# Patient Record
Sex: Female | Born: 1945 | Race: Black or African American | Hispanic: No | Marital: Single | Smoking: Never smoker
Health system: Southern US, Community
[De-identification: ages and names within clinical notes are randomized; demographics above are authoritative.]

## PROBLEM LIST (undated history)

## (undated) DIAGNOSIS — E119 Type 2 diabetes mellitus without complications: Secondary | ICD-10-CM

## (undated) DIAGNOSIS — Z86718 Personal history of other venous thrombosis and embolism: Secondary | ICD-10-CM

## (undated) DIAGNOSIS — I10 Essential (primary) hypertension: Secondary | ICD-10-CM

## (undated) HISTORY — PX: TUBAL LIGATION: SHX77

---

## 2010-01-23 IMAGING — US US SOFT TISSUE HEAD/NECK
1 series · 13 of 25 positions shown · non-contrast
Comparison: [HOSPITAL] at [HOSPITAL] thyroid
ultrasound 06/16/2008.

CLINICAL DATA: Follow-up thyroid nodule

THYROID ULTRASOUND
TECHNIQUE: Ultrasound examination of the thyroid gland and
adjacent soft tissues was performed.

[Series 1: us soft tissue head/neck · 0.07mm/px · 13 of 27 slices shown]
[im 1/27]
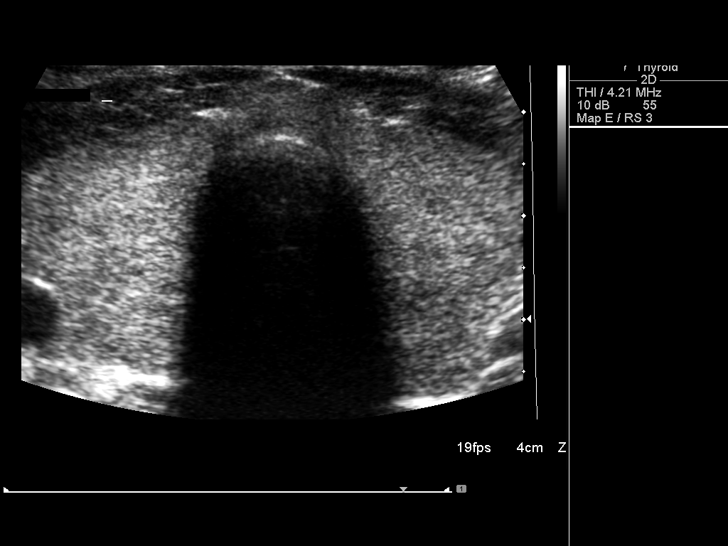
[im 3/27]
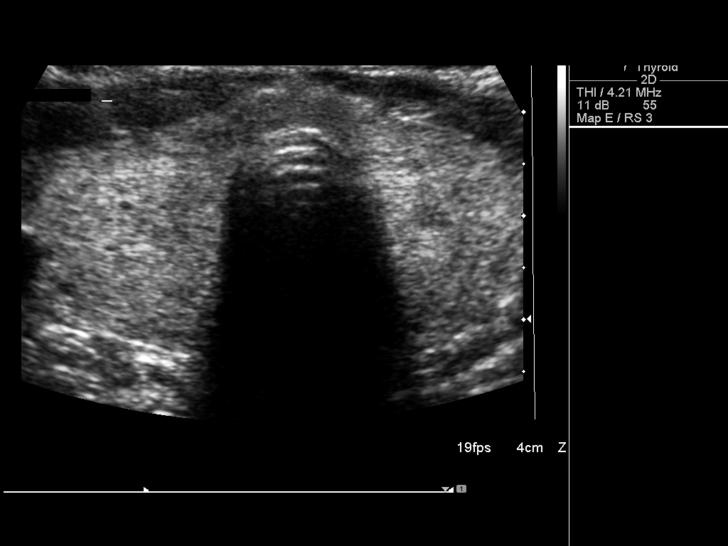
[im 5/27]
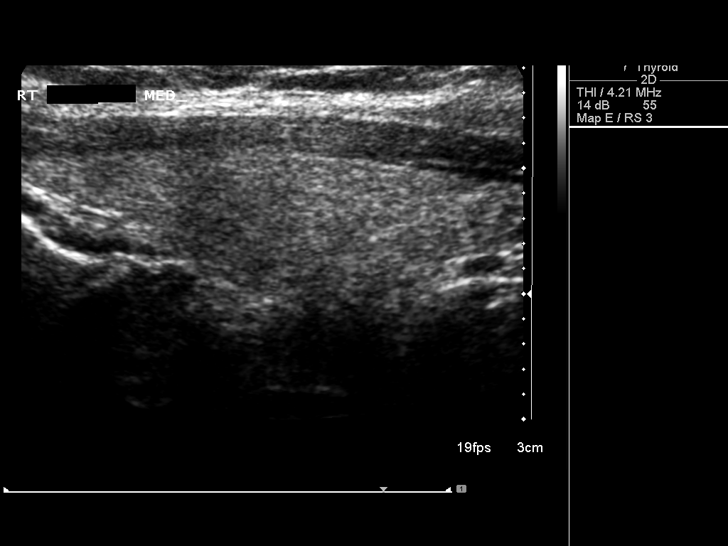
[im 7/27]
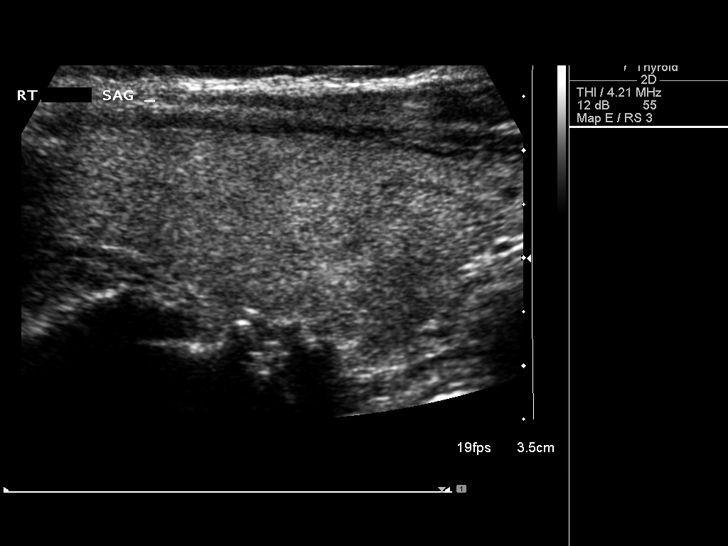
[im 9/27]
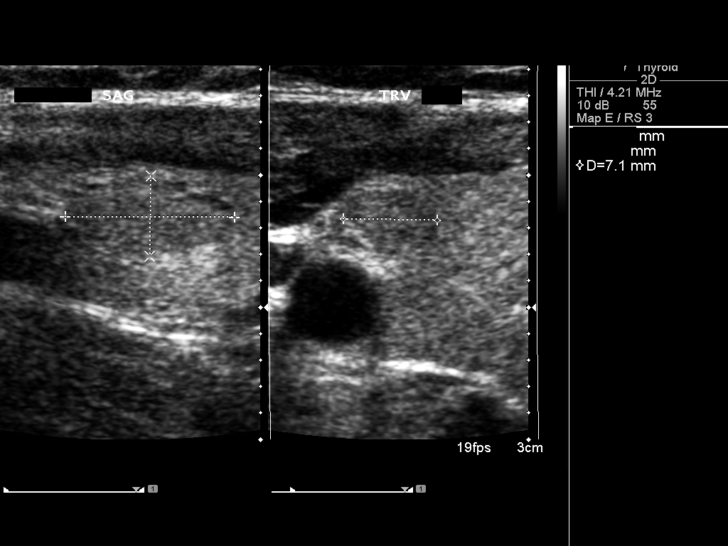
[im 11/27]
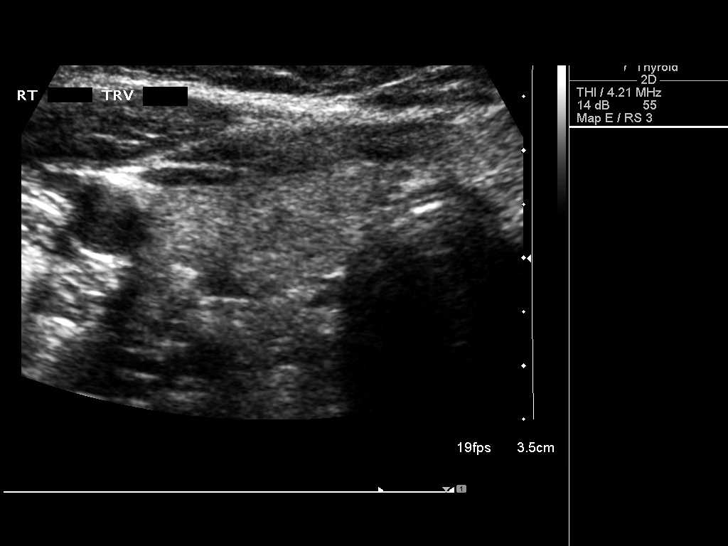
[im 14/27]
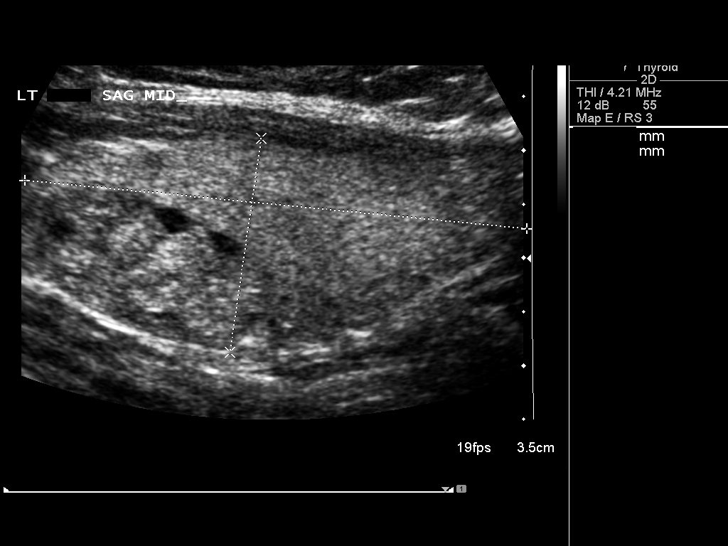
[im 16/27]
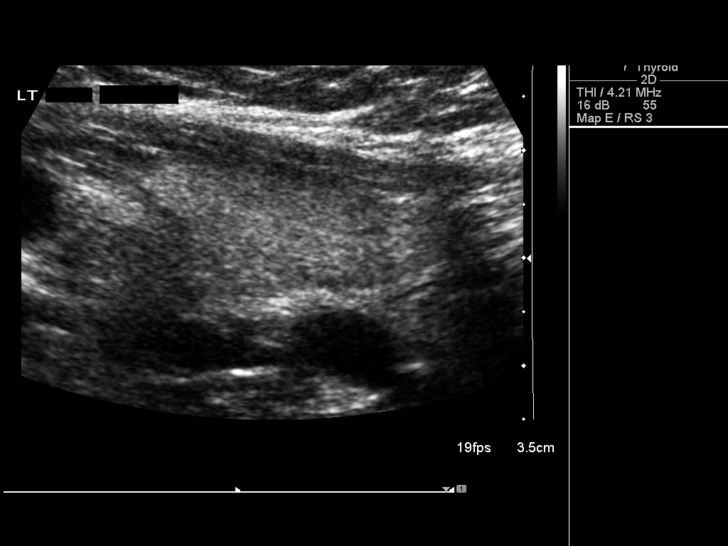
[im 18/27]
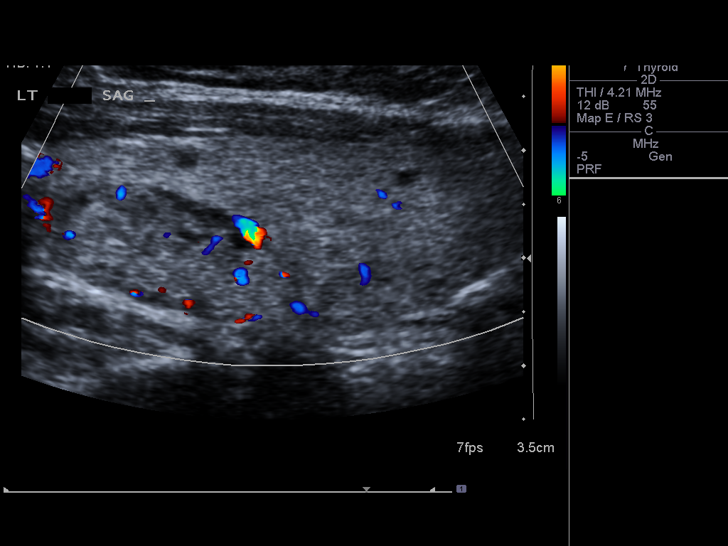
[im 20/27]
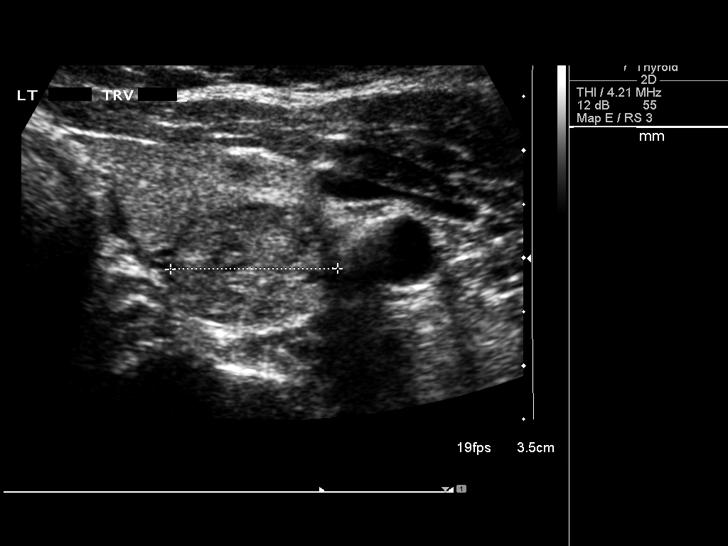
[im 22/27]
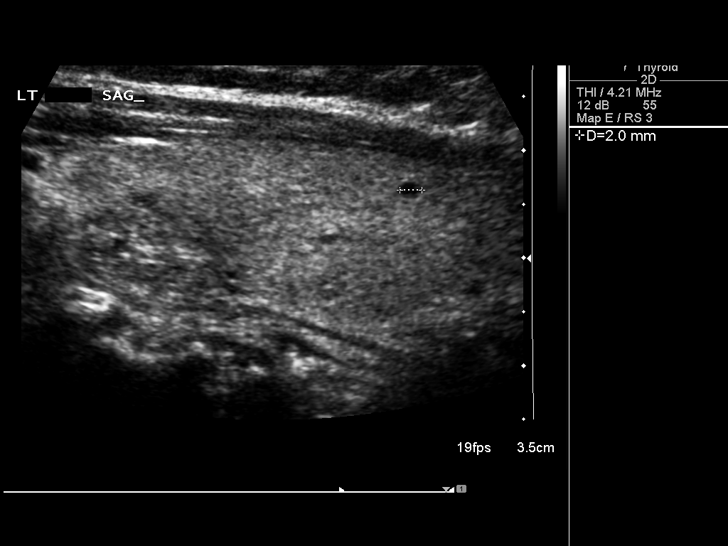
[im 24/27]
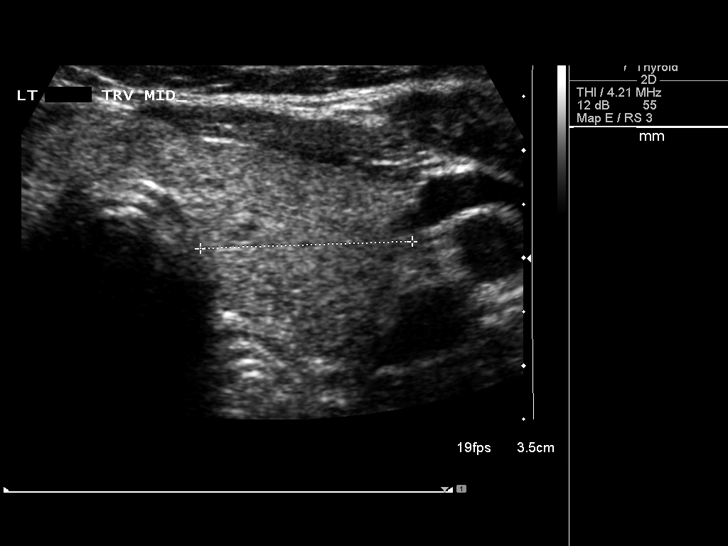
[im 27/27]
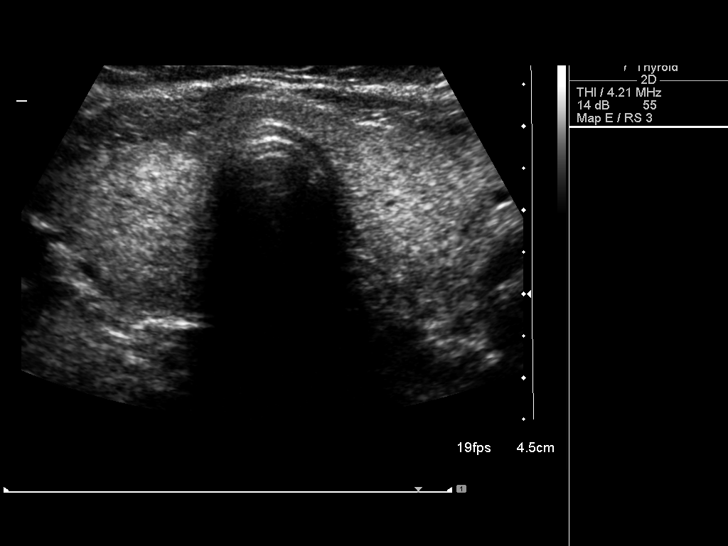

[13 of 25 positions shown; findings below may reference images not displayed]

FINDINGS: Thyroid gland remains stable and upper limits of normal
in size with diffuse heterogeneous echotexture.  Right lobe
measures 5.2 cm long X 1.6 cm AP X 2.2 cm wide (06/16/2008 5.1 X
1.8 X 2.2 cm).  Left lobe currently measures 4.7 cm long X 2.0 cm
AP X 2.0 cm wide (06/16/2008 4.7 X 2.0 X 2.0 cm.  Isthmus measures
5 mm AP (previous 4 mm).  The dominant upper pole left lobe thyroid
nodule measures 2.7 cm long X 1.1 cm AP X 1.6 cm wide (06/16/2008
2.1 X 1.0 X 1.5 cm).   At the mid right lobe thyroid is interval
solid nodule measuring 1.3 cm long X 0.6 cm AP X 0.7 cm wide with
previous 5 mm right lobe cystic nodule currently not identified.
IMPRESSION: 1.  Heterogeneous thyroid gland upper limits of normal in size
consistent with chronic thyroiditis - need clinical correlation.
2.  Essentially stable left lobe thyroid solid nodule with interval
development solid nodule right lobe as described.  Recommend follow-
up thyroid ultrasound in 1 year as clinically indicated

## 2022-07-23 ENCOUNTER — Inpatient Hospital Stay
Admission: EM | Admit: 2022-07-23 | Discharge: 2022-08-15 | DRG: 872 | Disposition: A | Discharge status: Home Health | Payer: Medicare HMO | Attending: Internal Medicine | Admitting: Internal Medicine

## 2022-07-23 ENCOUNTER — Emergency Department: Payer: Medicare HMO

## 2022-07-23 DIAGNOSIS — E785 Hyperlipidemia, unspecified: Secondary | ICD-10-CM | POA: Diagnosis present

## 2022-07-23 DIAGNOSIS — K219 Gastro-esophageal reflux disease without esophagitis: Secondary | ICD-10-CM | POA: Diagnosis present

## 2022-07-23 DIAGNOSIS — Z5986 Financial insecurity: Secondary | ICD-10-CM

## 2022-07-23 DIAGNOSIS — I10 Essential (primary) hypertension: Secondary | ICD-10-CM | POA: Diagnosis present

## 2022-07-23 DIAGNOSIS — K220 Achalasia of cardia: Secondary | ICD-10-CM | POA: Diagnosis present

## 2022-07-23 DIAGNOSIS — E1165 Type 2 diabetes mellitus with hyperglycemia: Secondary | ICD-10-CM | POA: Diagnosis present

## 2022-07-23 DIAGNOSIS — L8992 Pressure ulcer of unspecified site, stage 2: Secondary | ICD-10-CM | POA: Diagnosis present

## 2022-07-23 DIAGNOSIS — N390 Urinary tract infection, site not specified: Secondary | ICD-10-CM | POA: Diagnosis present

## 2022-07-23 DIAGNOSIS — G8929 Other chronic pain: Secondary | ICD-10-CM | POA: Diagnosis present

## 2022-07-23 DIAGNOSIS — D6859 Other primary thrombophilia: Secondary | ICD-10-CM | POA: Diagnosis present

## 2022-07-23 DIAGNOSIS — Z79899 Other long term (current) drug therapy: Secondary | ICD-10-CM

## 2022-07-23 DIAGNOSIS — E440 Moderate protein-calorie malnutrition: Secondary | ICD-10-CM | POA: Diagnosis present

## 2022-07-23 DIAGNOSIS — I214 Non-ST elevation (NSTEMI) myocardial infarction: Secondary | ICD-10-CM | POA: Diagnosis present

## 2022-07-23 DIAGNOSIS — Z86711 Personal history of pulmonary embolism: Secondary | ICD-10-CM

## 2022-07-23 DIAGNOSIS — E876 Hypokalemia: Secondary | ICD-10-CM | POA: Diagnosis present

## 2022-07-23 DIAGNOSIS — R627 Adult failure to thrive: Secondary | ICD-10-CM

## 2022-07-23 DIAGNOSIS — Z1152 Encounter for screening for COVID-19: Secondary | ICD-10-CM

## 2022-07-23 DIAGNOSIS — E1140 Type 2 diabetes mellitus with diabetic neuropathy, unspecified: Secondary | ICD-10-CM | POA: Diagnosis present

## 2022-07-23 DIAGNOSIS — I2489 Other forms of acute ischemic heart disease: Secondary | ICD-10-CM | POA: Diagnosis present

## 2022-07-23 DIAGNOSIS — R000 Tachycardia, unspecified: Secondary | ICD-10-CM

## 2022-07-23 DIAGNOSIS — A419 Sepsis, unspecified organism: Principal | ICD-10-CM | POA: Diagnosis present

## 2022-07-23 DIAGNOSIS — Z86718 Personal history of other venous thrombosis and embolism: Secondary | ICD-10-CM

## 2022-07-23 DIAGNOSIS — I82452 Acute embolism and thrombosis of left peroneal vein: Secondary | ICD-10-CM | POA: Diagnosis present

## 2022-07-23 DIAGNOSIS — K5900 Constipation, unspecified: Secondary | ICD-10-CM | POA: Diagnosis present

## 2022-07-23 DIAGNOSIS — N210 Calculus in bladder: Secondary | ICD-10-CM | POA: Diagnosis present

## 2022-07-23 DIAGNOSIS — Z7982 Long term (current) use of aspirin: Secondary | ICD-10-CM

## 2022-07-23 DIAGNOSIS — Z7401 Bed confinement status: Secondary | ICD-10-CM

## 2022-07-23 DIAGNOSIS — I071 Rheumatic tricuspid insufficiency: Secondary | ICD-10-CM | POA: Diagnosis present

## 2022-07-23 DIAGNOSIS — D531 Other megaloblastic anemias, not elsewhere classified: Secondary | ICD-10-CM | POA: Diagnosis present

## 2022-07-23 DIAGNOSIS — I82412 Acute embolism and thrombosis of left femoral vein: Secondary | ICD-10-CM | POA: Diagnosis present

## 2022-07-23 DIAGNOSIS — L89899 Pressure ulcer of other site, unspecified stage: Secondary | ICD-10-CM

## 2022-07-23 DIAGNOSIS — M1990 Unspecified osteoarthritis, unspecified site: Secondary | ICD-10-CM | POA: Diagnosis present

## 2022-07-23 DIAGNOSIS — I82432 Acute embolism and thrombosis of left popliteal vein: Secondary | ICD-10-CM | POA: Diagnosis present

## 2022-07-23 DIAGNOSIS — N200 Calculus of kidney: Secondary | ICD-10-CM | POA: Diagnosis present

## 2022-07-23 DIAGNOSIS — R32 Unspecified urinary incontinence: Secondary | ICD-10-CM

## 2022-07-23 DIAGNOSIS — Z7901 Long term (current) use of anticoagulants: Secondary | ICD-10-CM

## 2022-07-23 DIAGNOSIS — I2510 Atherosclerotic heart disease of native coronary artery without angina pectoris: Secondary | ICD-10-CM | POA: Diagnosis present

## 2022-07-23 DIAGNOSIS — I452 Bifascicular block: Secondary | ICD-10-CM | POA: Diagnosis present

## 2022-07-23 DIAGNOSIS — Z6841 Body Mass Index (BMI) 40.0 and over, adult: Secondary | ICD-10-CM

## 2022-07-23 HISTORY — DX: Type 2 diabetes mellitus without complications: E11.9

## 2022-07-23 HISTORY — DX: Essential (primary) hypertension: I10

## 2022-07-23 HISTORY — DX: Personal history of other venous thrombosis and embolism: Z86.718

## 2022-07-23 LAB — CBC AND DIFFERENTIAL
Absolute NRBC: 0.02 10*3/uL — ABNORMAL HIGH (ref 0.00–0.00)
Basophils Absolute Automated: 0.03 10*3/uL (ref 0.00–0.08)
Basophils Automated: 0.3 %
Eosinophils Absolute Automated: 0.19 10*3/uL (ref 0.00–0.44)
Eosinophils Automated: 1.7 %
Hematocrit: 38.5 % (ref 34.7–43.7)
Hgb: 12.1 g/dL (ref 11.4–14.8)
Immature Granulocytes Absolute: 0.06 10*3/uL (ref 0.00–0.07)
Immature Granulocytes: 0.5 %
Instrument Absolute Neutrophil Count: 9.26 10*3/uL — ABNORMAL HIGH (ref 1.10–6.33)
Lymphocytes Absolute Automated: 1.07 10*3/uL (ref 0.42–3.22)
Lymphocytes Automated: 9.6 %
MCH: 31 pg (ref 25.1–33.5)
MCHC: 31.4 g/dL — ABNORMAL LOW (ref 31.5–35.8)
MCV: 98.7 fL — ABNORMAL HIGH (ref 78.0–96.0)
MPV: 9.4 fL (ref 8.9–12.5)
Monocytes Absolute Automated: 0.55 10*3/uL (ref 0.21–0.85)
Monocytes: 4.9 %
Neutrophils Absolute: 9.26 10*3/uL — ABNORMAL HIGH (ref 1.10–6.33)
Neutrophils: 83 %
Nucleated RBC: 0.2 /100 WBC — ABNORMAL HIGH (ref 0.0–0.0)
Platelets: 461 10*3/uL — ABNORMAL HIGH (ref 142–346)
RBC: 3.9 10*6/uL (ref 3.90–5.10)
RDW: 20 % — ABNORMAL HIGH (ref 11–15)
WBC: 11.16 10*3/uL — ABNORMAL HIGH (ref 3.10–9.50)

## 2022-07-23 LAB — COMPREHENSIVE METABOLIC PANEL
ALT: 11 U/L (ref 0–55)
AST (SGOT): 16 U/L (ref 5–41)
Albumin/Globulin Ratio: 0.7 — ABNORMAL LOW (ref 0.9–2.2)
Albumin: 1.9 g/dL — ABNORMAL LOW (ref 3.5–5.0)
Alkaline Phosphatase: 76 U/L (ref 37–117)
Anion Gap: 11 (ref 5.0–15.0)
BUN: 12 mg/dL (ref 7.0–21.0)
Bilirubin, Total: 0.8 mg/dL (ref 0.2–1.2)
CO2: 24 mEq/L (ref 17–29)
Calcium: 8.5 mg/dL (ref 7.9–10.2)
Chloride: 108 mEq/L (ref 99–111)
Creatinine: 0.6 mg/dL (ref 0.4–1.0)
Globulin: 2.7 g/dL (ref 2.0–3.6)
Glucose: 220 mg/dL — ABNORMAL HIGH (ref 70–100)
Potassium: 3.7 mEq/L (ref 3.5–5.3)
Protein, Total: 4.6 g/dL — ABNORMAL LOW (ref 6.0–8.3)
Sodium: 143 mEq/L (ref 135–145)
eGFR: >60 mL/min/{1.73_m2} (ref >=60)

## 2022-07-23 LAB — CK: Creatine Kinase (CK): 43 U/L (ref 29–233)

## 2022-07-23 LAB — PT AND APTT
PT INR: 1.5 — ABNORMAL HIGH (ref 0.9–1.1)
PT: 17.3 s — ABNORMAL HIGH (ref 10.1–12.9)
PTT: 24 s — ABNORMAL LOW (ref 27–39)

## 2022-07-23 LAB — LACTIC ACID, PLASMA: Lactic Acid: 1.4 mmol/L (ref 0.2–2.0)

## 2022-07-23 LAB — COVID-19 (SARS-COV-2) & INFLUENZA  A/B, NAA (ROCHE LIAT)
Influenza A: NOT DETECTED
Influenza B: NOT DETECTED
SARS CoV 2 Overall Result: NOT DETECTED

## 2022-07-23 LAB — GLUCOSE WHOLE BLOOD - POCT: Whole Blood Glucose POCT: 214 mg/dL — ABNORMAL HIGH (ref 70–100)

## 2022-07-23 LAB — HIGH SENSITIVITY TROPONIN-I: hs Troponin-I: 92 ng/L — CR

## 2022-07-23 MED ORDER — ASPIRIN 81 MG PO CHEW
324.0000 mg | CHEWABLE_TABLET | Freq: Once | ORAL | Status: AC
Start: 2022-07-23 — End: 2022-07-23
  Administered 2022-07-23: 324 mg via ORAL
  Filled 2022-07-23: qty 4

## 2022-07-23 MED ORDER — VANCOMYCIN HCL IN NACL 1.5-0.9 GM/500ML-% IV SOLN
1500.0000 mg | Freq: Once | INTRAVENOUS | Status: DC
Start: 2022-07-23 — End: 2022-07-23

## 2022-07-23 MED ORDER — ACETAMINOPHEN 500 MG PO TABS
1000.0000 mg | ORAL_TABLET | Freq: Once | ORAL | Status: AC
Start: 2022-07-23 — End: 2022-07-23
  Administered 2022-07-23: 1000 mg via ORAL
  Filled 2022-07-23: qty 2

## 2022-07-23 MED ORDER — SODIUM CHLORIDE 0.9 % IV BOLUS
1000.0000 mL | Freq: Once | INTRAVENOUS | Status: AC
Start: 2022-07-23 — End: 2022-07-24
  Administered 2022-07-23: 1000 mL via INTRAVENOUS

## 2022-07-23 MED ORDER — STERILE WATER FOR INJECTION IJ/IV SOLN (WRAP)
4.5000 g | Freq: Once | INTRAVENOUS | Status: AC
Start: 2022-07-23 — End: 2022-07-23
  Administered 2022-07-23: 4.5 g via INTRAVENOUS
  Filled 2022-07-23: qty 20

## 2022-07-23 MED ORDER — VANCOMYCIN HCL 1 G IV SOLR
2000.0000 mg | Freq: Once | INTRAVENOUS | Status: AC
Start: 2022-07-23 — End: 2022-07-24
  Administered 2022-07-23: 2000 mg via INTRAVENOUS
  Filled 2022-07-23: qty 2000

## 2022-07-23 NOTE — ED Notes (Signed)
Bed: GR5  Expected date: 07/23/22  Expected time: 3:28 PM  Means of arrival: Ambulance  Comments:  A [phone]

## 2022-07-23 NOTE — ED Provider Notes (Signed)
EMERGENCY DEPARTMENT HISTORY AND PHYSICAL EXAM      Patient Name: Meredith Baker  Age: 76 y.o. female  Encounter Date:  07/23/2022  Department:[facility]  Patient Room: GR5/GR5  PCP: Pcp, None, MD       History of Presenting Illness     Chief Complaint   Patient presents with    Failure To Thrive       History Provided By: {SAHPI_1:23370}    History obtained from a source other than the patient: {Yes/No/NA:58344}. Why: To obtain information in addition to that relayed by the patient.***    Meredith Baker is a 76 y.o. female with ***     I reviewed patient's last ED visit, clinic visit or admission/discharge summary, as well as associated recent EKGs, lab or imaging results, if applicable.     Review of Systems     Please refer to HPI for pertinent positives and negatives.     Physical Exam   BP 173/77   Pulse 97   Temp 99 F (37.2 C) (Oral)   Resp 18   Wt 100.3 kg   SpO2 97%     Physical Exam      Medical Decision Making   I am the first provider for this patient.    I reviewed the vital signs, available nursing notes, allergies, past medical history, past surgical history, family history and social history. If pertinent, they are mentioned in HPI.     Personal Protective Equipment (PPE)  Gloves, surgical hat and surgical mask.    Provider Notes/Summary:     76 y.o. female with ***     Pulse Oximetry Analysis:  Interpreted by me. ***% on *** - {PulseOx charting:47805}  Cardiac Monitor: Interpreted by me. Rhythm:  {Rhythm:16023332}, Rate:  {Rate:16023334}, Ectopy:  {Ectopy:16023333}    EKG: Interpreted by me, the Emergency Physician.   Time Interpreted: ***  Rate: ***  Rhythm: {EKG RHYTHM :29264}  Interpretation: QTc ***, no ST elevations or TWIs  Comparison: {EKG COMPARISON :29265}    Labs: All labs have been ordered, reviewed and interpreted by me. See Provider Notes/Summary section for discussion. ***  Xrays: Ordered, reviewed and interpreted by me, confirmed by radiology report. See Provider  Notes/Summary section for discussion. ***  CT/US/MRI, as applicable: Ordered and reviewed  by me, confirmed by radiology report. See Provider Notes/Summary section for discussion.***    The patient and/or family is/are aware that today's emergency department evaluation has limitations and is only a screening that can be falsely reassuring.  We discussed the need for follow up and strict return precautions. Patient and/or family demonstrate verbal understanding that they can return to the emergency department at any given time if they are having worsening symptoms, other complaints or difficulty with followup.***  __________________________________________________________________    Clinical Decision Support:   {TIP  Decision Support:55325}    Critical Care Time:       Procedures:       For Hospitalized Patients:    Hospitalization Decision Time:    I have discussed this case with Dr. Marland Kitchen {AdmittingService:53790} at *** on ***, who accepts patient for admission and requests {Obs vs Inpatient:53791} {Dispo Unit:53792} bed.     For Surgical/Procedural Admissions:    Anticoagulated: {YES/NO:21936}. If yes, name of medication: ***  Last PO intake: ***  Current NPO status: ***     Consultant(s):     I have discussed this case with consultant Dr. Marland Kitchen at *** on ***.  Recommendations were as  follows: ***      Core Measures:     - 12-lead EKG was performed in the ED. Aspirin: {GRAFASPIRIN:39611}.    SEP-1 Charting   ***    ED Course:       Diagnosis     Clinical Impression: No diagnosis found. {TIP  Disposition:55325}    Disposition:   ED Disposition       None            The above diagnostic process was due to medical necessity based on risk stratification of potential harm of patient's presenting complaint.     CHART OWNERSHIP: This note is prepared by Enis Gash, MD, PHD, FACEP. I am the first provider for this patient.    This note was generated by the Epic EMR system/ Dragon speech recognition and may contain  inherent errors or omissions not intended by the user. Grammatical errors, random word insertions, deletions and pronoun errors  are occasional consequences of this technology due to software limitations. Not all errors are caught or corrected. If there are questions or concerns about the content of this note or information contained within the body of this dictation they should be addressed directly with the author for clarification.    Electronically signed by Enis Gash, MD, PHD, [place]

## 2022-07-23 NOTE — ED Triage Notes (Signed)
Patient presents to the ED from home where she lives with her boyfriend. Per niece patients boyfriend is unable to care for patient at home anymore. Patient is bed bound and incontinent. Presents to the ED complaining of right knee pain. She has bed sores on the left abdomen. A/Ox4.

## 2022-07-24 ENCOUNTER — Emergency Department: Payer: Medicare HMO

## 2022-07-24 ENCOUNTER — Inpatient Hospital Stay: Payer: Medicare HMO

## 2022-07-24 DIAGNOSIS — I214 Non-ST elevation (NSTEMI) myocardial infarction: Secondary | ICD-10-CM | POA: Diagnosis present

## 2022-07-24 LAB — COMPREHENSIVE METABOLIC PANEL
ALT: 7 U/L (ref 0–55)
AST (SGOT): 12 U/L (ref 5–41)
Albumin/Globulin Ratio: 0.7 — ABNORMAL LOW (ref 0.9–2.2)
Albumin: 1.7 g/dL — ABNORMAL LOW (ref 3.5–5.0)
Alkaline Phosphatase: 69 U/L (ref 37–117)
Anion Gap: 10 (ref 5.0–15.0)
BUN: 13 mg/dL (ref 7.0–21.0)
Bilirubin, Total: 0.8 mg/dL (ref 0.2–1.2)
CO2: 24 mEq/L (ref 17–29)
Calcium: 8.6 mg/dL (ref 7.9–10.2)
Chloride: 109 mEq/L (ref 99–111)
Creatinine: 0.7 mg/dL (ref 0.4–1.0)
Globulin: 2.6 g/dL (ref 2.0–3.6)
Glucose: 175 mg/dL — ABNORMAL HIGH (ref 70–100)
Potassium: 3.4 mEq/L — ABNORMAL LOW (ref 3.5–5.3)
Protein, Total: 4.3 g/dL — ABNORMAL LOW (ref 6.0–8.3)
Sodium: 143 mEq/L (ref 135–145)
eGFR: >60 mL/min/{1.73_m2} (ref >=60)

## 2022-07-24 LAB — HIGH SENSITIVITY TROPONIN-I WITH DELTA: hs Troponin-I: 108.5 ng/L — CR

## 2022-07-24 LAB — URINALYSIS REFLEX TO MICROSCOPIC EXAM - REFLEX TO CULTURE
Bilirubin, UA: NEGATIVE
Glucose, UA: NEGATIVE
Ketones UA: NEGATIVE
Nitrite, UA: NEGATIVE
Specific Gravity UA: 1.026 (ref 1.001–1.035)
Urine pH: 6 (ref 5.0–8.0)
Urobilinogen, UA: NORMAL mg/dL (ref 0.2–2.0)

## 2022-07-24 LAB — CBC AND DIFFERENTIAL
Absolute NRBC: 0 10*3/uL (ref 0.00–0.00)
Basophils Absolute Automated: 0.06 10*3/uL (ref 0.00–0.08)
Basophils Automated: 0.5 %
Eosinophils Absolute Automated: 0.37 10*3/uL (ref 0.00–0.44)
Eosinophils Automated: 3.1 %
Hematocrit: 33.2 % — ABNORMAL LOW (ref 34.7–43.7)
Hgb: 10.2 g/dL — ABNORMAL LOW (ref 11.4–14.8)
Immature Granulocytes Absolute: 0.07 10*3/uL (ref 0.00–0.07)
Immature Granulocytes: 0.6 %
Instrument Absolute Neutrophil Count: 9.07 10*3/uL — ABNORMAL HIGH (ref 1.10–6.33)
Lymphocytes Absolute Automated: 1.39 10*3/uL (ref 0.42–3.22)
Lymphocytes Automated: 11.7 %
MCH: 31 pg (ref 25.1–33.5)
MCHC: 30.7 g/dL — ABNORMAL LOW (ref 31.5–35.8)
MCV: 100.9 fL — ABNORMAL HIGH (ref 78.0–96.0)
MPV: 9.9 fL (ref 8.9–12.5)
Monocytes Absolute Automated: 0.95 10*3/uL — ABNORMAL HIGH (ref 0.21–0.85)
Monocytes: 8 %
Neutrophils Absolute: 9.07 10*3/uL — ABNORMAL HIGH (ref 1.10–6.33)
Neutrophils: 76.1 %
Nucleated RBC: 0 /100 WBC (ref 0.0–0.0)
Platelets: 377 10*3/uL — ABNORMAL HIGH (ref 142–346)
RBC: 3.29 10*6/uL — ABNORMAL LOW (ref 3.90–5.10)
RDW: 20 % — ABNORMAL HIGH (ref 11–15)
WBC: 11.91 10*3/uL — ABNORMAL HIGH (ref 3.10–9.50)

## 2022-07-24 LAB — PT AND APTT
PT INR: 1.6 — ABNORMAL HIGH (ref 0.9–1.1)
PT: 19 s — ABNORMAL HIGH (ref 10.1–12.9)
PTT: 32 s (ref 27–39)

## 2022-07-24 LAB — ANTI-XA,UFH
Anti-Xa, UFH: 0.05 IU/mL
Anti-Xa, UFH: 0.82 IU/mL
Anti-Xa, UFH: <0.04 IU/mL

## 2022-07-24 LAB — HIGH SENSITIVITY TROPONIN-I: hs Troponin-I: 103.4 ng/L — CR

## 2022-07-24 LAB — LACTIC ACID, PLASMA: Lactic Acid: 2.6 mmol/L — ABNORMAL HIGH (ref 0.2–2.0)

## 2022-07-24 MED ORDER — ONDANSETRON 4 MG PO TBDP
4.0000 mg | ORAL_TABLET | ORAL | Status: AC | PRN
Start: 2022-07-24 — End: 2022-07-24

## 2022-07-24 MED ORDER — DEXTROSE 50 % IV SOLN
12.5000 g | INTRAVENOUS | Status: DC | PRN
Start: 2022-07-24 — End: 2022-07-25

## 2022-07-24 MED ORDER — CARBOXYMETHYLCELLULOSE SODIUM 0.5 % OP SOLN
1.0000 [drp] | Freq: Three times a day (TID) | OPHTHALMIC | Status: DC | PRN
Start: 2022-07-24 — End: 2022-08-15

## 2022-07-24 MED ORDER — NALOXONE HCL 0.4 MG/ML IJ SOLN (WRAP)
0.2000 mg | INTRAMUSCULAR | Status: DC | PRN
Start: 2022-07-24 — End: 2022-08-15

## 2022-07-24 MED ORDER — ACETAMINOPHEN 325 MG PO TABS
650.0000 mg | ORAL_TABLET | Freq: Three times a day (TID) | ORAL | Status: DC | PRN
Start: 2022-07-24 — End: 2022-08-15
  Administered 2022-08-02 – 2022-08-11 (×5): 650 mg via ORAL
  Filled 2022-07-24 (×5): qty 2

## 2022-07-24 MED ORDER — LOSARTAN POTASSIUM 25 MG PO TABS
ORAL_TABLET | Freq: Every day | ORAL | Status: DC
Start: 2022-07-24 — End: 2022-08-15
  Filled 2022-07-24 (×24): qty 4

## 2022-07-24 MED ORDER — HEPARIN (PORCINE) IN D5W 50-5 UNIT/ML-% IV SOLN (UNITS/KG/HR ONLY)
9.9700 [IU]/kg/h | INTRAVENOUS | Status: DC
Start: 2022-07-24 — End: 2022-07-24
  Administered 2022-07-24: 9.97 [IU]/kg/h via INTRAVENOUS
  Filled 2022-07-24: qty 500

## 2022-07-24 MED ORDER — MELATONIN 3 MG PO TABS
3.0000 mg | ORAL_TABLET | Freq: Every evening | ORAL | Status: DC | PRN
Start: 2022-07-24 — End: 2022-08-15

## 2022-07-24 MED ORDER — DEXTROSE 10 % IV BOLUS
12.5000 g | INTRAVENOUS | Status: DC | PRN
Start: 2022-07-24 — End: 2022-07-25

## 2022-07-24 MED ORDER — SODIUM CHLORIDE 0.9 % IV MBP
4.5000 g | Freq: Four times a day (QID) | INTRAVENOUS | Status: DC
Start: 2022-07-24 — End: 2022-07-26
  Administered 2022-07-24 – 2022-07-26 (×10): 4.5 g via INTRAVENOUS
  Filled 2022-07-24 (×10): qty 20

## 2022-07-24 MED ORDER — ACETAMINOPHEN 325 MG PO TABS
650.0000 mg | ORAL_TABLET | Freq: Four times a day (QID) | ORAL | Status: DC | PRN
Start: 2022-07-24 — End: 2022-07-24

## 2022-07-24 MED ORDER — AMLODIPINE BESYLATE 5 MG PO TABS
5.0000 mg | ORAL_TABLET | Freq: Every day | ORAL | Status: DC
Start: 2022-07-24 — End: 2022-07-24
  Administered 2022-07-24: 5 mg via ORAL
  Filled 2022-07-24: qty 1

## 2022-07-24 MED ORDER — ONDANSETRON HCL 4 MG/2ML IJ SOLN
4.0000 mg | INTRAMUSCULAR | Status: AC | PRN
Start: 2022-07-24 — End: 2022-07-24

## 2022-07-24 MED ORDER — DABIGATRAN ETEXILATE MESYLATE 150 MG PO CAPS
150.0000 mg | ORAL_CAPSULE | Freq: Two times a day (BID) | ORAL | Status: DC
Start: 2022-07-24 — End: 2022-07-24
  Filled 2022-07-24 (×2): qty 1

## 2022-07-24 MED ORDER — SALINE SPRAY 0.65 % NA SOLN
2.0000 | NASAL | Status: DC | PRN
Start: 2022-07-24 — End: 2022-08-15

## 2022-07-24 MED ORDER — METOPROLOL SUCCINATE ER 50 MG PO TB24
200.0000 mg | ORAL_TABLET | Freq: Every day | ORAL | Status: DC
Start: 2022-07-24 — End: 2022-07-25
  Administered 2022-07-24 – 2022-07-25 (×2): 200 mg via ORAL
  Filled 2022-07-24 (×2): qty 4

## 2022-07-24 MED ORDER — HEPARIN SODIUM (PORCINE) 5000 UNIT/ML IJ SOLN
4000.0000 [IU] | Freq: Once | INTRAMUSCULAR | Status: AC
Start: 2022-07-24 — End: 2022-07-24
  Administered 2022-07-24: 4000 [IU] via INTRAVENOUS
  Filled 2022-07-24: qty 1

## 2022-07-24 MED ORDER — MORPHINE SULFATE 2 MG/ML IJ/IV SOLN (WRAP)
2.0000 mg | Status: AC | PRN
Start: 2022-07-24 — End: 2022-07-24

## 2022-07-24 MED ORDER — ASPIRIN 81 MG PO CHEW
81.0000 mg | CHEWABLE_TABLET | Freq: Every day | ORAL | Status: DC
Start: 2022-07-24 — End: 2022-08-15
  Administered 2022-07-24 – 2022-08-15 (×23): 81 mg via ORAL
  Filled 2022-07-24 (×24): qty 1

## 2022-07-24 MED ORDER — DABIGATRAN ETEXILATE MESYLATE 150 MG PO CAPS
150.0000 mg | ORAL_CAPSULE | Freq: Two times a day (BID) | ORAL | Status: DC
Start: 2022-07-24 — End: 2022-07-27
  Administered 2022-07-24 – 2022-07-26 (×5): 150 mg via ORAL
  Filled 2022-07-24 (×6): qty 1

## 2022-07-24 MED ORDER — POTASSIUM & SODIUM PHOSPHATES 280-160-250 MG PO PACK
2.0000 | PACK | ORAL | Status: DC | PRN
Start: 2022-07-24 — End: 2022-07-26

## 2022-07-24 MED ORDER — SODIUM CHLORIDE 0.9 % IV SOLN
INTRAVENOUS | Status: DC
Start: 2022-07-24 — End: 2022-07-25

## 2022-07-24 MED ORDER — POTASSIUM CHLORIDE CRYS ER 20 MEQ PO TBCR
0.0000 meq | EXTENDED_RELEASE_TABLET | ORAL | Status: DC | PRN
Start: 2022-07-24 — End: 2022-07-26
  Administered 2022-07-24: 40 meq via ORAL
  Filled 2022-07-24: qty 2

## 2022-07-24 MED ORDER — GLUCAGON 1 MG IJ SOLR (WRAP)
1.0000 mg | INTRAMUSCULAR | Status: DC | PRN
Start: 2022-07-24 — End: 2022-07-25

## 2022-07-24 MED ORDER — BENZOCAINE-MENTHOL MT LOZG (WRAP)
1.0000 | LOZENGE | OROMUCOSAL | Status: DC | PRN
Start: 2022-07-24 — End: 2022-08-15

## 2022-07-24 MED ORDER — BENZONATATE 100 MG PO CAPS
100.0000 mg | ORAL_CAPSULE | Freq: Three times a day (TID) | ORAL | Status: DC | PRN
Start: 2022-07-24 — End: 2022-08-15

## 2022-07-24 MED ORDER — POTASSIUM CHLORIDE 10 MEQ/100ML IV SOLN
10.0000 meq | INTRAVENOUS | Status: DC | PRN
Start: 2022-07-24 — End: 2022-07-26

## 2022-07-24 MED ORDER — VANCOMYCIN PHARMACY TO DOSE PLACEHOLDER
INTRAVENOUS | Status: DC
Start: 2022-07-24 — End: 2022-07-26

## 2022-07-24 MED ORDER — VANCOMYCIN HCL IN NACL 750-0.9 MG/250ML-% IV SOLN
750.0000 mg | Freq: Two times a day (BID) | INTRAVENOUS | Status: DC
Start: 2022-07-24 — End: 2022-07-26
  Administered 2022-07-24 – 2022-07-25 (×4): 750 mg via INTRAVENOUS
  Filled 2022-07-24 (×4): qty 750
  Filled 2022-07-24: qty 250

## 2022-07-24 MED ORDER — MAGNESIUM SULFATE IN D5W 1-5 GM/100ML-% IV SOLN
1.0000 g | INTRAVENOUS | Status: DC | PRN
Start: 2022-07-24 — End: 2022-07-26

## 2022-07-24 MED ORDER — GLUCOSE 40 % PO GEL (WRAP)
15.0000 g | ORAL | Status: DC | PRN
Start: 2022-07-24 — End: 2022-07-25

## 2022-07-24 MED ORDER — SODIUM CHLORIDE 0.9 % IV MBP
4.5000 g | Freq: Three times a day (TID) | INTRAVENOUS | Status: DC
Start: 2022-07-24 — End: 2022-07-24

## 2022-07-24 NOTE — Consults (Signed)
PROGRESS NOTE    Date Time: 07/24/22 12:11 PM  Patient Name: Meredith Baker, Meredith Baker    Patient seen at the request of Dr. Alfonse Spruce for evaluation mildly elevated troponin I level    Assessment:       Mildly elevated troponin I level; patient is chest pain-free.  Abnormal EKG; right bundle branch block and left anterior fascicular block.  Hypertension hypertensive cardiovascular disease.  Arthritis and being bed bound.  Left lower extremities laterally rotated; hip fracture cannot be excluded.    Plan:   Troponin I level to peak.  Lipid panel.  Thyroid function test.  CT chest with contrast rule out PE.  X-ray of the left hip.  Aspirin 81 mg p.o. once a day.  Atorvastatin 40 mg p.o. once a day.  Toprol-XL 50 mg twice a day.  Discontinue amlodipine.  Losartan /chlorothiazide.  Workup for CAD once medically stable.  Subjective/chief complaint:   Patient is seen and examined  All medications and labs  reviewed    HPI;  This patient is 76 year old lady, presented to the emergency department of [facility] on 07/23/2022 for chief complaint of failure to thrive.  Evaluation emergency department recorded blood pressure 173/77, heart rate 97, temperature 99 F and respiration 18.  Patient has history of hypertension, diabetes.  EKG shows sinus tachycardia, right bundle branch block and left anterior fascicular block.  No significant ST-T abnormalities.  Significant lab abnormalities included elevated blood sugar up to 220 and mildly elevated high-sensitivity troponin I level of 92.    At the time of this interview, patient denies chest pain and shortness of breath.  She complains of generalized weakness, lack of appetite and unable to ambulate.  She is requesting to be placed in a rehab.    Past medical history;  Hypertension  History of DVT    Medications: As listed     Current Facility-Administered Medications   Medication Dose Route Frequency    amLODIPine  5 mg Oral Daily    aspirin  81 mg Oral Daily    dabigatran  150 mg  Oral Q12H [place]    losartan (COZAAR) 100 mg, hydroCHLOROthiazide (HYDRODIURIL) 25 mg for HYZAAR   Oral Daily    metoprolol succinate  200 mg Oral Daily    piperacillin-tazobactam  4.5 g Intravenous Q6H    vancomycin  750 mg Intravenous Q12H    vancomycin   Intravenous See Admin Instructions     Social history;  Patient lives with her friend.  She does not smoke cigarettes.    Review of Systems:   General ROS: no weight loss, no weight gain, no fever, no chills  Hematological and Lymphatic ROS: negative  Endocrine ROS: no fatigue, no polyuria, no polydipsia, no general weakness  Respiratory ROS: no shortness of breath, no cough, no wheezes and no hemoptysis  Cardiovascular ROS: no chest pain, no palpitations, no PND and no orthopnea, no DOE  Gastrointestinal ROS:no nausea, no vomiting, no diarrhea, no constipation, no blood in stool and no abdominal pain  Musculoskeletal ROS: no muscle pain, +muscle weakness, +joint pain, no swelling and no redness  Neurological ROS: no headache, no dizziness, no diplopia, no focal weakness, no seizure  Dermatological ROS: no rash, no itching and no ecchymoses, no pressure ulcer      Physical Exam:   BP 123/73   Pulse 70   Temp 97.5 F (36.4 C) (Oral)   Resp 17   Ht 1.575 Meredith Baker ('5\' 2"'$ )   Wt 103.1 kg (227  lb 4.8 oz)   SpO2 95%   BMI 41.57 kg/Meredith Baker       Intake/Output Summary (Last 24 hours) at 07/24/2022 1211  Last data filed at 07/24/2022 1000  Gross per 24 hour   Intake 350 ml   Output --   Net 350 ml       General appearance - alert, ill appearing, and in no distress  Mental status - alert, oriented to person, place, and time  HEENT:normocephalic,  no icterus, no pallor, no cyanosis  Neck: supple, no JVD, no thyromegaly, no carotid bruits  Chest - clear to auscultation, no wheezes, rales or rhonchi, symmetric air entry  Heart - normal rate, regular rhythm, normal S1, S2, no S3 ,+murmurs, rubs, clicks or gallops  Abdomen - soft, nontender, nondistended, no masses or  organomegaly  Neurological - alert, oriented, normal speech, no focal findings  noted  Musculoskeletal -positive for joint tenderness, left lower extremity is laterally rotated.  Extremities - peripheral pulses palpable ,+pedal edema, no clubbing or cyanosis  Skin - normal color , no rashes.    Labs:     CBC w/Diff   Recent Labs   Lab 07/24/22  0638 07/23/22  1648   WBC 11.91* 11.16*   Hgb 10.2* 12.1   Hematocrit 33.2* 38.5   Platelets 377* 461*          Basic Metabolic Profile   Recent Labs   Lab 07/24/22  0638 07/23/22  1823   Sodium 143 143   Potassium 3.4* 3.7   Chloride 109 108   CO2 24 24   BUN 13.0 12.0   Creatinine 0.7 0.6   Calcium 8.6 8.5   ALT 7 11   AST (SGOT) 12 16   Glucose 175* 220*          Cardiac Enzymes   Recent Labs   Lab 07/24/22  0638 07/24/22  0026 07/23/22  2218 07/23/22  1648   Creatine Kinase (CK)  --   --  43  --    hs Troponin-I 103.4* 108.5*  --  92.0*   hs Troponin-I Delta  --  calc n/a  --   --        No results found for: "BNP"       Thyroid Studies          Invalid input(s): "FREET4"        Lipid Profile            Radiology Results (24 Hour)       Procedure Component Value Units Date/Time    CT Abd/Pelvis without Contrast [id number] Collected: 07/24/22 0137    Order Status: Completed Updated: 07/24/22 0145    Narrative:      CT ABDOMEN PELVIS WO IV/ WO PO CONT    CLINICAL INDICATION:   rule out infection    COMPARISON: None    TECHNIQUE: 5 mm axial images through the abdomen and pelvis without oral or  intravenous contrast administration with sagittal and coronal reformatted  images. The following  dose reduction techniques were utilized: automated  exposure control and/or adjustment of the mA and/or kV according to patient  size, and the use of iterative reconstruction technique.    FINDINGS:      LUNG BASES: There calcifications in the region of the mitral valve. There  is a small left pleural effusion. There is a small amount pericardial  fluid. There are areas of atelectasis  and scarring in the included lung  bases.  ABDOMEN: There are nonobstructing stones in the left renal collecting  system and left renal pelvis. Small exophytic right renal hypodensity has  density measurements suggestive of a cyst. There is nonspecific perinephric  stranding. The gallbladder is not seen. The liver, spleen, pancreas,  adrenal glands, and kidneys otherwise appear grossly unremarkable within  limitations of a noncontrasted study. There is no free fluid or free  intraperitoneal air in the abdomen. There are atherosclerotic  calcifications of the aorta and its branches as well as iliac and femoral  arteries.    PELVIS: There is no free pelvic fluid. There is hyperdensity in the  dependent bladder suggestive of small bladder stones. The urinary bladder  otherwise appears grossly unremarkable for degree of distention. Evaluation  of the small bowel and colon is limited given lack of oral contrast  opacification. The small bowel and colon appear grossly unremarkable within  the limitations of lack of oral contrast opacification. The appendix is not  definitively seen. Bone windows demonstrate osteopenia and degenerative  changes.      Impression:             1. Nonobstructing left renal stones.    2. Small bladder stones.    3. Additional incidental findings as described above.    Edison Simon, MD  07/24/2022 1:43 AM    CT Chest without Contrast [id number] Collected: 07/24/22 0137    Order Status: Completed Updated: 07/24/22 0142    Narrative:      Clinical History:    rule out infection    Technique:    CT CHEST WO CONTRAST axial CT scan of the chest was performed without  contrast as per departmental protocol. Coronal and sagittal reformatted  images were also submitted for review. The following dose reduction  techniques were utilized: automated exposure control and/or adjustment of  the mA and/or kV according to patient size, and the use of iterative  reconstruction technique.      Comparison:    None    Findings:    There is a small left pleural effusion with minimal bibasilar areas of  atelectasis. There is no focal consolidation. There is no pneumothorax. The  airways are patent. The main pulmonary trunk is enlarged measuring up to  3.7 cm in transverse dimension. The aorta is normal in caliber and  demonstrates scattered calcified atherosclerotic disease. The heart is  normal in size, with calcification of the mitral valve. There is a small  pericardial effusion. There is no mediastinal lymphadenopathy. There is an  ill-defined 2.6 x 2.0 x 3.5 cm low-attenuation lesion within the left  thyroid lobe. There is no mediastinal lymphadenopathy. The esophagus is  slightly patulous and fluid-filled.    Within the upper abdomen, there is a 1 cm nonobstructing left renal  calculus. The patient is status post cholecystectomy.    No acute osseous abnormalities are seen. There are degenerative changes of  the spine. The soft tissues are within normal limits.      Impression:          Small left pleural effusion, with minimal bibasilar areas of atelectasis.    No focal consolidation.    Slightly patulous and fluid-filled esophagus can be seen in the setting of  gastroesophageal reflux.    2.6 x 2.0 x 3.5 cm low-attenuation lesion within the left thyroid lobe.  Correlation with ultrasound is recommended for follow-up.    Enlarged main pulmonary trunk, suggestive of underlying pulmonary arterial  hypertension.    Status  post cholecystectomy.    Nonobstructing left renal calculus.    Timmie Foerster MD, MD  07/24/2022 1:40 AM    X-ray chest AP portable [id number] Collected: 07/23/22 2239    Order Status: Completed Updated: 07/23/22 2244    Narrative:      CLINICAL HISTORY:  Sepsis. Evaluate for pneumonia.    COMPARISON:  None.    TECHNIQUE:  Single portable AP radiograph of the chest.     FINDINGS:   Patient is rotated. Air-filled structure projecting over the lower  mediastinum, question hiatal  hernia, with air noted throughout the  esophagus. Small left pleural effusion and dense left retrocardiac airspace  opacity. Right lung is grossly clear. No pneumothorax. Cardiac silhouette  is mildly enlarged. Included upper abdomen is unremarkable.      Impression:         1. Small left pleural effusion. Dense left basilar airspace opacity  suspicious for pneumonia.  2. Air-filled structure projecting over the lower mediastinum suggestive of  hiatal hernia. Air also noted distending the esophagus. If indicated,  further assessment can be made with CT.    Denice Paradise, MD  07/23/2022 10:42 PM                  Jerilynn Mages Clarita Crane, MD, MD  07/24/2022 12:11 PM

## 2022-07-24 NOTE — H&P (Signed)
[facility]      Patient: Meredith Baker  Date: 07/23/2022   DOB: May 13, 1946  Date of Admission: 07/23/2022   MRN: [id number]  Attending: Charmaine Downs, MD         Chief Complaint   Patient presents with    Failure To Thrive        History Gathered From: Patient    HISTORY AND PHYSICAL     Meredith Baker is a 76 y.o. female with a PMHx of morbid obesity, type II DM, hypertension, lower extremity DVT on Pradaxa, bedbound, urinary incontinence and large superficial left iliac pressure wound brought to the ED for evaluation of generalized weakness.  Patient reports that she lives with her niece and her 47 years old uncle who has been taking care of her for the past 4 -5 years. but lately he has become increasingly weak and unable to care for her.  She reports that she has not eaten anything for the last 3 days.  Patient is requesting to be placed in a nursing home.  Currently denies fever, headache, cough, chest pain, shortness of breath, nausea, vomiting, orthopnea, PND, abdominal pain or diarrhea.  At the ED her vital signs were stable.  Review of blood work was significant for leukocytosis of 11.2, glucose of 220, lactic acid normal at 1.4, troponin 92 and subsequently went up to 108, INR of 1.5, tested negative for COVID-19 and influenza.  CT chest / Abd/ plevis without contrast showed evidence of GERD, small left pleural effusion and pulmonary arterial hypertension and nonobstructive renal calculus    Past Medical History:   Diagnosis Date    Diabetes mellitus     History of blood clots     Hypertension        Past Surgical History:   Procedure Laterality Date    TUBAL LIGATION         Prior to Admission medications    Medication Sig Start Date End Date Taking? Authorizing Provider   acetaminophen (TYLENOL) 500 MG tablet Take 1 tablet (500 mg) by mouth    [provider]   amLODIPine (NORVASC) 5 MG tablet Take 1 tablet (5 mg) by mouth daily    [provider]   dabigatran (PRADAXA)  150 MG Cap Take 1 capsule (150 mg) by mouth every 12 (twelve) hours    [provider]   Insulin NPH Isophane & Regular (HUMULIN 70/30 KWIKPEN SC) Inject into the skin    [provider]   losartan 100 MG TABS, hydroCHLOROthiazide 25 MG TABS Take by mouth daily    [provider]   metoprolol succinate (TOPROL-XL) 200 MG 24 hr tablet Take 1 tablet (200 mg) by mouth daily    [provider]   Multiple Vitamins-Minerals (CENTRUM SILVER 50+WOMEN PO) Take 1 tablet by mouth daily    [provider]   TURMERIC PO Take 1,200 mg by mouth daily    [provider]   valACYclovir (VALTREX) 1000 MG tablet Take 1 tablet (1,000 mg) by mouth daily    [provider]   vitamin D, ergocalciferol, (DRISDOL) 50000 UNIT Cap Take 1 capsule (50,000 Units) by mouth once a week    [provider]       No Known Allergies    History reviewed. No pertinent family history.    Social History     Tobacco Use    Smoking status: Never    Smokeless tobacco: Never   Vaping Use  Vaping Use: Never used   Substance Use Topics    Alcohol use: Never    Drug use: Never       REVIEW OF SYSTEMS   12 point review of systems was done and found to be negative except the ones mentioned in the HPI  PHYSICAL EXAM     Vital Signs (most recent): BP 134/80   Pulse 87   Temp 97.7 F (36.5 C) (Oral)   Resp 17   Wt 100.3 kg (221 lb 3.2 oz)   SpO2 96%   Constitutional: No apparent distress. Patient speaks freely in full sentences.  Morbidly obese  HEENT: NC/AT, PERRL, no scleral icterus or conjunctival pallor, no nasal discharge, DMM, scattered oral ulcer.    Neck: trachea midline, supple, no cervical or supraclavicular lymphadenopathy or masses  Cardiovascular: RRR, normal S1 S2, no murmurs, gallops, palpable thrills, no JVD, Non-displaced PMI.  Respiratory: Normal rate. No retractions or increased work of breathing. Clear to auscultation and percussion bilaterally.  Gastrointestinal: +BS,  non-distended, soft, non-tender, no rebound or guarding, no hepatosplenomegaly  Genitourinary: no suprapubic or costovertebral angle tenderness  Musculoskeletal: Large superficial left iliac pressure wound.  Asymmetric left lower extremity swelling   and radial pulses 2+ and symmetric.  Neurologic: EOMI, CN 2-12 grossly intact.   Psychiatric: AAOx3, affect and mood appropriate. The patient is alert, interactive, appropriate.    LABS & IMAGING     Recent Results (from the past 24 hour(s))   Glucose Whole Blood - POCT    Collection Time: 07/23/22  3:55 PM   Result Value Ref Range    Whole Blood Glucose POCT 214 (H) 70 - 100 mg/dL   CBC and differential    Collection Time: 07/23/22  4:48 PM   Result Value Ref Range    WBC 11.16 (H) 3.10 - 9.50 x10 3/uL    Hgb 12.1 11.4 - 14.8 g/dL    Hematocrit 38.5 34.7 - 43.7 %    Platelets 461 (H) 142 - 346 x10 3/uL    RBC 3.90 3.90 - 5.10 x10 6/uL    MCV 98.7 (H) 78.0 - 96.0 fL    MCH 31.0 25.1 - 33.5 pg    MCHC 31.4 (L) 31.5 - 35.8 g/dL    RDW 20 (H) 11 - 15 %    MPV 9.4 8.9 - 12.5 fL    Instrument Absolute Neutrophil Count 9.26 (H) 1.10 - 6.33 x10 3/uL    Neutrophils 83.0 None %    Lymphocytes Automated 9.6 None %    Monocytes 4.9 None %    Eosinophils Automated 1.7 None %    Basophils Automated 0.3 None %    Immature Granulocytes 0.5 None %    Nucleated RBC 0.2 (H) 0.0 - 0.0 /100 WBC    Neutrophils Absolute 9.26 (H) 1.10 - 6.33 x10 3/uL    Lymphocytes Absolute Automated 1.07 0.42 - 3.22 x10 3/uL    Monocytes Absolute Automated 0.55 0.21 - 0.85 x10 3/uL    Eosinophils Absolute Automated 0.19 0.00 - 0.44 x10 3/uL    Basophils Absolute Automated 0.03 0.00 - 0.08 x10 3/uL    Immature Granulocytes Absolute 0.06 0.00 - 0.07 x10 3/uL    Absolute NRBC 0.02 (H) 0.00 - 0.00 x10 3/uL   High Sensitivity Troponin-I at 0 hrs    Collection Time: 07/23/22  4:48 PM   Result Value Ref Range    hs Troponin-I 92.0 (AA) SEE BELOW ng/L   Comprehensive metabolic panel  Collection Time: 07/23/22   6:23 PM   Result Value Ref Range    Glucose 220 (H) 70 - 100 mg/dL    BUN 12.0 7.0 - 21.0 mg/dL    Creatinine 0.6 0.4 - 1.0 mg/dL    Sodium 143 135 - 145 mEq/L    Potassium 3.7 3.5 - 5.3 mEq/L    Chloride 108 99 - 111 mEq/L    CO2 24 17 - 29 mEq/L    Calcium 8.5 7.9 - 10.2 mg/dL    Protein, Total 4.6 (L) 6.0 - 8.3 g/dL    Albumin 1.9 (L) 3.5 - 5.0 g/dL    AST (SGOT) 16 5 - 41 U/L    ALT 11 0 - 55 U/L    Alkaline Phosphatase 76 37 - 117 U/L    Bilirubin, Total 0.8 0.2 - 1.2 mg/dL    Globulin 2.7 2.0 - 3.6 g/dL    Albumin/Globulin Ratio 0.7 (L) 0.9 - 2.2    Anion Gap 11.0 5.0 - 15.0    eGFR >60.0 >=60 mL/min/1.73 m2   Lactic Acid    Collection Time: 07/23/22 10:18 PM   Result Value Ref Range    Lactic Acid 1.4 0.2 - 2.0 mmol/L   PT/APTT    Collection Time: 07/23/22 10:18 PM   Result Value Ref Range    PT 17.3 (H) 10.1 - 12.9 sec    PT INR 1.5 (H) 0.9 - 1.1    PTT 24 (L) 27 - 39 sec   Creatine Kinase (CK)    Collection Time: 07/23/22 10:18 PM   Result Value Ref Range    Creatine Kinase (CK) 43 29 - 233 U/L   COVID-19 (SARS-CoV-2) and Influenza A/B, NAA ([facility] Rapid)    Collection Time: 07/23/22 10:18 PM    Specimen: Nasopharyngeal; Culturette   Result Value Ref Range    Purpose of COVID testing Diagnostic -PUI     SARS-CoV-2 Specimen Source Nasal Swab     SARS CoV 2 Overall Result Not Detected     Influenza A Not Detected     Influenza B Not Detected    High Sensitivity Troponin-I at 2 hrs with calculated Delta    Collection Time: 07/24/22 12:26 AM   Result Value Ref Range    hs Troponin-I 108.5 (AA) SEE BELOW ng/L    hs Troponin-I Delta calc n/a ng/L   Anti-Xa, UFH    Collection Time: 07/24/22  1:17 AM   Result Value Ref Range    Anti-Xa, UFH 0.05 See notes IU/mL   PT/APTT    Collection Time: 07/24/22  1:17 AM   Result Value Ref Range    PT 19.0 (H) 10.1 - 12.9 sec    PT INR 1.6 (H) 0.9 - 1.1    PTT 32 27 - 39 sec       MICROBIOLOGY:  Blood Culture: Pending  Urine Culture: Pending  Antibiotics Started:  Yes    IMAGING:  CT Abd/Pelvis without Contrast    Result Date: 07/24/2022   1. Nonobstructing left renal stones. 2. Small bladder stones. 3. Additional incidental findings as described above. Edison Simon, MD 07/24/2022 1:43 AM    CT Chest without Contrast    Result Date: 07/24/2022  Small left pleural effusion, with minimal bibasilar areas of atelectasis. No focal consolidation. Slightly patulous and fluid-filled esophagus can be seen in the setting of gastroesophageal reflux. 2.6 x 2.0 x 3.5 cm low-attenuation lesion within the left thyroid lobe. Correlation with ultrasound is recommended  for follow-up. Enlarged main pulmonary trunk, suggestive of underlying pulmonary arterial hypertension. Status post cholecystectomy. Nonobstructing left renal calculus. Timmie Foerster MD, MD 07/24/2022 1:40 AM    X-ray chest AP portable    Result Date: 07/23/2022   1. Small left pleural effusion. Dense left basilar airspace opacity suspicious for pneumonia. 2. Air-filled structure projecting over the lower mediastinum suggestive of hiatal hernia. Air also noted distending the esophagus. If indicated, further assessment can be made with CT. Denice Paradise, MD 07/23/2022 10:42 PM      CARDIAC:  EKG Interpretation (upon my review):    Sinus tachycardia with a rate of 105  -RBBB, LAFB  Markers:  Recent Labs   Lab 07/24/22  0026 07/23/22  2218 07/23/22  1648   Creatine Kinase (CK)  --  43  --    hs Troponin-I 108.5*  --  92.0*   hs Troponin-I Delta calc n/a  --   --        EMERGENCY DEPARTMENT COURSE:  Orders Placed This Encounter   Procedures    Culture Blood Aerobic and Anaerobic    Culture Blood Aerobic and Anaerobic    COVID-19 (SARS-CoV-2) and Influenza A/B, NAA ([facility] Rapid)    X-ray chest AP portable    CT Chest without Contrast    CT Abd/Pelvis without Contrast    CBC and differential    Urinalysis Reflex to Microscopic Exam- Reflex to Culture    Comprehensive metabolic panel    High Sensitivity Troponin-I at 2 hrs with  calculated Delta    High Sensitivity Troponin-I at 0 hrs    Lactic Acid    Lactic Acid    PT/APTT    Urinalysis Reflex to Microscopic Exam- Reflex to Culture    Creatine Kinase (CK)    High Sensitivity Troponin-I    Anti-Xa, UFH    Anti-Xa, UFH    PT/APTT    Comprehensive metabolic panel    CBC and differential    Diet consistent carbohydrate    Start/Continue Sepsis Care Pathway    Notify physician If 2 successive anti-Xa <0.1 IU/mL    Notify physician (specify) Signs/Symptoms of Bleeding    Notify physician (specify) Allergic to Heparin/Pork Products    Assess if patient received anticoag last 12 hour    Apply Anticoagulation Arm Band    Education: Anticoagulation    Education: Heparin    Notify physician    Vital signs with SpO2    Bed rest    Vital signs    Pulse Oximetry    Progressive Mobility Protocol    Notify physician    I/O    Height    Weight    Skin assessment    Notify physician (Critical Blood Glucose Value)    Notify physician (Communication: Document Abnormal Blood Glucose)    POCT order (PRN hypoglycemia)    Adult Hypoglycemia Treatment Algorithm    Place sequential compression device    Maintain sequential compression device    Education: Activity    Education: Disease Process & Condition    Education: Pain Management    Education: Falls Risk    Education: Smoking Cessation    Full Code    ED Clerk Communication Order    Glucose Whole Blood - POCT    ECG 12 lead     Saline lock IV    Saline lock IV    Admit to Inpatient    BLEEDING PRECAUTIONS       ASSESSMENT &  PLAN     Meredith Baker is a 76 y.o. female admitted with NSTEMI (non-ST elevated myocardial infarction).    #Sepsis  -Patient has leukocytosis and tachycardia  -Source of infection not clear  -CT chest without contrast showed small left pleural effusion and evidence of pulm hypertension with no infiltrates  -Pending UA  -Lactic acid normal at 1.4  -IV fluid  -Empiric IV vancomycin/Zosyn  -Follow blood and urine culture and de-escalate  antibiotics accordingly    #Elevated troponin   -likely demand ischemia in the setting of sepsis  -Patient free of chest pain or shortness of breath  -Twelve-lead EKG with no acute ischemic changes  -Troponin 92--->108  -Patient given ASA 324 mg p.o. x1 and started on heparin drip per ACS protocol  -Continue with ASA 81 mg daily  -Telemetry monitoring, trend troponin  -Continue with heparin drip per ACS protocol and transition to home dose of Pradaxa in a.m.    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  -Now appears to have significant left lower extremity swelling  -Will obtain venous Doppler of the lower extremity  -Continue anticoagulation with Pradaxa      Other chronic stable conditions  #Hypertension:-Continue amlodipine, metoprolol and losartan    #Type II DM:-SSI, monitor fingersticks closely, hypoglycemia protocol    #Morbid obesity:-Outpatient sleep study    #Bedbound status:-With urinary incontinence and complicated by left iliac pressure wound  -Ostomy consult in place    -Patient no longer has support at home would like to be placed in a nursing home, case management consult in place              Nutrition: CHO consistent    DVT/VTE Prophylaxis:   Current Facility-Administered Medications (Includes Only Anticoagulants, Misc. Hematological)   Medication Dose Route Last Admin    dabigatran (PRADAXA) capsule 150 mg  150 mg Oral      heparin 25,000 units in dextrose 5% 500 mL infusion (premix)  9.97 Units/kg/hr Intravenous 9.97 Units/kg/hr at 07/24/22 0136       Code Status: Full Code    Patient Class: INPATIENT. Inpatient status is judged to be reasonable and necessary in order to provide the required intensity of service to ensure the patient's safety. The patient's presenting symptoms, physical exam findings, and initial radiographic and laboratory data in the context of their chronic comorbidities is felt to place them at high risk for further  clinical deterioration. Furthermore, it is not anticipated that the patient will be medically stable for discharge from the hospital within 2 midnights of admission. The following factors support the admission status of inpatient: the patient's presenting symptoms of generalized weakness, left iliac pressure wound, worrisome physical exam findings of left ear pressure ulcer, left lower extremity swelling, worrisome laboratory data of leukocytosis, and significant comorbidities including morbid obesity, bedbound status.    I certify that at the point of admission it is my clinical judgment that the patient will require inpatient hospital care spanning beyond 2 midnights from the point of admission due to high intensity of service, high risk for further deterioration and high frequency of surveillance required.     Anticipated medical stability for discharge: Greater than 48 Hours      Signed,  Charmaine Downs, MD    07/24/2022 3:28 AM  Time Elapsed: 1hr

## 2022-07-24 NOTE — Progress Notes (Addendum)
Brief Progress Note    76yo F with history of HTN, HLD, type 2 DM, lower extremity DVT on Pradaxa, bedbound for several months, presenting with failure to thrive and requesting placement. Labs notable for mild leukocytosis, macrocyitc anemia, mild hypokalemia, elevated troponin now downtrending, low serum albumin. She denies chest pain or shortness of breath leading up to her hospitalization.    Exam:  Gen: NAD  CVS: RRR, S1 and S2 normal, no murmurs  Lungs: CTABL anteriorly  Abd: Soft, NTND, NABS  Ext: 1/2+ pitting edema, L>R     Plan:   DVT US  Cardiology consult  Stop heparin drip  PT/OT eval  Case management consult  Currently on broad spectrum antibiotics, will de-escalate if cultures negative x48 hours.     Allegra Grana, MD    Addendum: I discussed Ms. Kritikos's previous living situation with her niece [place] at length.  Ms. Petteway previously resided in a senior living apartment.  For the past 4 to 5 months, she has been living with her friend, Cornelia Copa, where per Shantelle's report, she is bedbound laying on a very small hospital bed in [place]'s basement. She has been bedbound since falling on her knees in March 2023.     [place] suspects that Cornelia Copa has neglecting her aunt, not cleaning or feeding her, resulting in multiple bed sores. She suspects that Cornelia Copa deprives patient of her phone when he is frustrated, and that he does not come into the basement to administer medications or provide food. Benancio Deeds has tried herself to come and help clean her aunt, but is limited by distance to [employer]. She relayed that recently she came to the house to bring Thanksgiving dinner at 6pm to her aunt, and patient reported that she had not been fed since the previous day.     [place] called a wellness check on her aunt yesterday, due to Cornelia Copa not answering the door when she arrived to check on patient. Patient was subsequently transported to the hospital via EMS. It appears that in the past, patient has  been intent on returning to [place] because "she loves him," but is currently requesting placement to nursing home.     Discussed with case management.   Will ask psychiatry to weight on patient's capacity. She does have a living sister who is her next of kin (Shantelle's mother). She is unmarried and has no living parents or children.   Will plan on PT/OT eval and SNF referrals with hopeful long term care in the future

## 2022-07-24 NOTE — Plan of Care (Addendum)
NURSING SHIFT NOTE     Patient: Meredith Baker  Day: 0      SHIFT EVENTS     Shift Narrative/Significant Events (PRN med administration, fall, RRT, etc.):   PT  admitted in unit with NSTEMI. Pt on room air, NSR on tele. A&O x4. Pt denied any pain or  discomfort. Pt arrived on heparin gtts 9.97 units/kg/hr. Anti-xa this morning is 0.82. Per protocol hold for 60 min and decrease by 3 units/kg/hr.     Safety and fall precautions remain in place. Purposeful rounding completed.          ASSESSMENT     Changes in assessment from patient's baseline this shift:    Neuro: NO  CV: NO  Pulm: NO  Peripheral Vascular: NO  HEENT: NO  GI: NO  BM during shift: NO, Last BM:    GU: NO   Integ:  LEFT FLANK STAGE 2 PRESSURE INJURY  MS: NO    Pain: NO  Pain Interventions: NO  Medications Utilized: NO    Mobility: PMP Activity: Step 1 - Bedrest of             Lines     Patient Lines/Drains/Airways Status       Active Lines, Drains and Airways       Name Placement date Placement time Site Days    Peripheral IV 07/23/22 22 G Standard Right Antecubital 07/23/22  1646  Antecubital  less than 1    Peripheral IV 07/24/22 20 G Anterior;Left Forearm 07/24/22  0030  Forearm  less than 1    External Urinary Catheter 07/23/22  1602  --  less than 1                         VITAL SIGNS     Vitals:    07/24/22 0400   BP:    Pulse: 95   Resp:    Temp:    SpO2:        Temp  Min: 97.7 F (36.5 C)  Max: 99 F (37.2 C)  Pulse  Min: 87  Max: 109  Resp  Min: 15  Max: 19  BP  Min: 134/80  Max: 175/82  SpO2  Min: 96 %  Max: 100 %    No intake or output data in the 24 hours ending 07/24/22 0540             CARE PLAN       4 eyes in 4 hours pressure injury assessment note:      Completed with:   Unit & Time admitted:              Bony Prominences: Check appropriate box; if wound is present enter wound assessment in LDA     Occiput:                 '[x]'$ WNL  '[]'$  Wound present  Face:                     '[x]'$ WNL  '[]'$  Wound present  Ears:                      '[x]'$ WNL   '[]'$  Wound present  Spine:                    '[x]'$ WNL  '[]'$  Wound present  Shoulders:             [  x]WNL  '[]'$  Wound present  Elbows:                  '[x]'$ WNL  '[]'$  Wound present  LEFT FLANK STAGE 2 PRESSURE INJURY                                                                                     Sacrum/coccyx:     '[]'$ WNL  '[]'$  Wound present  Ischial Tuberosity:  '[]'$ WNL  '[]'$  Wound present  Trochanter/Hip:      '[x]'$ WNL  '[]'$  Wound present  Knees:                   '[x]'$ WNL  '[]'$  Wound present  Ankles:                   '[x]'$ WNL  '[]'$  Wound present  Heels:                    '[x]'$ WNL  '[]'$  Wound present  Other pressure areas:  '[]'$  Wound location       Device related: '[]'$  Device name:         LDA completed if wound present: yes/no  Consult WOCN if necessary    Other skin related issues, ie tears, rash, etc, document in Integumentary flowsheet       Problem: Pain interferes with ability to perform ADL  Goal: Pain at adequate level as identified by patient  Outcome: Progressing  Flowsheets (Taken 07/24/2022 0536)  Pain at adequate level as identified by patient:   Identify patient comfort function goal   Reassess pain within 30-60 minutes of any procedure/intervention, per Pain Assessment, Intervention, Reassessment (AIR) Cycle   Evaluate patient's satisfaction with pain management progress   Assess pain on admission, during daily assessment and/or before any "as needed" intervention(s)   Assess for risk of opioid induced respiratory depression, including snoring/sleep apnea. Alert healthcare team of risk factors identified.   Consult/collaborate with Physical Therapy, Occupational Therapy, and/or Speech Therapy   Include patient/patient care companion in decisions related to pain management as needed     Problem: Side Effects from Pain Analgesia  Goal: Patient will experience minimal side effects of analgesic therapy  Outcome: Progressing  Flowsheets (Taken 07/24/2022 0536)  Patient will experience minimal side effects of analgesic therapy:    Monitor/assess patient's respiratory status (RR depth, effort, breath sounds)   Prevent/manage side effects per LIP orders (i.e. nausea, vomiting, pruritus, constipation, urinary retention, etc.)   Assess for changes in cognitive function   Evaluate for opioid-induced sedation with appropriate assessment tool (i.e. POSS)     Problem: Moderate/High Fall Risk Score >5  Goal: Patient will remain free of falls  Outcome: Progressing  Flowsheets (Taken 07/24/2022 0536)  VH High Risk (Greater than 13):   ALL REQUIRED LOW INTERVENTIONS   ALL REQUIRED MODERATE INTERVENTIONS   RED "HIGH FALL RISK" SIGNAGE   PATIENT IS TO BE SUPERVISED FOR ALL TOILETING ACTIVITIES   BED ALARM WILL BE ACTIVATED WHEN THE PATEINT IS IN BED WITH SIGNAGE "RESET BED ALARM"   A CHAIR PAD ALARM WILL BE USED WHEN PATIENT IS UP SITTING IN A CHAIR  A safety companion may be used when deemed appropriate by the Primary RN and Clinical Administrator   Keep door open for better visibility   Include family/significant other in multidisciplinary discussion regarding plan of care as appropriate   Request PT/OT therapy consult order from physician for patients with gait/mobility impairment   Use assistive devices   Use chair-pad alarm device   Use of floor mat   Use of roll guard     Problem: Compromised Tissue integrity  Goal: Damaged tissue is healing and protected  Outcome: Progressing  Flowsheets (Taken 07/24/2022 0312)  Damaged tissue is healing and protected:   Monitor/assess Braden scale every shift   Reposition patient every 2 hours and as needed unless able to reposition self   Relieve pressure to bony prominences for patients at moderate and high risk   Keep intact skin clean and dry   Monitor external devices/tubes for correct placement to prevent pressure, friction and shearing  Goal: Nutritional status is improving  Outcome: Progressing  Flowsheets (Taken 07/24/2022 0312)  Nutritional status is improving:   Assist patient with eating   Allow  adequate time for meals   Encourage patient to take dietary supplement(s) as ordered   Collaborate with Clinical Nutritionist     Problem: Compromised Tissue integrity  Goal: Nutritional status is improving  Outcome: Progressing  Flowsheets (Taken 07/24/2022 0312)  Nutritional status is improving:   Assist patient with eating   Allow adequate time for meals   Encourage patient to take dietary supplement(s) as ordered   Collaborate with Clinical Nutritionist     Problem: Safety  Goal: Patient will be free from injury during hospitalization  Outcome: Progressing  Flowsheets (Taken 07/24/2022 0536)  Patient will be free from injury during hospitalization:   Assess patient's risk for falls and implement fall prevention plan of care per policy   Ensure appropriate safety devices are available at the bedside   Assess for patients risk for elopement and implement Elopement Risk Plan per policy     Problem: Fluid and Electrolyte Imbalance/ Endocrine  Goal: Fluid and electrolyte balance are achieved/maintained  Outcome: Progressing  Flowsheets (Taken 07/24/2022 0536)  Fluid and electrolyte balance are achieved/maintained:   Monitor/assess lab values and report abnormal values   Assess and reassess fluid and electrolyte status   Observe for cardiac arrhythmias   Monitor for muscle weakness  Goal: Adequate hydration  Outcome: Progressing  Flowsheets (Taken 07/24/2022 0536)  Adequate hydration:   Monitor and assess vital signs and perfusion   Assess mucus membranes, skin color, turgor, perfusion and presence of edema   Assess for peripheral, sacral, periorbital and abdominal edema     Problem: Fluid and Electrolyte Imbalance/ Endocrine  Goal: Adequate hydration  Outcome: Progressing  Flowsheets (Taken 07/24/2022 0536)  Adequate hydration:   Monitor and assess vital signs and perfusion   Assess mucus membranes, skin color, turgor, perfusion and presence of edema   Assess for peripheral, sacral, periorbital and abdominal edema      Problem: Fluid and Electrolyte Imbalance/ Endocrine  Goal: Adequate hydration  Outcome: Progressing  Flowsheets (Taken 07/24/2022 0536)  Adequate hydration:   Monitor and assess vital signs and perfusion   Assess mucus membranes, skin color, turgor, perfusion and presence of edema   Assess for peripheral, sacral, periorbital and abdominal edema

## 2022-07-24 NOTE — UM Notes (Addendum)
Admit to Inpatient (Order [id number])  Admission  Date: 07/24/2022 Department: [facility] Ordering/Authorizing: Eustace Pen, MD PhD        AUTH # [id number]   MEM/INS ID: [id number]    Clinical Impression:   1. NSTEMI (non-ST elevated myocardial infarction)    2. Pressure injury of skin of other site, unspecified injury stage    3. Failure to thrive in adult    4. Urinary incontinence in female           76 y.o. 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  Patient lives at home and her niece/uncle have been taking care of her, however recently unable to do so due to their own health issues.  Patient has a pressure wound on the left lateral lower abdomen/side.  On arrival patient endorses chronic right knee pain since March 2023 (mechanical fall at that time).       Past Medical History:   Diagnosis Date    Diabetes mellitus     History of blood clots     Hypertension              -- -- 104 (Abnormal)   99 % -- 15 170/100 (Abnormal)   -- -- 122 (Abnormal)   -- -- -- CSG   07/23/22 2300 -- -- --                 EKG: Interpreted by me, the Emergency Physician.   Time Interpreted: 2207  Rate: 105  Rhythm: Sinus Tachycardia   Interpretation: QTc 475, no ST elevations or TWIs  Comparison: No prior study is available for comparison.    Lab 07/24/22  0638 07/23/22  1648   WBC 11.91* 11.16*   Hgb 10.2* 12.1   Hematocrit 33.2* 38.5   Platelets 377* 461*     Lab 07/24/22  0638 07/23/22  1823   Sodium 143 143   Potassium 3.4* 3.7     Lab 07/24/22  0638 07/24/22  0026 07/23/22  2218 07/23/22  1648   hs Troponin-I 103.4* 108.5*  --  92.0*         Component  Ref Range & Units 1 d ago   Glucose  70 - 100 mg/dL [address], [place] [id number] ([phone]) (76 y.o. F) PCP: PCP, N  A2502-B  ED Arrival Information    Expected   07/23/2022 15:28    Arrival   07/23/2022 15:25    Acuity   ESI 3          Means of arrival    Ambulance    Escorted by   Self    Service   Medicine    Admission type   Emergency          Arrival complaint   failure to thrive        Chief Complaint    Complaint Comment   Failure To Thrive     ED Diagnoses    ED Diagnosis  Diagnosis Comment Associated Orders   Final diagnoses   NSTEMI (non-ST elevated myocardial infarction) -- --   Pressure injury of skin of other site, unspecified injury stage -- --   Failure to thrive in adult -- --   Urinary incontinence in female -- --      Vitals    Date and Time Restart Vitals Timer Temp Temp src  Pulse SpO2 Heart Rate Source Resp BP BP Location BP Method MAP (mmHg) Patient Position Currently in Pain Pain Score User   07/24/22 1932 -- 97.2 F (36.2 C) Oral 74 98 % -- 15 146/78 Right arm Automatic 101 Lying -- -- AG   07/24/22 1800 -- -- -- -- -- -- -- -- -- -- -- -- -- 0 UC   07/24/22 1719 -- 97.9 F (36.6 C) Oral 73 100 % -- 18 124/77 Left arm -- 93 Lying -- -- JA   07/24/22 1719 -- -- -- -- -- Monitor -- -- -- Automatic -- -- -- -- UC   07/24/22 1203 -- 97.5 F (36.4 C) Oral 70 95 % -- 17 123/73 Left arm -- 90 Lying -- -- JA   07/24/22 1203 -- -- -- -- -- Monitor -- -- -- Automatic -- -- -- -- UC   07/24/22 0914 -- -- -- -- -- -- -- 157/87 -- -- -- -- -- -- UC   07/24/22 0801 -- 97.7 F (36.5 C) Oral 88 97 % -- 17 157/87 Left arm -- 110 Lying -- -- JA   07/24/22 0801 -- -- -- -- -- Monitor -- -- -- Automatic -- -- -- 0 UC   07/24/22 0733 -- -- -- 84 -- -- -- -- -- -- -- -- -- -- MT   07/24/22 0400 -- -- -- 95 -- -- -- -- -- -- -- -- -- -- BGM   07/24/22 0312 -- 97.7 F (36.5 C) Oral 87 96 % Monitor 17 134/80 Right arm Automatic 98 -- -- 0 CN   07/24/22 0202 -- -- -- 90 98 % -- 15 155/73 -- -- 105 -- -- -- CSG   07/24/22 0024 -- -- -- 104 (Abnormal)   99 % -- 15 170/100 (Abnormal)   -- -- 122 (Abnormal)   -- -- -- CSG   07/23/22 2300 -- -- -- 108 (Abnormal)   97 % -- 17 154/82 -- -- 110 -- -- -- KCL   07/23/22 2200 -- -- -- 109 (Abnormal)   98 % -- 19 175/82 -- --  118 (Abnormal)   -- -- -- CSG   07/23/22 1836 -- -- -- 97 97 % Monitor 18 173/77 Right arm -- 111 (Abnormal)   -- -- 5 JC   07/23/22 1550 -- 99 F (37.2 C) Oral 96 100 % Monitor 18 154/69 Right arm Automatic 99 -- -- -- JC   07/23/22 1530 -- -- -- -- -- -- -- -- -- -- -- -- -- 6 JC     ED Notes report    ED Notes     Dictations    None      Contains abnormal data CBC and differential  Order: [id number]  Status: Final result    0 Result Notes      Component  Ref Range & Units 1 d ago   WBC  3.10 - 9.50 x10 3/uL 11.16 High    Hgb  11.4 - 14.8 g/dL 12.1   Hematocrit  34.7 - 43.7 % 38.5   Platelets  142 - 346 x10 3/uL 461 High    RBC  3.90 - 5.10 x10 6/uL 3.90   MCV  78.0 - 96.0 fL 98.7 High    MCH  25.1 - 33.5 pg 31.0   MCHC  31.5 - 35.8 g/dL 31.4 Low    RDW  11 - 15 % 20 High    MPV  8.9 - 12.5  fL 9.4   Instrument Absolute Neutrophil Count  1.10 - 6.33 x10 3/uL 9.26 High    Comment: Comments   Neutrophils  None % 83.0   Lymphocytes Automated  None % 9.6   Monocytes  None % 4.9   Eosinophils Automated  None % 1.7   Basophils Automated  None % 0.3   Immature Granulocytes  None % 0.5   Nucleated RBC  0.0 - 0.0 /100 WBC 0.2 High    Comment: Comments   Neutrophils Absolute  1.10 - 6.33 x10 3/uL 9.26 High    Lymphocytes Absolute Automated  0.42 - 3.22 x10 3/uL 1.07   Monocytes Absolute Automated  0.21 - 0.85 x10 3/uL 0.55   Eosinophils Absolute Automated  0.00 - 0.44 x10 3/uL 0.19   Basophils Absolute Automated  0.00 - 0.08 x10 3/uL 0.03   Immature Granulocytes Absolute  0.00 - 0.07 x10 3/uL 0.06   Absolute NRBC  0.00 - 0.00 x10 3/uL 0.02 High                  Result Care Coordination      Patient Communication     Released  Not seen Back to Top                   Contains abnormal data Urinalysis Reflex to Microscopic Exam- Reflex to Culture  Order: [id number]  Status: Final result    0 Result Notes      Component  Ref Range & Units 19:32   Urine Type Urine, Clean Ca   Color, UA Yellow   Clarity, UA  Clear - Hazy Turbid  Abnormal    Specific Gravity UA  1.001 - 1.035 1.026   Urine pH  5.0 - 8.0 6.0   Leukocyte Esterase, UA  Negative Moderate Abnormal    Nitrite, UA  Negative Negative   Protein, UR  Negative 30= 1+ Abnormal    Glucose, UA  Negative Negative   Ketones UA  Negative Negative   Urobilinogen, UA  0.2 - 2.0 mg/dL Normal   Bilirubin, UA  Negative Negative   Blood, UA  Negative Large Abnormal    RBC, UA  0 - 5 /hpf TNTC Abnormal    WBC, UA  0 - 5 /hpf TNTC Abnormal    Squamous Epithelial Cells, Urine  0 - 25 /hpf 0-5   Urine Mucus  None Present                Contains abnormal data Glucose Whole Blood - POCT  Order: [id number]  Status: Final result      0 Result Notes      Component  Ref Range & Units 1 d ago   Whole Blood Glucose POCT  70 - 100 mg/dL 214 High             Component  Ref Range & Units 1 d ago   Glucose  70 - 100 mg/dL 220 High    Comment: Comments   Protein, Total  6.0 - 8.3 g/dL 4.6 Low    Albumin  3.5 - 5.0 g/dL 1.9 Low           Component  Ref Range & Units 00:26 1 d ago   hs Troponin-I  SEE BELOW ng/L 108.5 Panic  92.0 Panic  CM             Component  Ref Range & Units 06:38 1 d ago   Lactic Acid  0.2 -  2.0 mmol/L 2.6 High              Component  Ref Range & Units 1 d ago   PT  10.1 - 12.9 sec 17.3 High    PT INR  0.9 - 1.1 1.5 High    Comment: Comments   PTT  27 - 39 sec 24 Low           Clarity, UA  Clear - Hazy Turbid Abnormal    Leukocyte Esterase, UA  Negative Moderate Abnormal    Protein, UR  Negative 30= 1+ Abnormal    Blood, UA  Negative Large Abnormal    RBC, UA  0 - 5 /hpf TNTC Abnormal    WBC, UA  0 - 5 /hpf TNTC Abnormal          CT Abd/Pelvis without Contrast (Final result)  Result time 07/24/22 01:43:48  Final result by Donny Pique, MD (07/24/22 01:43:48)                Impression:           1. Nonobstructing left renal stones.    2. Small bladder stones.    3. Additional incidental findings as described above.    Edison Simon, MD  07/24/2022 1:43 AM                  CT Chest without  Contrast (Final result)  Result time 07/24/22 01:40:34  Final result by Timmie Foerster, MD (07/24/22 01:40:34)                Impression:        Small left pleural effusion, with minimal bibasilar areas of atelectasis.    No focal consolidation.    Slightly patulous and fluid-filled esophagus can be seen in the setting of  gastroesophageal reflux.    2.6 x 2.0 x 3.5 cm low-attenuation lesion within the left thyroid lobe.  Correlation with ultrasound is recommended for follow-up.    Enlarged main pulmonary trunk, suggestive of underlying pulmonary arterial  hypertension.    Status post cholecystectomy.    Nonobstructing left renal calculus.    Timmie Foerster MD, MD  07/24/2022 1:40 AM                X-ray chest AP portable (Final result)  Result time 07/23/22 22:42:33  Final result by Denice Paradise, MD (07/23/22 22:42:33)                Impression:       1. Small left pleural effusion. Dense left basilar airspace opacity  suspicious for pneumonia.  2. Air-filled structure projecting over the lower mediastinum suggestive of  hiatal hernia. Air also noted distending the esophagus. If indicated,  further assessment can be made with CT.    Denice Paradise, MD  07/23/2022 10:42 PM                12 lead EKG       Preliminary result                Narrative:    SINUS TACHYCARDIA  RIGHT BUNDLE BRANCH BLOCK  LEFT ANTERIOR FASCICULAR BLOCK  BIFASCICULAR BLOCK   ABNORMAL ECG  NO PREVIOUS ECGS AVAILABLE                ER MEDS     Date/Time Order Dose Route Action Action by Comments    07/23/2022 2300 EST sodium chloride 0.9 %  bolus 1,000 mL 1,000 mL Intravenous [address] [place]e Fee [place], [place] --    07/23/2022 2224 EST piperacillin-tazobactam (ZOSYN) 4.5 g in sterile water (preservative free) 20 mL IV push injection 4.5 g Intravenous Given Heloise Purpura, RN --    07/23/2022 2232 EST vancomycin (VANCOCIN) 2,000 mg in sodium chloride 0.9 % 500 mL IVPB 2,000 mg Intravenous [address] Heloise Purp[place], [place] --    07/23/2022  2342 EST acetaminophen (TYLENOL) tablet 1,000 mg 1,000 mg Oral Given Heloise Purpura, RN --    07/23/2022 2342 EST aspirin chewable tablet 324 mg 324 mg Oral Given Heloise Purpura, RN --    07/24/2022 0132 EST heparin (porcine) injection 4,000 Units 4,000 Units Intravenous Given Heloise Purpura, RN --    07/24/2022 0136 EST heparin 25,000 units in dextrose 5% 500 mL infusion (premix) 9.97 Units/kg/hr Intravenous [address], [place], [place]          H&P    ASSESSMENT & PLAN      Meredith Baker is a 76 y.o. female admitted with NSTEMI (non-ST elevated myocardial infarction).     #Sepsis  -Patient has leukocytosis and tachycardia  -Source of infection not clear  -CT chest without contrast showed small left pleural effusion and evidence of pulm hypertension with no infiltrates  -Pending UA  -Lactic acid normal at 1.4  -IV fluid  -Empiric IV vancomycin/Zosyn  -Follow blood and urine culture and de-escalate antibiotics accordingly     #Elevated troponin   -likely demand ischemia in the setting of sepsis  -Patient free of chest pain or shortness of breath  -Twelve-lead EKG with no acute ischemic changes  -Troponin 92--->108  -Patient given ASA 324 mg p.o. x1 and started on heparin drip per ACS protocol  -Continue with ASA 81 mg daily  -Telemetry monitoring, trend troponin  -Continue with heparin drip per ACS protocol and transition to home dose of Pradaxa in a.m.     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  -Now appears to have significant left lower extremity swelling  -Will obtain venous Doppler of the lower extremity  -Continue anticoagulation with Pradaxa        Other chronic stable conditions  #Hypertension:-Continue amlodipine, metoprolol and losartan     #Type II DM:-SSI, monitor fingersticks closely, hypoglycemia protocol     #Morbid obesity:-Outpatient sleep study     #Bedbound status:-With urinary incontinence and complicated  by left iliac pressure wound  -Ostomy consult in place     -Patient no longer has support at home would like to be placed in a nursing home, case management consult in place       11/26 CARD CONSULT    Assessment:         Mildly elevated troponin I level; patient is chest pain-free.  Abnormal EKG; right bundle branch block and left anterior fascicular block.  Hypertension hypertensive cardiovascular disease.  Arthritis and being bed bound.  Left lower extremities laterally rotated; hip fracture cannot be excluded.     Plan:   Troponin I level to peak.  Lipid panel.  Thyroid function test.  CT chest with contrast rule out PE.  X-ray of the left hip.  Aspirin 81 mg p.o. once a day.  Atorvastatin 40 mg p.o. once a day.  Toprol-XL 50 mg twice a day.  Discontinue amlodipine.  Losartan /chlorothiazide.  Workup for CAD once medically stable.  07/24/22  MEDICINE PROG NOTE      76yo F with history of HTN, HLD, type 2 DM, lower extremity DVT on Pradaxa, bedbound for several months, presenting with failure to thrive and requesting placement. Labs notable for mild leukocytosis, macrocyitc anemia, mild hypokalemia, elevated troponin now downtrending, low serum albumin. She denies chest pain or shortness of breath leading up to her hospitalization.     Plan:   DVT US  Cardiology consult  Stop heparin drip  PT/OT eval  Case management consult  Currently on broad spectrum antibiotics, will de-escalate if cultures negative x48 hours.        BP 146/78   Pulse 74   Temp 97.2 F (36.2 C) (Oral)   Resp 15   Ht 1.575 m ('5\' 2"'$ )   Wt 103.1 kg (227 lb 4.8 oz)   SpO2 98%   BMI 41.57 kg/m       piperacillin-tazobactam (ZOSYN) 4.5 g in sodium chloride 0.9 % 100 mL IVPB mini-bag plus  Dose: 4.5 g  Freq: Every 6 hours Route: IV     Medications '11/17 11/18 11/19 ''11/20 11/21 11/22 ''11/23 11/24 11/25 '$ 11/26   acetaminophen (TYLENOL) tablet 1,000 mg  Dose: 1,000 mg  Freq: Once Route: PO  Start: 07/23/22 2319 End: 07/23/22 2342             23421         amLODIPine (NORVASC) tablet 5 mg  Dose: 5 mg  Freq: Daily Route: PO  Start: 07/24/22 0900 End: 07/24/22 2127             [id number]     2127-D/C'd      aspirin chewable tablet 324 mg  Dose: 324 mg  Freq: Once Route: PO  Start: 07/23/22 2319 End: 07/23/22 2342            [id number]         aspirin chewable tablet 81 mg  Dose: 81 mg  Freq: Daily Route: PO  Start: 07/24/22 0900             09144        dabigatran (PRADAXA) capsule 150 mg  Dose: 150 mg  Freq: Every 12 hours scheduled Route: PO  Start: 07/24/22 0900             1114 [C]5     22006        dabigatran (PRADAXA) capsule 150 mg  Dose: 150 mg  Freq: Every 12 hours scheduled Route: PO  Start: 07/24/22 0900 End: 07/24/22 0334             0334-D/C'd      heparin (porcine) injection 4,000 Units  Dose: 4,000 Units  Freq: Once Route: IV  Start: 07/24/22 0100 End: 07/24/22 0132             01327        losartan (COZAAR) 100 mg, hydroCHLOROthiazide (HYDRODIURIL) 25 mg for HYZAAR  Freq: Daily Route: PO  Start: 07/24/22 0900             09148        metoprolol succinate XL (TOPROL-XL) 24 hr tablet 200 mg  Dose: 200 mg  Freq: Daily Route: PO  Start: 07/24/22 0900             09149        piperacillin-tazobactam (ZOSYN) 4.5 g in sodium chloride 0.9 % 100 mL IVPB mini-bag plus  Dose: 4.5 g  Freq: Every 6 hours Route:  IV  Last Dose: 4.5 g (07/24/22 2201)  Start: 07/24/22 0415   Order specific questions:                [id number]     [id number]     [id number] [C]14     [id number]     [id number]        piperacillin-tazobactam (ZOSYN) 4.5 g in sodium chloride 0.9 % 100 mL IVPB mini-bag plus  Dose: 4.5 g  Freq: Every 8 hours Route: IV  Start: 07/24/22 0400 End: 07/24/22 0336   Order specific questions:                0336-D/C'd      piperacillin-tazobactam (ZOSYN) 4.5 g in sterile water (preservative free) 20 mL IV push injection  Dose: 4.5 g  Freq: Once Route: IV  Start: 07/23/22 2137 End: 07/23/22 2227   Order specific questions:               [id number]          sodium chloride 0.9 % bolus 1,000 mL  Dose: 1,000 mL  Freq: Once Route: IV  Last Dose: 1,000 mL (07/23/22 2300)  Start: 07/23/22 2135 End: 07/24/22 0000            [id number]      [id number]        vancomycin (VANCOCIN) 1500 mg in sodium chloride 0.9% 500 mL (premix)  Dose: 1,500 mg  Freq: Once Route: IV  Start: 07/23/22 2137 End: 07/23/22 2139   Order specific questions:               21     2139-D/C'd       vancomycin (VANCOCIN) 2,000 mg in sodium chloride 0.9 % 500 mL IVPB  Dose: 2,000 mg  Freq: Once Route: IV  Last Dose: Stopped (07/24/22 0135)  Start: 07/23/22 2141 End: 07/24/22 0135   Order specific questions:               [id number]        vancomycin (VANCOCIN) 750 mg in sodium chloride 0.9% 250 mL (premix)  Dose: 750 mg  Freq: Every 12 hours Route: IV                heparin 25,000 units in dextrose 5% 500 mL infusion (premix)  Rate: 20 mL/hr Dose: 9.97 Units/kg/hr  Weight Dosing Info: 100.3 kg  Freq: Continuous Route: IV  Last Dose: Stopped (07/24/22 0745)  Start: 07/24/22 0100 End: 07/24/22 0747     0.9% NaCl infusion  Rate: 100 mL/hr Freq: Continuous Route: IV  Start: 07/24/22 0400     Marciano Sequin RN, BSN, ACM-RN, CCM  Utilization Review RN Case Manager [facility]  Utilization Review Department  [address], [place]  [place], [place] [address]  T [phone] (Confidential voice mail) C [phone] (Confidential voice mail)   Jerilynn Mages [phone] F [phone]  [email or web address]

## 2022-07-24 NOTE — Progress Notes (Signed)
Anti-xa this morning is 0.82. Per protocol hold for 60 min and decrease by 3 units/kg/hr.

## 2022-07-24 NOTE — ED to IP RN Note (Signed)
[facility]  ED NURSING NOTE FOR THE RECEIVING INPATIENT NURSE   ED NURSE Cathrine Muster [phone]   ED CHARGE RN [phone]   ADMISSION INFORMATION   Lanie Vestal is a 76 y.o. female admitted with an ED diagnosis of:    1. NSTEMI (non-ST elevated myocardial infarction)    2. Pressure injury of skin of other site, unspecified injury stage    3. Failure to thrive in adult    4. Urinary incontinence in female         Isolation: None   Allergies: Patient has no allergy information on record.   Holding Orders confirmed? Yes   Belongings Documented? Yes   Home medications sent to pharmacy confirmed? N/A   NURSING CARE   Patient Comes From:   Mental Status: Other Was living with boyfriend. Niece said she will live with her after discharge.   alert, oriented, and confused   ADL: Needs assistance with ADLs   Ambulation: 2 person assist   Pertinent Information  and Safety Concerns:     Broset Violence Risk Level: Moderate Heparin currently infusing. PT and PT INR elevated. 2nd trop 108.5.      CT / NIH   CT Head ordered on this patient?  No   NIH/Dysphagia assessment done prior to admission? No   VITAL SIGNS (at the time of this note)      Vitals:    07/24/22 0024   BP: (!) 170/100   Pulse: (!) 104   Resp: 15   Temp:    SpO2: 99%

## 2022-07-24 NOTE — Progress Notes (Signed)
Unable to collect the Lab, Patient hard stick and dehydrated, Dr aware.

## 2022-07-24 NOTE — Plan of Care (Signed)
NURSING SHIFT NOTE     Patient: Meredith Baker  Day: 0      SHIFT EVENTS     Shift Narrative/Significant Events (PRN med administration, fall, RRT, etc.):   Patient is alert and oriented, generalized weakness, denied pain, turning and repositioning done. Antibiotics and iv fluid continue, no acute distress noted, continue to monitor the Patient.     Safety and fall precautions remain in place. Purposeful rounding completed.          ASSESSMENT     Changes in assessment from patient's baseline this shift:    Neuro: No  CV: No  Pulm: No  Peripheral Vascular: No  HEENT: No  GI: No  BM during shift: No, Last BM: Last BM Date: 07/23/22  GU: No   Integ: No  MS: No    Pain: None  Pain Interventions: Rest  Medications Utilized:     Mobility: PMP Activity: Step 3 - Bed Mobility of Distance Walked (ft) (Step 6,7): 0 Feet (bedrest)           Lines     Patient Lines/Drains/Airways Status       Active Lines, Drains and Airways       Name Placement date Placement time Site Days    Peripheral IV 07/23/22 22 G Standard Right Antecubital 07/23/22  1646  Antecubital  less than 1    Peripheral IV 07/24/22 20 G Anterior;Left Forearm 07/24/22  0030  Forearm  less than 1    External Urinary Catheter 07/23/22  1602  --  less than 1                         VITAL SIGNS     Vitals:    07/24/22 0914   BP: 157/87   Pulse:    Resp:    Temp:    SpO2:        Temp  Min: 97.7 F (36.5 C)  Max: 99 F (37.2 C)  Pulse  Min: 84  Max: 109  Resp  Min: 15  Max: 19  BP  Min: 134/80  Max: 175/82  SpO2  Min: 96 %  Max: 100 %      Intake/Output Summary (Last 24 hours) at 07/24/2022 1143  Last data filed at 07/24/2022 1000  Gross per 24 hour   Intake 350 ml   Output --   Net 350 ml            CARE PLAN       Problem: Everyday - Pneumonia  Goal: Stable Vital Signs and Fluid Balance  Outcome: Progressing  Flowsheets (Taken 07/24/2022 1000)  Stable Vital Signs and Fluid Balance:   Monitor/ assess vital signs and telemetry per policy   Monitor labs and report  abnormalities to physicians   Monitor Intake/Output   Oxygen as needed- wean per guideline([facility] only)     Problem: Safety  Goal: Patient will be free from injury during hospitalization  Outcome: Progressing  Flowsheets (Taken 07/24/2022 1000)  Patient will be free from injury during hospitalization:   Hourly rounding   Include patient/ family/ care giver in decisions related to safety   Ensure appropriate safety devices are available at the bedside   Use appropriate transfer methods   Provide and maintain safe environment   Assess patient's risk for falls and implement fall prevention plan of care per policy     Problem: Fluid and Electrolyte Imbalance/ Endocrine  Goal: Fluid and electrolyte  balance are achieved/maintained  Outcome: Progressing  Flowsheets (Taken 07/24/2022 1000)  Fluid and electrolyte balance are achieved/maintained:   Assess and reassess fluid and electrolyte status   Monitor/assess lab values and report abnormal values   Observe for cardiac arrhythmias   Monitor for muscle weakness

## 2022-07-24 NOTE — Progress Notes (Signed)
Initial Pharmacy Vancomycin Dosing Consult:  Day 1  Vancomycin Indication: Sepsis  Vancomycin Monitoring Goal: AUC/MIC 400- 600 mg*hr/L       Serum creatinine: 0.6 mg/dL 07/23/22 1823  Estimated creatinine clearance: 92.5 mL/min    Baseline SCr: 0.6-0.7    Nephrotoxic drugs administered in the past 48 hours: ACE/ARB, Advanced Age (>65Y), Piperacillin/tazobactam    Assessment   Patient is a 76yo female admitted to [facility] on 11-25 for treatment of sepsis / NSTEMI. She was started on pip/tazo and vancomycin.    Loading dose: '2000mg'$   Regimen: 750 mg IV every 12 hours.  Exposure target: AUC24 (range)400-600 mg/L.hr   AUC24,ss: 429 mg/L.hr  Probability of AUC24 > 400: 55 %  Ctrough,ss: 13 mg/L  Probability of Ctrough,ss > 20: 27 %  Probability of nephrotoxicity (Lodise CID 2009): 8 %       Recommendations:   Initial loading dose ('19mg'$ /kg) = '2000mg'$    Maintenance regimen (7.'5mg'$ /kg) = '750mg'$  Q12hr  Vancomycin level: single, random, 11-27 w/ AM labs    Thank you for this consult. Pharmacy will continue to follow this patient's progress with you until consult and/or vancomycin is discontinued.  Denton Lank, [facility]  Phone: [phone]

## 2022-07-25 ENCOUNTER — Inpatient Hospital Stay: Payer: Medicare HMO

## 2022-07-25 DIAGNOSIS — F39 Unspecified mood [affective] disorder: Secondary | ICD-10-CM

## 2022-07-25 LAB — VANCOMYCIN, RANDOM: Vancomycin Random: 14.2 ug/mL

## 2022-07-25 LAB — GLUCOSE WHOLE BLOOD - POCT
Whole Blood Glucose POCT: 142 mg/dL — ABNORMAL HIGH (ref 70–100)
Whole Blood Glucose POCT: 148 mg/dL — ABNORMAL HIGH (ref 70–100)
Whole Blood Glucose POCT: 161 mg/dL — ABNORMAL HIGH (ref 70–100)
Whole Blood Glucose POCT: 163 mg/dL — ABNORMAL HIGH (ref 70–100)

## 2022-07-25 LAB — ECG 12-LEAD
Atrial Rate: 105 {beats}/min
P Axis: 76 degrees
P-R Interval: 194 ms
Q-T Interval: 360 ms
QRS Duration: 128 ms
QTC Calculation (Bezet): 475 ms
R Axis: -57 degrees
T Axis: 107 degrees
Ventricular Rate: 105 {beats}/min

## 2022-07-25 LAB — T4, FREE: T4 Free: 1.19 ng/dL (ref 0.69–1.48)

## 2022-07-25 LAB — TSH: TSH: 0.33 u[IU]/mL — ABNORMAL LOW (ref 0.35–4.94)

## 2022-07-25 LAB — ANTI-XA,UFH: Anti-Xa, UFH: <0.04 IU/mL

## 2022-07-25 MED ORDER — SENNOSIDES-DOCUSATE SODIUM 8.6-50 MG PO TABS
1.0000 | ORAL_TABLET | Freq: Every evening | ORAL | Status: DC
Start: 2022-07-25 — End: 2022-08-15
  Administered 2022-07-25 – 2022-08-11 (×8): 1 via ORAL
  Filled 2022-07-25 (×12): qty 1

## 2022-07-25 MED ORDER — CARVEDILOL 12.5 MG PO TABS
12.5000 mg | ORAL_TABLET | Freq: Two times a day (BID) | ORAL | Status: DC
Start: 2022-07-25 — End: 2022-08-15
  Administered 2022-07-25 – 2022-08-14 (×35): 12.5 mg via ORAL
  Filled 2022-07-25 (×42): qty 1

## 2022-07-25 MED ORDER — INSULIN LISPRO 100 UNIT/ML SOLN (WRAP)
1.0000 [IU] | Freq: Every evening | Status: DC
Start: 2022-07-25 — End: 2022-07-28
  Administered 2022-07-26 – 2022-07-27 (×2): 1 [IU] via SUBCUTANEOUS
  Filled 2022-07-25 (×2): qty 3
  Filled 2022-07-25: qty 12

## 2022-07-25 MED ORDER — GLUCOSE 40 % PO GEL (WRAP)
15.0000 g | ORAL | Status: DC | PRN
Start: 2022-07-25 — End: 2022-08-15

## 2022-07-25 MED ORDER — DEXTROSE 10 % IV BOLUS
12.5000 g | INTRAVENOUS | Status: DC | PRN
Start: 2022-07-25 — End: 2022-08-15

## 2022-07-25 MED ORDER — MICONAZOLE NITRATE 2 % EX CREAM WITH ZINC OXIDE
TOPICAL_CREAM | Freq: Two times a day (BID) | CUTANEOUS | Status: DC
Start: 2022-07-25 — End: 2022-08-15
  Filled 2022-07-25 (×3): qty 142

## 2022-07-25 MED ORDER — INSULIN LISPRO 100 UNIT/ML SOLN (WRAP)
1.0000 [IU] | Freq: Three times a day (TID) | Status: DC
Start: 2022-07-25 — End: 2022-07-28
  Administered 2022-07-27 (×3): 2 [IU] via SUBCUTANEOUS
  Administered 2022-07-28 (×2): 3 [IU] via SUBCUTANEOUS
  Administered 2022-07-28: 1 [IU] via SUBCUTANEOUS
  Filled 2022-07-25 (×2): qty 6
  Filled 2022-07-25: qty 3
  Filled 2022-07-25: qty 6
  Filled 2022-07-25: qty 3
  Filled 2022-07-25: qty 6

## 2022-07-25 MED ORDER — ATORVASTATIN CALCIUM 40 MG PO TABS
40.0000 mg | ORAL_TABLET | Freq: Every evening | ORAL | Status: DC
Start: 2022-07-25 — End: 2022-08-15
  Administered 2022-07-25 – 2022-08-14 (×21): 40 mg via ORAL
  Filled 2022-07-25 (×21): qty 1

## 2022-07-25 MED ORDER — VENELEX EX OINT
TOPICAL_OINTMENT | Freq: Two times a day (BID) | CUTANEOUS | Status: DC
Start: 2022-07-26 — End: 2022-08-15
  Administered 2022-07-25 – 2022-08-15 (×34): 60 g via TOPICAL
  Filled 2022-07-25 (×3): qty 56.7

## 2022-07-25 MED ORDER — DEXTROSE 50 % IV SOLN
12.5000 g | INTRAVENOUS | Status: DC | PRN
Start: 2022-07-25 — End: 2022-08-15

## 2022-07-25 MED ORDER — GLUCAGON 1 MG IJ SOLR (WRAP)
1.0000 mg | INTRAMUSCULAR | Status: DC | PRN
Start: 2022-07-25 — End: 2022-08-15

## 2022-07-25 MED ORDER — GABAPENTIN 100 MG PO CAPS
200.0000 mg | ORAL_CAPSULE | Freq: Three times a day (TID) | ORAL | Status: DC
Start: 2022-07-25 — End: 2022-08-15
  Administered 2022-07-25 – 2022-08-14 (×58): 200 mg via ORAL
  Filled 2022-07-25 (×63): qty 2

## 2022-07-25 NOTE — Plan of Care (Addendum)
NURSING SHIFT NOTE     Patient: Meredith Baker  Day: 1      SHIFT EVENTS     Shift Narrative/Significant Events (PRN med administration, fall, RRT, etc.):   Patient is alert and oriented, generalized weakness, denied pain, turning and repositioning done. Antibiotics and iv fluid continue, no acute distress noted, continue to monitor the Patient.          Safety and fall precautions remain in place. Purposeful rounding completed.          ASSESSMENT     Changes in assessment from patient's baseline this shift:    Neuro: NO  CV:NO  Pulm: NO  Peripheral Vascular: NO  HEENT: NO  GI: NO  BM during shift: NO, Last BM: Last BM Date: 07/23/22  GU: NO   Integ: NO  MS: NO    Pain: NO  Pain Interventions: NO  Medications Utilized: NO    Mobility: PMP Activity: Step 3 - Bed Mobility of Distance Walked (ft) (Step 6,7): 0 Feet (bedrest)           Lines     Patient Lines/Drains/Airways Status       Active Lines, Drains and Airways       Name Placement date Placement time Site Days    Peripheral IV 07/23/22 22 G Standard Right Antecubital 07/23/22  1646  Antecubital  1    Peripheral IV 07/24/22 20 G Anterior;Left Forearm 07/24/22  0030  Forearm  1    External Urinary Catheter 07/23/22  1602  --  1                         VITAL SIGNS     Vitals:    07/24/22 2256   BP: 141/74   Pulse: 78   Resp: 16   Temp: 97.7 F (36.5 C)   SpO2: 98%       Temp  Min: 97.2 F (36.2 C)  Max: 97.9 F (36.6 C)  Pulse  Min: 70  Max: 95  Resp  Min: 15  Max: 18  BP  Min: 123/73  Max: 157/87  SpO2  Min: 95 %  Max: 100 %      Intake/Output Summary (Last 24 hours) at 07/25/2022 0148  Last data filed at 07/24/2022 1934  Gross per 24 hour   Intake 950 ml   Output 500 ml   Net 450 ml                CARE PLAN       4 eyes in 4 hours pressure injury assessment note:      Completed with: [facility]  Unit & Time admitted:  25 @ 2110            Bony Prominences: Check appropriate box; if wound is present enter wound assessment in LDA     Occiput:                  '[x]'$ WNL  '[]'$  Wound present  Face:                     '[x]'$ WNL  '[]'$  Wound present  Ears:                      '[x]'$ WNL  '[]'$  Wound present  Spine:                    '[x]'$ WNL  '[]'$   Wound present  Shoulders:             '[x]'$ WNL  '[]'$  Wound present  LEFT FLANK PRESSURE INJURY AND BILATERAL LOW EXTREMITIES EDEMA +2  Elbows:                  '[x]'$ WNL  '[]'$  Wound present  Sacrum/coccyx:     '[]'$ WNL  '[]'$  Wound present  Ischial Tuberosity:  '[x]'$ WNL  '[]'$  Wound present  Trochanter/Hip:      '[x]'$ WNL  '[]'$  Wound present  Knees:                   '[x]'$ WNL  '[]'$  Wound present  Ankles:                   '[]'$ WNL  '[]'$  Wound present  Heels:                    '[]'$ WNL  '[]'$  Wound present  Other pressure areas:  '[]'$  Wound location       Device related: '[]'$  Device name:         LDA completed if wound present: yes/no  Consult WOCN if necessary    Other skin related issues, ie tears, rash, etc, document in Integumentary flowsheet       Problem: Pain interferes with ability to perform ADL  Goal: Pain at adequate level as identified by patient  Outcome: Progressing  Flowsheets (Taken 07/24/2022 0536)  Pain at adequate level as identified by patient:   Identify patient comfort function goal   Reassess pain within 30-60 minutes of any procedure/intervention, per Pain Assessment, Intervention, Reassessment (AIR) Cycle   Evaluate patient's satisfaction with pain management progress   Assess pain on admission, during daily assessment and/or before any "as needed" intervention(s)   Assess for risk of opioid induced respiratory depression, including snoring/sleep apnea. Alert healthcare team of risk factors identified.   Consult/collaborate with Physical Therapy, Occupational Therapy, and/or Speech Therapy   Include patient/patient care companion in decisions related to pain management as needed     Problem: Side Effects from Pain Analgesia  Goal: Patient will experience minimal side effects of analgesic therapy  Outcome: Progressing  Flowsheets (Taken 07/24/2022 0536)  Patient will  experience minimal side effects of analgesic therapy:   Monitor/assess patient's respiratory status (RR depth, effort, breath sounds)   Prevent/manage side effects per LIP orders (i.e. nausea, vomiting, pruritus, constipation, urinary retention, etc.)   Assess for changes in cognitive function   Evaluate for opioid-induced sedation with appropriate assessment tool (i.e. POSS)     Problem: Moderate/High Fall Risk Score >5  Goal: Patient will remain free of falls  Outcome: Progressing  Flowsheets (Taken 07/24/2022 0536)  VH High Risk (Greater than 13):   ALL REQUIRED LOW INTERVENTIONS   ALL REQUIRED MODERATE INTERVENTIONS   RED "HIGH FALL RISK" SIGNAGE   PATIENT IS TO BE SUPERVISED FOR ALL TOILETING ACTIVITIES   BED ALARM WILL BE ACTIVATED WHEN THE PATEINT IS IN BED WITH SIGNAGE "RESET BED ALARM"   A CHAIR PAD ALARM WILL BE USED WHEN PATIENT IS UP SITTING IN A CHAIR   A safety companion may be used when deemed appropriate by the Primary RN and Clinical Administrator   Keep door open for better visibility   Include family/significant other in multidisciplinary discussion regarding plan of care as appropriate   Request PT/OT therapy consult order from physician for patients with gait/mobility impairment   Use assistive devices   Use chair-pad alarm  device   Use of floor mat   Use of roll guard     Problem: Compromised Tissue integrity  Goal: Damaged tissue is healing and protected  Outcome: Progressing  Flowsheets (Taken 07/25/2022 0141)  Damaged tissue is healing and protected:   Monitor/assess Braden scale every shift   Relieve pressure to bony prominences for patients at moderate and high risk   Increase activity as tolerated/progressive mobility   Provide wound care per wound care algorithm   Keep intact skin clean and dry   Use incontinence wipes for cleaning urine, stool and caustic drainage. Foley care as needed   Monitor external devices/tubes for correct placement to prevent pressure, friction and shearing    Consult/collaborate with wound care nurse  Goal: Nutritional status is improving  Outcome: Progressing  Flowsheets (Taken 07/24/2022 2000)  Nutritional status is improving:   Assist patient with eating   Allow adequate time for meals   Encourage patient to take dietary supplement(s) as ordered   Collaborate with Clinical Nutritionist   Include patient/patient care companion in decisions related to nutrition     Problem: Safety  Goal: Patient will be free from injury during hospitalization  Outcome: Progressing  Flowsheets (Taken 07/25/2022 0141)  Patient will be free from injury during hospitalization:   Ensure appropriate safety devices are available at the bedside   Assess for patients risk for elopement and implement [place] per policy   Include patient/ family/ care giver in decisions related to safety   Provide and maintain safe environment   Provide alternative method of communication if needed (communication boards, writing)   Hourly rounding   Use appropriate transfer methods  Goal: Patient will be free from infection during hospitalization  Outcome: Progressing  Flowsheets (Taken 07/25/2022 0141)  Free from Infection during hospitalization:   Assess and monitor for signs and symptoms of infection   Encourage patient and family to use good hand hygiene technique   Monitor lab/diagnostic results   Monitor all insertion sites (i.e. indwelling lines, tubes, urinary catheters, and drains)     Problem: Safety  Goal: Patient will be free from infection during hospitalization  Outcome: Progressing  Flowsheets (Taken 07/25/2022 0141)  Free from Infection during hospitalization:   Assess and monitor for signs and symptoms of infection   Encourage patient and family to use good hand hygiene technique   Monitor lab/diagnostic results   Monitor all insertion sites (i.e. indwelling lines, tubes, urinary catheters, and drains)     Problem: Fluid and Electrolyte Imbalance/ Endocrine  Goal: Fluid and electrolyte  balance are achieved/maintained  Outcome: Progressing  Flowsheets (Taken 07/25/2022 0141)  Fluid and electrolyte balance are achieved/maintained:   Monitor/assess lab values and report abnormal values   Assess and reassess fluid and electrolyte status   Observe for cardiac arrhythmias   Monitor for muscle weakness  Goal: Adequate hydration  Outcome: Progressing  Flowsheets (Taken 07/25/2022 0141)  Adequate hydration:   Assess mucus membranes, skin color, turgor, perfusion and presence of edema   Assess for peripheral, sacral, periorbital and abdominal edema   Monitor and assess vital signs and perfusion     Problem: Fluid and Electrolyte Imbalance/ Endocrine  Goal: Adequate hydration  Outcome: Progressing  Flowsheets (Taken 07/25/2022 0141)  Adequate hydration:   Assess mucus membranes, skin color, turgor, perfusion and presence of edema   Assess for peripheral, sacral, periorbital and abdominal edema   Monitor and assess vital signs and perfusion     Problem: Day of Admission -  Pneumonia  Goal: Pneumonia Admission  Outcome: Progressing  Flowsheets (Taken 07/25/2022 0141)  Pneumonia Admission:   Standing Weight on admission. If unable to stand-zero the bed and use the bed scale   Assess and document vaccination status   Educate patient and caregiver on  PNA, incentive spirometer, oral care, mobility and review 90 day commitment care([facility] only)   Dysphagia screening if ordered by provider   Vital signs and telemetry per policy     Problem: Everyday - Pneumonia  Goal: Stable Vital Signs and Fluid Balance  Outcome: Progressing  Flowsheets (Taken 07/25/2022 0141)  Stable Vital Signs and Fluid Balance:   Monitor/ assess vital signs and telemetry per policy   Monitor Intake/Output   Oxygen as needed- wean per guideline([facility] only)  Goal: Mobility/Activity is Maintained at Optimal Level for Patient  Outcome: Progressing  Flowsheets (Taken 07/25/2022 0141)  Mobility/Activity is Maintained at Optimal Level for Patient:    Increase mobility as tolerated using Dangle/Stand/Walk and progressive mobility   Reposition patient every 2 hours and as needed unless able to reposition self   Perform active/passive ROM     Problem: Everyday - Pneumonia  Goal: Mobility/Activity is Maintained at Optimal Level for Patient  Outcome: Progressing  Flowsheets (Taken 07/25/2022 0141)  Mobility/Activity is Maintained at Optimal Level for Patient:   Increase mobility as tolerated using Dangle/Stand/Walk and progressive mobility   Reposition patient every 2 hours and as needed unless able to reposition self   Perform active/passive ROM

## 2022-07-25 NOTE — Consults (Signed)
Wound Ostomy Continence Consultation / Progress Note    Date Time: 07/25/22 12:40 PM  Patient Name: Meredith Baker, Meredith Baker  Consulting Service: [facility] Day: 3     Reason for Consult / Follow Up   Buttock wounds    Assessment & Plan   Assessment:   Pt assessed in her room resting in bed. She is awake and alert, able to move her extremities, but she mentions that the blankets on her feet can be too heavy to move.    She has several abrasiosn/wounds on her left hip and flank at various stages of healing (stage 2 pressure injury). Pink wound beds with dark edges. One of the wounds has a scant amount of sanguinous drainage, some moist, some dry. All appear to be partial thickness skin loss. Pt states that when she's at home she tends to sleep on her left side.    She is obese with some intertrigo and moisture associated skin damage (MASD) in the perineal areas, buttock, and the gluteal cleft. Moist pink-red discoloration with some areas of tissue erosion. There is scant serosanguinous drainage. She is currently not continent at home and unable to ambulate or position herself in bed.    Wound Photography:       Plan/Follow-Up:   Wound care to skin perineal, buttocks, gluteal cleft, perianal, groin:  1. Cleanse with Normal saline/ wound cleanser/peri-wipes and pat it dry.  2. Apply layer of Baza (miconazole) cream or Venelex every 6 hours and as needed (each scheduled q12).  3. Apply a layer of Triad (thickness of a dime) directly on open (bleeding/draining) wounds If Triad not sticking to wound bed, pat to dry and reapply.  4. Keep open to air.    Triad Hydrophilic Wound Dressing is a Zinc-oxide based hydrophilic paste for light-to- moderate levels of wound exudates. Helps maintain an optimal wound healing environment to facilitate natural autolytic debridement. Ideal alternative for difficult-to-dress areas and varying wound etiologies.    Baza Antifungal (miconazole nitrate 2%) is a moisture barrier cream that  inhibits fungal growth and treats candidiasis, jock itch, ringworm, and athlete's foot - also provides a moisture barrier against urine and feces. It has skin conditioners and is CHG compatible.    Venelex (balsam of [place]) is used to stimulate effective capillary bed action and increase circulation to wound and provide a moist environment.    Initiate/ continue pressure prevention bundle:  Head of the bed 30 degrees or less  Positioning device to the bedside  Eliminate/minimize pressure from the area  Float heels with boots or pillows  Turn patient  Pressure redistribution cushion to the chair  Use lift sheets/low friction surface sheets for positioning  Pad bony prominences  Nutrition consult/Optimize nutrition  Initiate bed algorithm/Specialty bed  Moisture/Incontinence management - Cleanse with incontinence cleansing wipe/water to manage incontinence and to protect skin from exposure to urine and stool. Apply skin barrier protection cream. Apply [place]/external or female external urine management system to prevent urinary contamination of a wound. Apply rectal pouch/fecal management system per unit policy, to prevent fecal contamination of a wound or to contain diarrhea.    Patient Education:  Discussed with the patient and all questioned fully answered.     Specialty Bed: Centrella Mattress (Med-Surg) - Innovative support surfaces help manage pressure, shear and moisture to deliver optimal wound prevention and healing.     History of Present Illness   This is a 76 y.o. female  has a past medical history of Diabetes  mellitus, History of blood clots, and Hypertension..  Admitted with NSTEMI (non-ST elevated myocardial infarction).      From Nursing/Other Documentation:   Braden Scale Score: 12 (07/25/22 0805)  Braden Subscales:  Sensory Perceptions: Slightly limited (07/25/22 0805)  Moisture: Occasionally moist (07/25/22 0805)  Activity: Bedfast (07/25/22 0805)  Mobility: Very limited (07/25/22 0805)  Nutrition:  Probably inadequate (07/25/22 0805)  Friction and Shear: Problem (07/25/22 0805)    Last BM Date: 07/25/22 (07/25/22 0805)  Urinary Incontinence: No (07/25/22 0400)    Ht Readings from Last 1 Encounters:   07/24/22 1.575 m ('5\' 2"'$ )     Wt Readings from Last 3 Encounters:   07/24/22 103.1 kg (227 lb 4.8 oz)     Body mass index is 41.57 kg/m.    Current Diet:   Diet consistent carbohydrate  Supervise For Meals Frequency: All meals     Lab Results   Component Value Date    GLU 175 (H) 07/24/2022    CREAT 0.7 07/24/2022     Recent Labs   Lab 07/25/22  1203 07/25/22  1007 07/23/22  1555   Whole Blood Glucose POCT 148* 142* 214*     Recent Labs     07/23/22  2218   Creatine Kinase (CK) 22     Hanley Hays, BSN, RN, [employer], [employer]  Inpatient Wound, Ostomy, and Continence Nurse Coordinator  [facility]  [phone]

## 2022-07-25 NOTE — Consults (Signed)
PSYCHIATRIC INITIAL CONSULTATION NOTE    Patient name:  Meredith Baker, Meredith Baker  Date of birth:  11-Feb-1946  Age:  76 y.o.  MRN:  [id number]  CSN:  [id number]  Date of admission:  07/23/2022  Date of consultation:  07/25/2022  Attending/Referring Physician:  Jerelyn Scott, MD  Consulting Attending Physician:  Dr. Raenette Rover   Consulting Psychiatric Nurse Practitioner: Darlina Guys  ==================================================    Reason for admission:  Failure to thrive in adult [R62.7]  NSTEMI (non-ST elevated myocardial infarction) [I21.4]  Urinary incontinence in female [R32]  Pressure injury of skin of other site, unspecified injury stage [L89.899]     Reason for Consultation:  Capacity to make decision regarding discharge placement of home vs SNF       Background:   Briefely, this is a 46.y.o african [place] female with a medical history of HTN, type 2 DM, Hyperlipidemia, osteoarthritis, BL DVT's, bed bounded patient who presents to [facility] on 11/26 for generalized weakness, shortly found to be septic subsequently causing  NSTEMI likely from demand ischemia. She was admitted for further management and treated with antibiotics. She lives with her niece and 34 y.o uncle who has been taking care of all of her needs for 4-5 years now but lately has become increasingly weak unable to take care of her. Psychiatry consulted for capacity to make decision on SNF vs home.     Exam:  Patient presents as a 76 y.o african [place] female laying in bed with hospital gown on. She is thin brittle boy cur hair, freckles through out face. She is calm, cooperative and engaged. Affect is euthymic. Thought process is organized, linear, and logical. Thought content includes inquiring about SNF placement. Association is goal-directed. No perceptual disturbances noted. She is alert and oriented to all spheres.     Patient reports desire to go to SNF. She reports that her uncle [place] getting old, while he has been taking care of her ADL's,  cooking, cleaning ect, she acknowledges that he is getting old and unable to take care of her. She reports that she needs round the clock nursing care, someone to feed her, change her and attend to her "I can get proper care at a SNF. I am not getting proper care now, I know I need help with thinks like baking, cooking, cleaning". She understands the risks of going back home to her living conditions, reporting that her uncle is not able to attend to her like he use to. She reports that she needs to ensure she is taking her medications, keeping up with her care. She recognizes that she can worsen in medical conditions or die if she is not properly taken care of reporting that she can no longer get optimal care at home. She is able to clearly report choice of going to SNF at time of discharge.     Patient denies a psychiatric history. She denies associated symptoms of depression, mania, psychosis, PTSD. She endorses good sleep and appetite. Denies suicide history or attempt. Denies psychiatric hospitalization. Denies SI / HI/ AVH. Denies access to weapons.     Therapy: Interpersonal struggles within relationship were explored with guidance, Motivational Enhancement therapy was implemented, Relaxation and stress management training was imparted to patient, or Solution-focused and supportive therapy strategies were implemented    Psychiatric Review of Systems:   Safety:  Denies aggression, self-injurious behaviors, suicidal thoughts/plans/intent, homicidal thoughts/plans/intent.  Depression:  Denies depressed mood, anhedonia, sleep issues, changes to appetite/weight, irritability.  Mania:  Denies elevated  mood, intrusiveness/poor social boundaries, hypersexuality, impulsivity.  Anxiety: Denies anxiety, though proportional to situation. Denies panic attacks, phobias, obscessions and compulsions, rituals.  Psychosis:  Denies hallucinations, delusions, paranoia, ideas of reference.  Trauma:  Denies recent trauma,  hypervigalence, nightmares, flashbacks, dissociations.    Mental Status Examination:  General Appearance:  presents as a 77 y.o african [place] female laying in bed with hospital gown on. She is thin brittle boy cur hair, freckles through out face.      Behavior:  She is calm, cooperative and engaged.    Speech:  Normal rhythm and rate    Mood:  "  good      "   Affect:  euthymic     Thought Process:  organized, linear, and logical.      Thought content:  includes inquiring about SNF placement.   Association: Goal-directed    Perceptual Disturbances: Not noted      Cognition  Orientation: A&Otimes 4   Concentration: Appears Intact not formally tested   Memory:Appears intact, not formally tested   Abstract reasoning: appreciated        Insight:   Good    Judgment:   Good              =================================================  Assessment / Impression:  There are four (4) assessed elements related to decisional capacity:  Understanding: ability to understand and retain the information relevant to the decision;  Expression of choice: ability to use or weigh that information as part of the process of making the decision (ie: manipulate the information);  Appreciation: ability to communicate a decision (whether by talking, using sign language or any other means).   Reasoning: ability to reason rationally (ie: weight against personal values and beliefs)       Understanding  -Patient displays reality testing and has excellent understanding of her limitation, medical and physical needs in regards to her discharge disposition. She perceives that she will require more assistance with ADL's, and recognizes that her uncle is no longer able to manage her needs. She patient believes that SNF can help as she bed bound and there, able to get constant nursing care. The patient clearly understands indications for SNF placement.     Appreciation  -Patient appreciates the details surrounding SNF vs conditions of her current  living situation. She appreciates risks including deterioration of health or even death if she goes back to her living conditions. The patient is able to process information presented to her and able to indicate understanding of it. She appreciates the association between SNF and optimal outcome in her clinical presentation.     Reasoning   -Patient displays rational thinking behind her decision to go to SNF. She reports that she wants to make sure she is safe and continue getting proper care. The patient displays reality testing and demonstrates reality based reasoning.      Expressing a choice  -Meredith Baker states she would like to be discharged to SNF    At this time patient displays capacity to make decision regarding discharge disposition.      Of note, lack of capacity in one area does not imply a global lack of capacity. Moreover, lack of capacity should not be confused with lack of competency (the ability to make global/multiple medical decisions), the later being judicially determined.Capacity for a specific decision can change in situations, one being improvement in mental status.       Patient is aware of the proposed treatment plan and expresses  understanding of the indications, risks and benefits of the intended interventions.    Disposition:   Psychiatry consult team will sign off.  If another consult is needed, please contact us     Thank you for allowing [place] to participate in the care of your patient.    Signed by:    Darlina Guys  Consultation-Liaison Psychiatry  Non-urgent contact: Epic Chat   Urgent or weekend contact: XTend page  Completed: 07/25/22 10:54 PM
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
## 2022-07-25 NOTE — PT Eval Note (Signed)
Physical Therapy Evaluation  Meredith Baker      Post Acute Care Therapy Recommendations:   Discharge Recommendations:  LTC with PT/OT at facility      DME needs IF patient is discharging home: Dressing stick, Long-handled sponge, Sock aid, Long-handled shoehorn, Hospital bed, Grab bars, BSC, Hoyer Lift    Therapy discharge recommendations may change with patient status.  Please refer to most recent note for up-to-date recommendations.    Unit: [facility]  Bed: A2502/A2502-B    ___________________________________________________    Time of Evaluation:  Time Calculation   PT Received On: 07/25/22  Start Time: 0905  Stop Time: 0916  Time Calculation (min): 11 min       Chart Review and Collaboration with Care Team: 5 minutes, not included in above time    PT Visit Number: 1    Consult received for Meredith Baker for PT Evaluation and Treatment.  Patient's medical condition is appropriate for Physical therapy intervention at this time.    Activity Orders:  PT eval and treat and activity as tolerated    Precautions and Contraindications:  Precautions  Weight Bearing Status: no restrictions  Fall Risks: High, Impaired balance/gait, Impaired mobility  Other Precautions: bleeding    Personal Protective Equipment (PPE)  gloves and procedure mask    Medical Diagnosis:  Failure to thrive in adult [R62.7]  NSTEMI (non-ST elevated myocardial infarction) [I21.4]  Urinary incontinence in female [R32]  Pressure injury of skin of other site, unspecified injury stage [L89.899]    History of Present Illness:  Meredith Baker is a 76 y.o. female admitted on 07/23/2022 with PMHx of morbid obesity, type II DM, hypertension, lower extremity DVT on Pradaxa, bedbound, urinary incontinence and large superficial left iliac pressure wound brought to the ED for evaluation of generalized weakness.     Patient Active Problem List   Diagnosis    NSTEMI (non-ST elevated myocardial infarction)       Past Medical/Surgical  History:  Past Medical History:   Diagnosis Date    Diabetes mellitus     History of blood clots     Hypertension      Past Surgical History:   Procedure Laterality Date    TUBAL LIGATION         X-Rays/Tests/Labs:  Lab Results   Component Value Date/Time    HGB 10.2 (L) 07/24/2022 06:38 AM    HCT 33.2 (L) 07/24/2022 06:38 AM    K 3.4 (L) 07/24/2022 06:38 AM    NA 143 07/24/2022 06:38 AM    INR 1.6 (H) 07/24/2022 01:17 AM    TROPI 103.4 (AA) 07/24/2022 06:38 AM    TROPI 108.5 (AA) 07/24/2022 12:26 AM    TROPI calc n/a 07/24/2022 12:26 AM    TROPI 92.0 (AA) 07/23/2022 04:48 PM       All imaging reviewed, please see chart for details.    Social History:  Prior Level of Function  Prior level of function: Needs assistance with ADLs, Bedbound / Total Care  Baseline Activity Level: No independent activity  Ambulated 100 feet or more prior to admission: No  Driving: does not drive  Cooking: No  Employment: Retired  DME Currently at [employer]: Wheelchair, [place]: Family members  Type of Home: [place]: Two level, Able to live on main level with bedroom/bathroom, Stairs to enter with rails (add number in comment) (4 STE, basement level only)  Bathroom Shower/Tub: Tub/shower  unit (changing to walk in)  Bathroom Toilet: Standard  DME Currently at Home: Wheelchair, Manual  Home Living - Notes / Comments: pt lives w/ 30 y/o friend/family member. reports he is unable to assist pt. pt has increasingly become more weak. initially able to transfer supine>sit EOB. now spends all time in bed. reports needing total assist with bathing. pt can self feed and brush teeth    Subjective:  Patient is agreeable to participation in the therapy session. Nursing clears patient for therapy.     Pain Assessment  Pain Assessment:  (reports some pain/numbness in LLE)          Objective:  Observation of Patient/Vital Signs:  Blood pressure 155/81, pulse 75, temperature 97.3 F (36.3 C),  temperature source Oral, resp. rate 16, height 1.575 m ('5\' 2"'$ ), weight 103.1 kg (227 lb 4.8 oz), SpO2 97 %.      Cognitive Status and Neuro Exam:  Cognition/Neuro Status  Arousal/Alertness: Appropriate responses to stimuli  Attention Span: Appears intact  Orientation Level: Oriented X4  Memory: Appears intact  Following Commands: Follows all commands and directions without difficulty  Safety Awareness: independent  Insights: Fully aware of deficits  Problem Solving: Able to problem solve independently  Behavior: cooperative  Motor Planning: decreased initiation;decreased processing speed    Musculoskeletal Examination  Gross ROM  Right Upper Extremity ROM: within functional limits  Left Upper Extremity ROM: within functional limits  Right Lower Extremity ROM: needs focused assessment  Right Lower Extremity ROM % reduced: reduced by 75% (PROM WFL, AROM very limited)  Left Lower Extremity ROM: needs focused assessment  Left Lower Extremity ROM % Reduced: reduced by 75% (PROM WFL, AROM very limited)  Gross ROM  Right Upper Extremity ROM: within functional limits  Left Upper Extremity ROM: within functional limits  Right Lower Extremity ROM: needs focused assessment  Right Lower Extremity ROM % reduced: reduced by 75% (PROM WFL, AROM very limited)  Left Lower Extremity ROM: needs focused assessment  Left Lower Extremity ROM % Reduced: reduced by 75% (PROM WFL, AROM very limited)         Functional Mobility:  Functional Mobility  Scooting to HOB: Stand by Assist;Increased Time;Increased Effort  Transfers  Bed to Chair: Unable to assess (Comment)  Locomotion  Ambulation: Unable to assess (Comment) (PLOF only bed level)      Participation and Activity Tolerance  Participation and Endurance  Participation Effort: fair  Endurance: Tolerates 10 - 20 min exercise with multiple rests  [facility] Dyspnea Scale: 1+ Dyspnea    Treatment:   Obtained PLOF and home set up from patient. Assessed gross ROM and strength. Noted  incr limitation of strength/ROM on LLE>RLE. Educated pt on HEP. Pt performed 10x ankle pumps on RLE. Required PROM on LLE. Performed 10x BLE heel slides, quad sets and glut sets. Pt demonstrated/verbalized understanding. Pt stating incr pain from recently participating in bed mobility w/ nursing. Further activity deferred. Pt would continue to benefit from PT to improve functional mobility and prepare for safe d/c.      Educated the Patient to role of physical therapy, plan of care, goals of therapy and safety with mobility and ADLs, discharge instructions, home safety with verbalized understanding  and demonstrated understanding.    Patient left in bed with alarm and all other medical equipment in place and call bell and all personal items/needs within reach.  RN notified of session outcome. Notified CM team and attending of d/c recommendation via secure chat.  Assessment:  Meredith Baker is a 76 y.o. female admitted 07/23/2022. PT Assessment  Assessment: Decreased LE ROM;Decreased LE strength;Decreased endurance/activity tolerance;Impaired motor control;Decreased functional mobility;Decreased balance;Gait impairment;Impaired coordination  Prognosis: Fair;With continued PT status post acute discharge  Progress: Slow progress, decreased activity tolerance      Plan:  Treatment/Interventions: Exercise, Neuromuscular re-education, Functional transfer training, LE strengthening/ROM, Bed mobility, Compensatory technique education  PT Frequency: 2-3x/wk  Risks/Benefits/POC Discussed with Pt/Family: With patient    PMP Activity: Step 2 - Supine Exercises  Distance Walked (ft) (Step 6,7): 0 Feet      Goals:  Goals  Goal Formulation: With patient  Time for Goal Acheivement: By time of discharge  Goals: Select goal  Pt Will Roll Right: with minimal assist, to maximize functional mobility and independence  Pt Will Go Supine To Sit: with moderate assist, to maximize functional mobility and independence  Pt Will Achieve  Sitting Balance: 3/5 sits without UE support up to 30 seconds, to maximize functional mobility and independence  Pt Will Perform Home Exer Program: independent, to maximize functional mobility and independence    Meredith Baker, PT, DPT  07/25/2022  10:35 AM    Physical Therapist  Physical Medicine and Rehabilitation  [place], [place], Thurs 8-4:30pm  [place] Fri 12:30-9:00pm  [facility]  Patient: Meredith Baker MRN#: [id number]  Unit: [facility] Bed: A2502/A2502-B

## 2022-07-25 NOTE — Provider Clarification Note (Signed)
Patient Name: Meredith Baker, Meredith Baker  Account #: [id number]   MR #: [id number]  Discharge Date:            Thank youCaryn Section Mahmoodi  Date:  07/25/2022        PROVIDER RESPONSE (Choose from list above or add free text): Sepsis versus other causes for tachycardia and leukocytosis being evaluated at this time.  Unable to determine.

## 2022-07-25 NOTE — UM Notes (Signed)
07/25/22    Temp:  [97.2 F (36.2 C)-97.9 F (36.6 C)] 97.2 F (36.2 C)  Heart Rate:  [70-79] 70  Resp Rate:  [15-18] 18  BP: (124-155)/(69-81) 141/77     Scheduled Meds:  Current Facility-Administered Medications   Medication Dose Route Frequency    aspirin  81 mg Oral Daily    [START ON 07/26/2022] balsam peru-castor oil (VENELEX)   Topical Q12H    dabigatran  150 mg Oral Q12H [place]    insulin lispro  1-3 Units Subcutaneous QHS    insulin lispro  1-5 Units Subcutaneous TID AC    losartan (COZAAR) 100 mg, hydroCHLOROthiazide (HYDRODIURIL) 25 mg for HYZAAR   Oral Daily    metoprolol succinate  200 mg Oral Daily    miconazole 2 % with zinc oxide   Topical Q12H    piperacillin-tazobactam  4.5 g Intravenous Q6H    vancomycin  750 mg Intravenous Q12H    vancomycin   Intravenous See Admin Instructions     Continuous Infusions:   sodium chloride 100 mL/hr at 07/24/22 2200     PRN Meds:.acetaminophen, benzocaine-menthol, benzonatate, artificial tears (REFRESH PLUS), dextrose **OR** dextrose **OR** dextrose **OR** glucagon (rDNA), magnesium sulfate, melatonin, naloxone, potassium & sodium phosphates, potassium chloride **AND** potassium chloride, saline      Latest Reference Range & Units 07/24/22 06:38 07/24/22 19:32 07/25/22 04:02 07/25/22 10:07 07/25/22 12:03   WBC 3.10 - 9.50 x10 3/uL 11.91 (H)       Hemoglobin 11.4 - 14.8 g/dL 10.2 (L)       Hematocrit 34.7 - 43.7 % 33.2 (L)       Platelet Count 142 - 346 x10 3/uL 377 (H)       RBC 3.90 - 5.10 x10 6/uL 3.29 (L)       MCV 78.0 - 96.0 fL 100.9 (H)       MCHC 31.5 - 35.8 g/dL 30.7 (L)       RDW 11 - 15 % 20 (H)       Instrument Absolute Neutrophil Count 1.10 - 6.33 x10 3/uL 9.07 (H)       Monocytes Absolute Automated 0.21 - 0.85 x10 3/uL 0.95 (H)       Neutrophils Absolute 1.10 - 6.33 x10 3/uL 9.07 (H)       Glucose 70 - 100 mg/dL 175 (H)       Whole Blood Glucose POCT 70 - 100 mg/dL    142 (H) 148 (H)   Potassium 3.5 - 5.3 mEq/L 3.4 (L)       Lactic Acid 0.2 - 2.0  mmol/L 2.6 (H)       Albumin 3.5 - 5.0 g/dL 1.7 (L)       Protein Total 6.0 - 8.3 g/dL 4.3 (L)       Albumin/Globulin Ratio 0.9 - 2.2  0.7 (L)       TSH 0.35 - 4.94 uIU/mL   0.33 (L)     hs Troponin-I SEE BELOW ng/L 103.4 !!       Clarity, UA Clear - Hazy   Turbid !      Leukocyte Esterase, UA Negative   Moderate !      Protein, UR Negative   30= 1+ !      Blood, UA Negative   Large !      RBC UA 0 - 5 /hpf  TNTC !      WBC, UA 0 - 5 /hpf  TNTC !      !!:  Data is critical  (H): Data is abnormally high  (L): Data is abnormally low  !: Data is abnormal  atient is alert and oriented, generalized weakness, denied pain, turning and repositioning done. Antibiotics and iv fluid continue, no acute distress noted, continue to monitor the Patient.    Plan:   Troponin I level to peak.  Lipid panel.  Thyroid function test.  CT chest with contrast rule out PE.  X-ray of the left hip.  Aspirin 81 mg p.o. once a day.  Atorvastatin 40 mg p.o. once a day.  Toprol-XL 50 mg twice a day.  Discontinue amlodipine.  Losartan /chlorothiazide.  Workup for CAD once medically stable.

## 2022-07-25 NOTE — Malnutrition Assessment (Signed)
Meredith Baker is a 76 y.o. female patient.   [id number]    Malnutrition Assessment   Malnutrition Documentation    Moderate Malnutrition related to inadequate nutritional  intake in the setting of social/environmental circumstances as evidenced by <50%EER x 1 month, mild muscle losses (temporalis, pectorilism deltoid)        Carson Myrtle, RDN      If physician disagrees with this assessment see addendum.

## 2022-07-25 NOTE — Progress Notes (Signed)
Nutritional Support Services  Nutrition Assessment    Meredith Baker 76 y.o. female   MRN: [id number]    Summary of Nutrition Recommendations:    1. Continue CCHO dier, cobsider liberalizing if poor PO continues    2. Add Ensure Plus High Protein 1 bottle PO TID to optimize nutritional intake   Each Ensure Plus HP provides 350 kcals, 20g protein    3. Daily weights    -----------------------------------------------------------------------------------------------------------------    D/w RN                                                        Assessment Data:   Referral Source: RN screen  Reason for Referral: MST - 2 (weight loss, decreased appetite)    Nutrition: Pt seen at bedside, awake. Pt reports decreased intake over the past month d/t bedbound nature and housemate not preparing meals for her. Pt reports receiving an Ensure or bowl of cereal every once in a while, but would often go the entire day without food. Pt reports decreased appetite likely d/t decreased food availability. Pt denies N/V/D/C.  Noted some missing teeth, pt reports foods like chicken can be difficult to eat. Pt also reports taste changes, water tastes bitter and sweet/salty foods are more intense. Pt with meal tray at bedside, 25-50% consumed, pt reports full appetite has not yet returned.    Learning Needs: Encouraged pt to consume blander foods for increased tolerance    Hospital Admission: 47 female with PMHx DM, presenting with generalized weakness.    Medical Hx:  has a past medical history of Diabetes mellitus, History of blood clots, and Hypertension.    PSH: has a past surgical history that includes Tubal ligation.     Orders Placed This Encounter   Procedures    Diet consistent carbohydrate    Ensure Plus High Protein Supplement Quantity: A. One; Flavor: Vanilla; Frequency: TID (3 times a day) with meals       Intake:   07/24/22 1000 07/24/22 1400 07/24/22 1800   Intake (mL)   Percent Meal Consumed (%) 25% 50% 25%          ANTHROPOMETRIC  Height: 157.5 cm ('5\' 2"'$ )  Weight: 103.1 kg (227 lb 4.8 oz)  Weight Change: 0.2  Body mass index is 41.57 kg/m.      Weight Monitoring     Weight Weight Method   07/23/2022 100.336 kg  Bed Scale    07/24/2022 102.9 kg  Bed Scale     102.9 kg  Bed Scale     103.103 kg          Weight History Summary: Pt was not weighing herself, weight changes unknown    ESTIMATED NEEDS    Total Daily Energy Needs: 1546.5 to 2062 kcal  Method for Calculating Energy Needs: 15 kcal - 20 kcal per kg  at 103.1 kg (Actual body weight)  Rationale: obese, noncritical       Total Daily Protein Needs: 78.3 to 104.4 g  Method for Calculating Protein Needs: 1.5 g - 2 g per kg at 52.2 kg (Ideal body weight)  Rationale: obese, noncritical      Total Daily Fluid Needs: 1148.4 to 1409.4 ml  Method for Calculating Fluid Needs: 22 ml - 27 ml  per kg at 52.2 kg (Ideal body weight)  Rationale: bmi, age, or per md      Pertinent Medications:  SSI, vanco, zosyn    IVF:     sodium chloride 100 mL/hr at 07/24/22 2200       No Known Allergies      Pertinent labs:  Recent Labs   Lab 07/24/22  [id number] 07/23/22  1823   Sodium 143 143   Potassium 3.4* 3.7   Chloride 109 108   CO2 24 24   BUN 13.0 12.0   Creatinine 0.7 0.6   Glucose 175* 220*   Calcium 8.6 8.5   eGFR >60.0 >60.0           Physical Assessment: July 25, 2022  Head: temple region: slight depression with decrease in muscle tone/resistance (mild muscle loss - temporalis), orbital region: slightly dark circles, somewhat hollow look, some decrease in bounce back of fat pads (mild fat loss), and buccal region: full, round, filled-out cheeks, ample bounce back of fat pads (no wasting observed)  Upper Body: clavicle bone region: some protrusion of the clavicle with decrease in muscle tone/resistance (mild muscle loss - pectoralis major), shoulder and acromion bone region: slight protrusion of acromion process, decrease in muscle tone/resistance (mild muscle loss - deltoid), upper  arm region: ample fat tissue between fingers (no wasting observed), and dorsal hand region: muscle may bulge or be flat, no depression, feel muscle tone/resistance (no wasting observed)  Lower Body: no s/s subcutaneous fat or muscle loss and edema: deep to very deep pitting, indentation lasts 31 to >60 seconds, significant swelling (3-4+) (severe edema - BLE)  Skin: left flank wound  GI function: Last BM Date: 07/25/22                                                                Nutrition Diagnosis      Moderate Malnutrition related to inadequate nutritional  intake in the setting of social/environmental circumstances as evidenced by <50%EER x 1 month, mild muscle losses (temporalis, pectorilism deltoid) - new                                                               Intervention     Nutrition recommendation - Please refer to top of note                                                                  Monitoring/Evaluation     Goals:     1. Patient will consume >/=75% of nutritional needs via meals by next RD follow up - new        Nutrition Risk Level: High (will follow up at least 2 times per week and PRN)      Parke Simmers, RD, [place] [facility]  [phone]

## 2022-07-25 NOTE — Progress Notes (Signed)
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 once a day  Atorvastatin 40 mg p.o. once a day  Discontinue metoprolol  Start carvedilol 12.5 mg p.o. twice a day  Losartan/hydrochlorothiazide 100/25 mg p.o. once a day  CBC, BMP, and BNP level  Work-up for CAD once medically stable  Had extensive conversation with patient's niece at bedside about patient's social situation -- social work may be able to provide assistance     Subjective/chief complaint:   Patient is seen and examined  All medications and labs reviewed    Patient states that she used to get exertional chest pain when she would walk through the grocery store earlier in the year, but she has been bedbound since March 2023.           Medications:     Current Facility-Administered Medications   Medication Dose Route Frequency    aspirin  81 mg Oral Daily    dabigatran  150 mg Oral Q12H SCH    insulin lispro  1-3 Units Subcutaneous QHS    insulin lispro  1-5 Units Subcutaneous TID AC    losartan (COZAAR) 100 mg, hydroCHLOROthiazide (HYDRODIURIL) 25 mg for HYZAAR   Oral Daily    metoprolol succinate  200 mg Oral Daily    piperacillin-tazobactam  4.5 g Intravenous Q6H    vancomycin  750 mg Intravenous Q12H    vancomycin   Intravenous See Admin Instructions       Review of Systems:   General ROS: no weight loss, no weight gain, no fever, no chills  Hematological and Lymphatic ROS: negative  Endocrine ROS:  no fatigue, no polyuria, no polydipsia, no general weakness  Respiratory ROS: no shortness of breath, no cough, no wheezes and no hemoptysis  Cardiovascular ROS: no chest pain, no palpitations, no PND and no orthopnea, no DOE  Gastrointestinal ROS:no nausea, no vomiting, no diarrhea, no constipation, no blood in stool and no abdominal pain  Musculoskeletal ROS: no muscle pain, no muscle weakness, no joint pain, no swelling and no redness  Neurological ROS: no headache, no dizziness, no diplopia, no focal weakness, no seizure  Dermatological ROS: no rash, no itching and no ecchymoses, no pressure ulcer      Physical Exam:   BP 155/81   Pulse 75   Temp 97.3 F (36.3 C) (Oral)   Resp 16   Ht 1.575 Parys Elenbaas ('5\' 2"'$ )   Wt 103.1 kg (227 lb 4.8 oz)   SpO2 97%   BMI 41.57 kg/Ian Castagna       Intake/Output Summary (Last 24 hours) at 07/25/2022 1114  Last data filed at 07/24/2022 1934  Gross per 24 hour   Intake 600 ml   Output 500 ml   Net 100 ml       General appearance - alert, ill appearing, and in no distress  Mental status - alert, oriented to  person, place, and time  HEENT:normocephalic, no icterus, no pallor, no cyanosis  Neck: supple, no JVD, no thyromegaly, no carotid bruits  Chest - clear to auscultation, no wheezes, rales or rhonchi, symmetric air entry  Heart - normal rate, regular rhythm, normal S1, S2, no S3 ,+murmurs, rubs, clicks or gallops  Abdomen - soft, nontender, nondistended, no masses or organomegaly  Neurological - alert, oriented, normal speech, no focal findings  noted  Musculoskeletal - + joint tenderness, +deformity   Extremities - peripheral pulses palpable,trace pedal edema, no clubbing or cyanosis  Skin - normal color , no rashes.    Labs:     CBC w/Diff   Recent Labs   Lab 07/24/22  0638 07/23/22  1648   WBC 11.91* 11.16*   Hgb 10.2* 12.1   Hematocrit 33.2* 38.5   Platelets 377* 461*          Basic Metabolic Profile   Recent Labs   Lab 07/24/22  0638 07/23/22  1823   Sodium 143 143   Potassium 3.4*  3.7   Chloride 109 108   CO2 24 24   BUN 13.0 12.0   Creatinine 0.7 0.6   Calcium 8.6 8.5   ALT 7 11   AST (SGOT) 12 16   Glucose 175* 220*          Cardiac Enzymes   Recent Labs   Lab 07/24/22  0638 07/24/22  0026 07/23/22  2218 07/23/22  1648   Creatine Kinase (CK)  --   --  43  --    hs Troponin-I 103.4* 108.5*  --  92.0*   hs Troponin-I Delta  --  calc n/a  --   --        No results found for: "BNP"       Thyroid Studies    Recent Labs   Lab 07/25/22  0402   TSH 0.33*           Lipid Profile            Radiology Results (24 Hour)       Procedure Component Value Units Date/Time    US Venous Low Extrem Duplex [employer] Bilat [id number] Collected: 07/24/22 1714    Order Status: Completed Updated: 07/24/22 1720    Narrative:      BILATERAL LOWER EXTREMITY VENOUS DUPLEX EXAM PERFORMED 07/24/2022    HISTORY: 76 year old female with left lower extremity edema.     PROCEDURE: Real-time duplex examination of the bilateral lower extremity  venous system was performed. Both compression techniques and color Doppler  techniques were utilized.    Permanent ultrasound images were stored in the patient's electronic medical  record.    FINDINGS:    The bilateral external iliac veins demonstrate normal phasic flow.    There is nonocclusive thrombus within the right common femoral vein, and  saphenofemoral junction. The mid-distal right femoral vein is patent. The  right popliteal vein is patent.    The right anterior tibial veins, peroneal veins, and posterior tibial veins  are patent.    There is nonocclusive thrombus within the left common femoral vein, left  femoral vein and extending to the left popliteal vein, left peroneal veins,  and proximal left posterior tibial veins, with associated mild soft tissue  swelling/edema. The right anterior tibial vein is patent.      Impression:           Left femoral-popliteal DVT extending to the left peroneal veins and  posterior tibial veins, with associated mild soft tissue  swelling/edema.    Short segment nonocclusive thrombus within the right common femoral vein.    Oley Balm, MD  07/24/2022 5:18 PM                Jerilynn Mages Clarita Crane, MD  Renata Caprice, PA, PA-C  07/25/2022 11:14 AM

## 2022-07-25 NOTE — Plan of Care (Signed)
Problem: Everyday - Pneumonia  Goal: Stable Vital Signs and Fluid Balance  Outcome: Progressing  Flowsheets (Taken 07/25/2022 2041)  Stable Vital Signs and Fluid Balance:   Monitor/ assess vital signs and telemetry per policy   Monitor labs and report abnormalities to physicians   Oxygen as needed- wean per guideline([facility] only)     Problem: Safety  Goal: Patient will be free from injury during hospitalization  Outcome: Progressing  Flowsheets (Taken 07/25/2022 2043)  Patient will be free from injury during hospitalization:   Assess patient's risk for falls and implement fall prevention plan of care per policy   Provide and maintain safe environment   Use appropriate transfer methods     Problem: Safety  Goal: Patient will be free from infection during hospitalization  Outcome: Progressing     Problem: Moderate/High Fall Risk Score >5  Goal: Patient will remain free of falls  Flowsheets (Taken 07/25/2022 2038)  Moderate Risk (6-13):   MOD-Floor mat at bedside (where available) if appropriate   MOD-Consider activation of bed alarm if appropriate     Problem: Hemodynamic Status: Cardiac  Goal: Stable vital signs and fluid balance  Outcome: Progressing  Flowsheets (Taken 07/25/2022 2043)  Stable vital signs and fluid balance:   Assess signs and symptoms associated with cardiac rhythm changes   Monitor lab values   AXOX3, with some confusion, non compliant at times. MAE with generalized VSS SR Ra, no distress.  Lactic 2.2, Mg 1, K 3.4.   Hospitalist was aware, ordered electrolyte replacement protocol , NS 75 ml/h.  Continue monitoring Lactic, labs, ABT.   CTA and echo pending.

## 2022-07-25 NOTE — Progress Notes (Signed)
Progress Note- Pharmacy Vancomycin Dosing Consult:  Day 2  Vancomycin Indication: Sepsis  Vancomycin Monitoring Goal: AUC/MIC 400- 600 mg*hr/L       Imaging:  11/25 X-ray chest: 1. Small left pleural effusion. Dense left basilar airspace opacity suspicious for pneumonia.     Pertinent Cultures:     Date Source  Organism & Pertinent Susceptibilities     11/26 MRSA Pending   11/25 BC x 2 NGTD x 1d             As appropriate, contact physician to consider change in therapy if cultures grow organism other than MRSA      Serum creatinine: 0.7 mg/dL 07/24/22 [id number]  Estimated creatinine clearance: 79.4 mL/min    Baseline SCr: 0.6-0.7    Nephrotoxic drugs administered in the past 48 hours: Advanced Age (>65Y), Piperacillin/tazobactam    Assessment (Copy summary from [employer] for AUC/MIC dosing)  Measured Vancomycin Level:   Recent Labs   Lab 07/25/22  0402   Vancomycin Random 14.2       Analysis of the most recent level(s) using [facility] gives the following patient-specific pharmacokinetic parameters:    CL: 3.32 L/h   V: 28.4 L   T1/2: 17.5 hours    Using these values, the current regimen of Vancomycin 750 mg IV every 12 hours is predicted to result in a steady-state trough of 15.8 mg/L and AUC24 of 435 mg/L.hr.  At this time we recommend a regimen of 750 mg IV every 12 hours, which is predicted to result in a steady-state trough of 15.8 mg/L and AUC24 of 435 mg/L.hr.    Recommendations:  Continue Maintenance 750 mg Q 12 hr  New Vancomycin level:   (Include if level is random or trough, two-level or single-level, and date/time)   12/2 Random w/ am labs or sooner based on patient's renal fxn    Thank you,  Kennon Holter, [facility], PharmD, [place]
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
## 2022-07-25 NOTE — Provider Clarification Note (Signed)
Patient Name: Amoya, Fazzone  Account #: [id number]   MR #: [id number]  Discharge Date:            Thank youCaryn Section Mahmoodi  Date:  07/25/2022        PROVIDER RESPONSE (Choose from list above or add free text): Likely pyuria, urine culture still pending however

## 2022-07-26 ENCOUNTER — Inpatient Hospital Stay: Payer: Medicare HMO

## 2022-07-26 DIAGNOSIS — I214 Non-ST elevation (NSTEMI) myocardial infarction: Secondary | ICD-10-CM

## 2022-07-26 DIAGNOSIS — R627 Adult failure to thrive: Secondary | ICD-10-CM

## 2022-07-26 LAB — ECHO ADULT TTE COMPLETE
AV Area (Cont Eq VTI): 2.53
AV Area (Cont Eq VTI): 2.543
AV Mean Gradient: 4
AV Mean Gradient: 4
AV Mean Gradient: 5
AV Mean Gradient: 5
AV Mean Gradient: 5
AV Peak Velocity: 1.45
AV Peak Velocity: 1.47
AV Peak Velocity: 1.49
AV Peak Velocity: 1.52
AV Peak Velocity: 1.52
IVS Diastolic Thickness (2D): 1.78
LA Dimension (2D): 4.5
LA Dimension (2D): 4.8
LA Volume Index (BP A-L): 44
LVID diastole (2D): 4.04
LVID systole (2D): 2.79
MV Area (PHT): 2.113
MV E/A: 0.7
MV E/A: 0.716
MV Mean Gradient: 4
Prox Ascending Aorta Diameter: 2.9
Pulmonary Valve Findings: NORMAL
RV Basal Diastolic Dimension: 3.49
RV Function: NORMAL
RV Systolic Pressure: 33.692
RV Systolic Pressure: 34
Site RA Size (AS): NORMAL
Site RV Size (AS): NORMAL
TAPSE: 1.55
TAPSE: 2.12
Tricuspid Valve Findings: NORMAL

## 2022-07-26 LAB — VITAMIN B12: Vitamin B-12: 1561 pg/mL — ABNORMAL HIGH (ref 211–911)

## 2022-07-26 LAB — ANTI-XA,UFH: Anti-Xa, UFH: 0.04 IU/mL

## 2022-07-26 LAB — CBC
Absolute NRBC: 0 10*3/uL (ref 0.00–0.00)
Absolute NRBC: 0 10*3/uL (ref 0.00–0.00)
Hematocrit: 26.4 % — ABNORMAL LOW (ref 34.7–43.7)
Hematocrit: 30.5 % — ABNORMAL LOW (ref 34.7–43.7)
Hgb: 8.2 g/dL — ABNORMAL LOW (ref 11.4–14.8)
Hgb: 9.3 g/dL — ABNORMAL LOW (ref 11.4–14.8)
MCH: 31.7 pg (ref 25.1–33.5)
MCH: 30.3 pg (ref 25.1–33.5)
MCHC: 31.1 g/dL — ABNORMAL LOW (ref 31.5–35.8)
MCHC: 30.5 g/dL — ABNORMAL LOW (ref 31.5–35.8)
MCV: 101.9 fL — ABNORMAL HIGH (ref 78.0–96.0)
MCV: 99.3 fL — ABNORMAL HIGH (ref 78.0–96.0)
MPV: 9.9 fL (ref 8.9–12.5)
MPV: 9 fL (ref 8.9–12.5)
Nucleated RBC: 0 /100 WBC (ref 0.0–0.0)
Nucleated RBC: 0 /100 WBC (ref 0.0–0.0)
Platelets: 464 10*3/uL — ABNORMAL HIGH (ref 142–346)
Platelets: 481 10*3/uL — ABNORMAL HIGH (ref 142–346)
RBC: 2.59 10*6/uL — ABNORMAL LOW (ref 3.90–5.10)
RBC: 3.07 10*6/uL — ABNORMAL LOW (ref 3.90–5.10)
RDW: 20 % — ABNORMAL HIGH (ref 11–15)
RDW: 20 % — ABNORMAL HIGH (ref 11–15)
WBC: 8.75 10*3/uL (ref 3.10–9.50)
WBC: 8.99 10*3/uL (ref 3.10–9.50)

## 2022-07-26 LAB — GLUCOSE WHOLE BLOOD - POCT
Whole Blood Glucose POCT: 155 mg/dL — ABNORMAL HIGH (ref 70–100)
Whole Blood Glucose POCT: 150 mg/dL — ABNORMAL HIGH (ref 70–100)
Whole Blood Glucose POCT: 230 mg/dL — ABNORMAL HIGH (ref 70–100)

## 2022-07-26 LAB — LACTIC ACID, PLASMA
Lactic Acid: 2.2 mmol/L — ABNORMAL HIGH (ref 0.2–2.0)
Lactic Acid: 1.7 mmol/L (ref 0.2–2.0)

## 2022-07-26 LAB — MRSA CULTURE
Culture MRSA Surveillance: NEGATIVE
Culture MRSA Surveillance: NEGATIVE

## 2022-07-26 LAB — BASIC METABOLIC PANEL
Anion Gap: 7 (ref 5.0–15.0)
BUN: 10 mg/dL (ref 7.0–21.0)
CO2: 24 mEq/L (ref 17–29)
Calcium: 7.9 mg/dL (ref 7.9–10.2)
Chloride: 111 mEq/L (ref 99–111)
Creatinine: 0.7 mg/dL (ref 0.4–1.0)
Glucose: 174 mg/dL — ABNORMAL HIGH (ref 70–100)
Potassium: 3.4 mEq/L — ABNORMAL LOW (ref 3.5–5.3)
Sodium: 142 mEq/L (ref 135–145)
eGFR: >60 mL/min/{1.73_m2} (ref >=60)

## 2022-07-26 LAB — URINALYSIS REFLEX TO MICROSCOPIC EXAM - REFLEX TO CULTURE
Bilirubin, UA: NEGATIVE
Glucose, UA: NEGATIVE
Ketones UA: NEGATIVE
Nitrite, UA: NEGATIVE
Protein, UR: NEGATIVE
Specific Gravity UA: 1.023 (ref 1.001–1.035)
Urine pH: 6 (ref 5.0–8.0)
Urobilinogen, UA: NORMAL mg/dL (ref 0.2–2.0)

## 2022-07-26 LAB — LIPID PANEL
Cholesterol / HDL Ratio: 3.5 Index
Cholesterol: 92 mg/dL (ref 0–199)
HDL: 26 mg/dL — ABNORMAL LOW (ref 40–9999)
LDL Calculated: 42 mg/dL (ref 0–99)
Triglycerides: 122 mg/dL (ref 34–149)
VLDL Calculated: 24 mg/dL (ref 10–40)

## 2022-07-26 LAB — RETICULOCYTES
Immature Retic Fract: 28.6 % — ABNORMAL HIGH (ref 1.2–15.6)
Reticulocyte Count Absolute: 0.093 10*6/uL (ref 0.0220–0.1420)
Reticulocyte Count Automated: 3.3 % — ABNORMAL HIGH (ref 0.8–2.3)
Reticulocyte Hemoglobin: 28.1 pg — ABNORMAL LOW (ref 28.4–40.2)

## 2022-07-26 LAB — IRON PROFILE
Iron Saturation: 47 % (ref 15–50)
Iron: 53 ug/dL (ref 32–157)
TIBC: 113 ug/dL — ABNORMAL LOW (ref 265–497)
UIBC: 60 ug/dL — ABNORMAL LOW (ref 126–382)

## 2022-07-26 LAB — FOLATE: Folate: 3.6 ng/mL

## 2022-07-26 LAB — DIRECT COOMBS - C3 REFLEX: Anti-C3 Coombs: NEGATIVE

## 2022-07-26 LAB — MAGNESIUM: Magnesium: 1 mg/dL — ABNORMAL LOW (ref 1.6–2.6)

## 2022-07-26 LAB — DIRECT ANTIGLOBULIN TEST: Direct Coombs, IgG: POSITIVE

## 2022-07-26 LAB — HEMOLYSIS INDEX: Hemolysis Index: 5 Index (ref 0–24)

## 2022-07-26 LAB — C-REACTIVE PROTEIN: C-Reactive Protein: 6.3 mg/dL — ABNORMAL HIGH (ref 0.0–1.1)

## 2022-07-26 LAB — HAPTOGLOBIN: Haptoglobin: 244 mg/dL (ref 35–250)

## 2022-07-26 LAB — PROBNP: NT-proBNP: 4800 pg/mL — ABNORMAL HIGH (ref 0–450)

## 2022-07-26 LAB — HEMOGLOBIN A1C
Average Estimated Glucose: 125.5 mg/dL
Hemoglobin A1C: 6 % — ABNORMAL HIGH (ref 4.6–5.6)

## 2022-07-26 LAB — FERRITIN: Ferritin: 943.6 ng/mL — ABNORMAL HIGH (ref 4.60–204.00)

## 2022-07-26 LAB — APTT: PTT: 62 s — ABNORMAL HIGH (ref 27–39)

## 2022-07-26 LAB — LACTATE DEHYDROGENASE: LDH: 276 U/L (ref 120–331)

## 2022-07-26 LAB — HEMOGLOBIN AND HEMATOCRIT, BLOOD
Hematocrit: 28.5 % — ABNORMAL LOW (ref 34.7–43.7)
Hgb: 8.8 g/dL — ABNORMAL LOW (ref 11.4–14.8)

## 2022-07-26 LAB — SEDIMENTATION RATE: Sed Rate: 65 mm/Hr — ABNORMAL HIGH (ref 0–20)

## 2022-07-26 MED ORDER — SODIUM CHLORIDE 0.9 % IV SOLN
INTRAVENOUS | Status: DC
Start: 2022-07-26 — End: 2022-07-26

## 2022-07-26 MED ORDER — HEPARIN (PORCINE) IN D5W 50-5 UNIT/ML-% IV SOLN (UNITS/KG/HR ONLY)
18.0000 [IU]/kg/h | INTRAVENOUS | Status: DC
Start: 2022-07-26 — End: 2022-07-27
  Administered 2022-07-26: 18 [IU]/kg/h via INTRAVENOUS
  Administered 2022-07-27: 14 [IU]/kg/h via INTRAVENOUS
  Filled 2022-07-26 (×2): qty 500

## 2022-07-26 MED ORDER — POTASSIUM CHLORIDE 10 MEQ/100ML IV SOLN
10.0000 meq | INTRAVENOUS | Status: DC | PRN
Start: 2022-07-26 — End: 2022-08-14
  Administered 2022-07-26: 10 meq via INTRAVENOUS
  Filled 2022-07-26: qty 100

## 2022-07-26 MED ORDER — POTASSIUM & SODIUM PHOSPHATES 280-160-250 MG PO PACK
2.0000 | PACK | ORAL | Status: DC | PRN
Start: 2022-07-26 — End: 2022-08-14

## 2022-07-26 MED ORDER — SODIUM CHLORIDE 0.9 % IV SOLN
INTRAVENOUS | Status: AC
Start: 2022-07-26 — End: 2022-07-26

## 2022-07-26 MED ORDER — HEPARIN SODIUM (PORCINE) 5000 UNIT/ML IJ SOLN
40.0000 [IU]/kg | INTRAMUSCULAR | Status: DC | PRN
Start: 2022-07-26 — End: 2022-07-27

## 2022-07-26 MED ORDER — IOHEXOL 350 MG/ML IV SOLN
100.0000 mL | Freq: Once | INTRAVENOUS | Status: AC | PRN
Start: 2022-07-26 — End: 2022-07-26
  Administered 2022-07-26: 100 mL via INTRAVENOUS

## 2022-07-26 MED ORDER — STERILE WATER FOR INJECTION IJ/IV SOLN (WRAP)
1.0000 g | INTRAMUSCULAR | Status: AC
Start: 2022-07-26 — End: 2022-07-27
  Administered 2022-07-26 – 2022-07-27 (×2): 1 g via INTRAVENOUS
  Filled 2022-07-26 (×2): qty 1000

## 2022-07-26 MED ORDER — HEPARIN SODIUM (PORCINE) 5000 UNIT/ML IJ SOLN
80.0000 [IU]/kg | INTRAMUSCULAR | Status: DC | PRN
Start: 2022-07-26 — End: 2022-07-27

## 2022-07-26 MED ORDER — POTASSIUM CHLORIDE CRYS ER 20 MEQ PO TBCR
0.0000 meq | EXTENDED_RELEASE_TABLET | ORAL | Status: DC | PRN
Start: 2022-07-26 — End: 2022-08-14
  Administered 2022-07-26: 40 meq via ORAL
  Filled 2022-07-26: qty 2

## 2022-07-26 MED ORDER — MAGNESIUM SULFATE IN D5W 1-5 GM/100ML-% IV SOLN
1.0000 g | INTRAVENOUS | Status: DC | PRN
Start: 2022-07-26 — End: 2022-08-14
  Administered 2022-07-26 – 2022-08-11 (×10): 1 g via INTRAVENOUS
  Filled 2022-07-26 (×9): qty 100

## 2022-07-26 NOTE — Progress Notes (Signed)
[facility]  PROGRESS NOTE      Patient: Meredith Baker  Date: 07/26/2022   LOS: 2 Days  Admission Date: 07/23/2022   MRN: [id number]  Attending: Arley Phenix, MD  When on service as the attending, please contact me on [employer] from 7 AM - 7 PM for non-urgent issues. For urgent matters use XTend page from 7 AM - 7 PM.       ASSESSMENT/PLAN     Meredith Baker is a 76 y.o. female admitted with NSTEMI (non-ST elevated myocardial infarction)    Interval Summary:   76 years old female with history of hypertension, type 2 diabetes, hyperlipidemia, osteoarthritis, bedbound, history of bilateral DVT who presented for evaluation of generalized weakness presented for weakness and inability to care for herself admitted with a diagnosis of UTI and NSTEMI cardiology on board  Patient Active Hospital Problem List:       NSTEMI (non-ST elevated myocardial infarction) (07/24/2022)           Assessment:   Sepsis likely from UTI  Presented with leukocytosis and tachycardia  CT scan of the chest showed small left pleural effusion and evidence of pulmonary hypertension  UA consistent with UTI  Blood culture and urine culture came negative  Initially started on empirical broad-spectrum antibiotic vancomycin and Zosyn  MRSA test is negative  Antibiotic de-escalated to ceftriaxone  Leukocytosis improved    NSTEMI  Elevated troponin  Likely demand ischemia  Troponin trended up from 90-108  Patient is on aspirin, statin and Pradaxa  Cardiology on board, recommended to get CTA  Follow-up recommendation    Anemia  Hemoglobin has been dropping from  From 12-10--8.2  I have ordered H&H monitor every 6 hours  Fecal occult blood test  Given patient has history of DVT I will continue the anticoagulation for now  Fecal occult blood test  Will monitor closely  No obvious bleeding noted    History of DVT  Patient had bilateral lower extremity DVT back in September 2023 currently on Pradaxa  Repeat venous ultrasound showed left  femoral popliteal DVT extending to the left peroneal vein and posterior tibialis vein with associated mild soft tissue swelling/edema and short segment nonocclusive thrombus within the right common femoral vein  I will involve hematology to guide on treatment    Chronic stable medical condition  Hypertension: Continue amlodipine/metoprolol/losartan    Type 2 diabetes  Carb controlled diet  Sliding scale  Hypoglycemia protocol    Morbid obesity  Outpatient follow-up    Bedbound  Wound on her left iliac area  Wound nurse consult        Nutrition: Heart healthy diet  Malnutrition Documentation    Moderate Malnutrition related to inadequate nutritional  intake in the setting of social/environmental circumstances as evidenced by <50%EER x 1 month, mild muscle losses (temporalis, pectorilism deltoid)         Patient has BMI=Body mass index is 41.57 kg/m.  Diagnosis: Obesity Class 3 (formerly known as Morbid Obesity) based on BMI criteria          Recent Labs     07/26/22  0256 07/24/22  0638 07/23/22  1823   Potassium 3.4* 3.4* 3.7     Diagnosis: Hypokalemia        Code Status: Full Code    Dispo: Pending    Family Contact: Updated her niece    DVT Prophylaxis:   Current Facility-Administered Medications (Includes Only Anticoagulants, Misc. Hematological)   Medication Dose  Route Last Admin    dabigatran (PRADAXA) capsule 150 mg  150 mg Oral 150 mg at 07/26/22 1106          CHART  REVIEW & DISCUSSION     The following chart items were reviewed as of 2:07 PM on 07/26/22:  '[]'$  Lab Results '[]'$  Imaging Results   '[]'$  Problem List  '[]'$  Current Orders '[]'$  Current Medications  '[]'$  Allergies  '[]'$  Code Status '[]'$  Previous Notes   '[]'$  SDoH    The management and plan of care for this patient was discussed with the following specialty consultants:  '[]'$  Cardiology  '[]'$  Gastroenterology                 '[]'$  Infectious Disease  '[]'$  Pulmonology '[]'$  Neurology                '[]'$  Nephrology  '[]'$  Neurosurgery '[]'$  Orthopedic Surgery  '[]'$  Heme/Onc  '[]'$  General  Surgery '[]'$  Psychiatry                                   '[]'$  Palliative    SUBJECTIVE     [name]  Patient reports that she is feeling weak  Alert oriented x 3  Denies chest pain, lightheadedness, nausea or vomiting  No bleeding reported or noted    MEDICATIONS     Current Facility-Administered Medications   Medication Dose Route Frequency    aspirin  81 mg Oral Daily    atorvastatin  40 mg Oral QHS    balsam peru-castor oil (VENELEX)   Topical Q12H    carvedilol  12.5 mg Oral Q12H SCH    cefTRIAXone  1 g Intravenous Q24H    dabigatran  150 mg Oral Q12H [place]    gabapentin  200 mg Oral Q8H [place]    insulin lispro  1-3 Units Subcutaneous QHS    insulin lispro  1-5 Units Subcutaneous TID AC    losartan (COZAAR) 100 mg, hydroCHLOROthiazide (HYDRODIURIL) 25 mg for HYZAAR   Oral Daily    miconazole 2 % with zinc oxide   Topical Q12H    senna-docusate  1 tablet Oral QHS       PHYSICAL EXAM     Vitals:    07/26/22 1106   BP: 128/72   Pulse: 69   Resp:    Temp:    SpO2: 98%       Temperature: Temp  Min: 97.5 F (36.4 C)  Max: 97.9 F (36.6 C)  Pulse: Pulse  Min: 69  Max: 83  Respiratory: Resp  Min: 14  Max: 20  Non-Invasive BP: BP  Min: 128/72  Max: 146/75  Pulse Oximetry SpO2  Min: 98 %  Max: 100 %    Intake and Output Summary (Last 24 hours) at Date Time    Intake/Output Summary (Last 24 hours) at 07/26/2022 1407  Last data filed at 07/26/2022 0600  Gross per 24 hour   Intake 1150 ml   Output 1550 ml   Net -400 ml       Chronically sick looking  GEN APPEARANCE: Normal;  A&OX3  HEENT: PERLA; EOMI; Conjunctiva Clear  NECK: Supple; No bruits  CVS: RRR, S1, S2; No M/G/R  LUNGS: CTAB; No Wheezes; No Rhonchi: No rales  ABD: Soft; No TTP; + Normoactive BS  EXT: Bilateral lower extremity edema  NEURO: Alert oriented x 3, weak all over  MENTAL STATUS:  Alert oriented x 3    LABS     Recent Labs   Lab 07/26/22  0256 07/24/22  0638 07/23/22  1648   WBC 8.75 11.91* 11.16*   RBC 2.59* 3.29* 3.90   Hgb 8.2* 10.2* 12.1   Hematocrit  26.4* 33.2* 38.5   MCV 101.9* 100.9* 98.7*   Platelets 464* 377* 461*       Recent Labs   Lab 07/26/22  0256 07/24/22  0638 07/23/22  1823   Sodium 142 143 143   Potassium 3.4* 3.4* 3.7   Chloride 111 109 108   CO2 '24 24 24   '$ BUN 10.0 13.0 12.0   Creatinine 0.7 0.7 0.6   Glucose 174* 175* 220*   Calcium 7.9 8.6 8.5   Magnesium 1.0*  --   --        Recent Labs   Lab 07/24/22  0638 07/23/22  1823   ALT 7 11   AST (SGOT) 12 16   Bilirubin, Total 0.8 0.8   Albumin 1.7* 1.9*   Alkaline Phosphatase 69 76       Recent Labs   Lab 07/24/22  0638 07/24/22  0026 07/23/22  2218   Creatine Kinase (CK)  --   --  43   hs Troponin-I 103.4* 108.5*  --    hs Troponin-I Delta  --  calc n/a  --        Recent Labs   Lab 07/24/22  0117 07/23/22  2218   PT INR 1.6* 1.5*   PT 19.0* 17.3*   PTT 32 24*       Microbiology Results (last 15 days)       Procedure Component Value Units Date/Time    MRSA culture [id number] Collected: 07/24/22 1932    Order Status: Completed Specimen: Culturette from Nasal Swab Updated: 07/26/22 0214     Culture MRSA Surveillance Negative for Methicillin Resistant Staph aureus    MRSA culture [id number] Collected: 07/24/22 1932    Order Status: Completed Specimen: Culturette from Throat Updated: 07/26/22 0214     Culture MRSA Surveillance Negative for Methicillin Resistant Staph aureus    Urine culture [id number] Collected: 07/24/22 1932    Order Status: Completed Specimen: Bladder Updated: 07/26/22 0413    Narrative:      ORDER#: [id number]                                    ORDERED BY: Enis Gash  SOURCE: Urine                                        COLLECTED:  07/24/22 19:32  ANTIBIOTICS AT COLL.:                                RECEIVED :  07/24/22 20:10  Culture Urine                              FINAL       07/26/22 04:13  07/26/22   No growth of >1,000 CFU/ML, No further work      Culture Blood Aerobic and Anaerobic [id number] Collected: 07/23/22 2218    Order Status: Completed Specimen: Blood,  Venipuncture Updated:  07/26/22 0221    Narrative:      The order will result in two separate 8-77m bottles  Please do NOT order repeat blood cultures if one has been  drawn within the last 48 hours  UNLESS concerned for  endocarditis  AVOID BLOOD CULTURE DRAWS FROM CENTRAL LINE IF POSSIBLE  Indications:->Sepsis  ORDER#: [id number]                                   ORDERED BY: BEnis Gash SOURCE: Blood, Venipuncture                          COLLECTED:  07/23/22 22:18  ANTIBIOTICS AT COLL.:                                RECEIVED :  07/24/22 01:42  Culture Blood Aerobic and Anaerobic        PRELIM      07/26/22 02:21  07/25/22   No Growth after 1 day/s of incubation.  07/26/22   No Growth after 2 day/s of incubation.      Culture Blood Aerobic and Anaerobic [[id number]: 07/23/22 2218    Order Status: Completed Specimen: Blood, Venipuncture Updated: 07/26/22 0221    Narrative:      The order will result in two separate 8-141mbottles  Please do NOT order repeat blood cultures if one has been  drawn within the last 48 hours  UNLESS concerned for  endocarditis  AVOID BLOOD CULTURE DRAWS FROM CENTRAL LINE IF POSSIBLE  Indications:->Sepsis  ORDER#: [id number]                                  ORDERED BY: BREnis GashSOURCE: Blood, Venipuncture                          COLLECTED:  07/23/22 22:18  ANTIBIOTICS AT COLL.:                                RECEIVED :  07/24/22 01:42  Culture Blood Aerobic and Anaerobic        PRELIM      07/26/22 02:21  07/25/22   No Growth after 1 day/s of incubation.  07/26/22   No Growth after 2 day/s of incubation.      COVID-19 (SARS-CoV-2) and Influenza A/B, NAA (Liat Rapid) [[id number]: 07/23/22 2218    Order Status: Completed Specimen: Culturette from Nasopharyngeal Updated: 07/23/22 2259     Purpose of COVID testing Diagnostic -[facility]     SARS-CoV-2 Specimen Source Nasal Swab     SARS CoV 2 Overall Result Not Detected     Comment:  __________________________________________________  -A result of "Detected" indicates POSITIVE for the    presence of SARS CoV-2 RNA  -A result of "Not Detected" indicates NEGATIVE for the    presence of SARS CoV-2 RNA  __________________________________________________________  Test performed using the Roche cobas Liat SARS-CoV-2 assay. This assay is  only for use under the Food and Drug Administration's Emergency Use  Authorization. This is a real-time RT-PCR assay for the qualitative  detection of SARS-CoV-2 RNA. Viral  nucleic acids may persist in vivo,  independent of viability. Detection of viral nucleic acid does not imply the  presence of infectious virus, or that virus nucleic acid is the cause of  clinical symptoms. Negative results do not preclude SARS-CoV-2 infection and  should not be used as the sole basis for diagnosis, treatment or other  patient management decisions. Negative results must be combined with  clinical observations, patient history, and/or epidemiological information.  Invalid results may be due to inhibiting substances in the specimen and  recollection should occur. Please see Fact Sheets for patients and providers  located:  https://www.benson-chung.com/          Influenza A Not Detected     Influenza B Not Detected     Comment: Test performed using the Roche cobas Liat SARS-CoV-2 & Influenza A/B assay.  This assay is only for use under the Food and Drug Administration's  Emergency Use Authorization. This is a multiplex real-time RT-PCR assay  intended for the simultaneous in vitro qualitative detection and  differentiation of SARS-CoV-2, influenza A, and influenza B virus RNA. Viral  nucleic acids may persist in vivo, independent of viability. Detection of  viral nucleic acid does not imply the presence of infectious virus, or that  virus nucleic acid is the cause of clinical symptoms. Negative results do  not preclude SARS-CoV-2, influenza A, and/or influenza B  infection and  should not be used as the sole basis for diagnosis, treatment or other  patient management decisions. Negative results must be combined with  clinical observations, patient history, and/or epidemiological information.  Invalid results may be due to inhibiting substances in the specimen and  recollection should occur. Please see Fact Sheets for patients and providers  located: http://olson-hall.info/.         Narrative:      o Collect and clearly label specimen type:  o PREFERRED-Upper respiratory specimen: One Nasal Swab in  Transport Media.  o Hand deliver to laboratory ASAP  Diagnostic -[facility]             RADIOLOGY     XR Hip left 2-3 vw with pelvis    Result Date: 07/25/2022    Mild to moderate bilateral hip degenerative changes. Moderate rectal stool burden. Tama Headings, MD 07/25/2022 12:43 PM    US Venous Low Extrem Duplex Palmer Bilat    Result Date: 07/24/2022   Left femoral-popliteal DVT extending to the left peroneal veins and posterior tibial veins, with associated mild soft tissue swelling/edema. Short segment nonocclusive thrombus within the right common femoral vein. Oley Balm, MD 07/24/2022 5:18 PM    CT Abd/Pelvis without Contrast    Result Date: 07/24/2022   1. Nonobstructing left renal stones. 2. Small bladder stones. 3. Additional incidental findings as described above. Edison Simon, MD 07/24/2022 1:43 AM    CT Chest without Contrast    Result Date: 07/24/2022  Small left pleural effusion, with minimal bibasilar areas of atelectasis. No focal consolidation. Slightly patulous and fluid-filled esophagus can be seen in the setting of gastroesophageal reflux. 2.6 x 2.0 x 3.5 cm low-attenuation lesion within the left thyroid lobe. Correlation with ultrasound is recommended for follow-up. Enlarged main pulmonary trunk, suggestive of underlying pulmonary arterial hypertension. Status post cholecystectomy. Nonobstructing left renal calculus. Timmie Foerster MD, MD 07/24/2022 1:40 AM    X-ray chest AP portable    Result Date: 07/23/2022   1. Small left pleural effusion. Dense left basilar airspace opacity suspicious for pneumonia.  2. Air-filled structure projecting over the lower mediastinum suggestive of hiatal hernia. Air also noted distending the esophagus. If indicated, further assessment can be made with CT. Denice Paradise, MD 07/23/2022 10:42 PM   Echo Results       Procedure Component Value Units Date/Time    Echo Adult TTE Complete [id number] Resulted: 07/26/22 1305     Updated: 07/26/22 1305          No results found for this or any previous visit.    Signed,  Arley Phenix, MD  2:07 PM 07/26/2022

## 2022-07-26 NOTE — Progress Notes (Addendum)
[facility] CAPACITY EVALUATION     Patient Name: Meredith Baker Current Date/Time: 07/26/22  MRN: [id number] Primary Attending Physician: Arley Phenix, MD  DOB: 03-08-1946   Gender: female    I, Dr. Arley Phenix, MD am treating Meredith Baker.      Brief history on what has been going on with the patient that requires capacity evaluation.    Upon evaluation:    1. Does the patient understand the problem? Yes  2. Does the patient understand the proposed treatment?  Yes  3. Does the patient understand the alternatives to proposed treatment (if any)?  Yes  4. Does the patient understand the option of refusing treatment?   Yes  5. Does the patient appreciate the reasonably foreseeable consequences of ACCEPTING treatment? Yes  6. Does the patient appreciate the reasonably foreseeable consequences of REFUSING proposed treatment? Yes  7.Does the patient have the ability to make a decision that is NOT substantially based on hallucinations, delusions, or cognitive signs of depression? Yes         Based on the above evaluations, the patient does have sufficient  capacity

## 2022-07-26 NOTE — UM Notes (Signed)
PATIENT NAME: Meredith Baker, Meredith Baker   DOB: [birth year]     Continued Stay Review: 11.28.2023 Inpatient status    76 years old female with history of hypertension, type 2 diabetes, hyperlipidemia, osteoarthritis, bedbound, history of bilateral DVT who presented for evaluation of generalized weakness presented for weakness and inability to care for herself admitted with a diagnosis of UTI and NSTEMI cardiology on board.    Patient reports that she is feeling weak  Alert oriented x 3    Vital Signs  07/26/22 11:06:16 97.5 69 14 128/72 98 %       Medications  Scheduled Meds:  Current Facility-Administered Medications   Medication Dose Route Frequency    aspirin  81 mg Oral Daily    atorvastatin  40 mg Oral QHS    balsam peru-castor oil (VENELEX)   Topical Q12H    carvedilol  12.5 mg Oral Q12H SCH    cefTRIAXone  1 g Intravenous Q24H    dabigatran  150 mg Oral Q12H [place]    gabapentin  200 mg Oral Q8H [place]    insulin lispro  1-3 Units Subcutaneous QHS    insulin lispro  1-5 Units Subcutaneous TID AC    losartan (COZAAR) 100 mg, hydroCHLOROthiazide (HYDRODIURIL) 25 mg for HYZAAR   Oral Daily    miconazole 2 % with zinc oxide   Topical Q12H    senna-docusate  1 tablet Oral QHS     0.9% NaCl infusion  Rate: 100 mL/hr Freq: Continuous Route: IV  Start: 07/26/22 0430 End: 07/26/22 1444      Abnormal Labs   07/26/22 02:56   Hemoglobin 8.2 (L)   Hematocrit 26.4 (L)   Platelet Count 464 (H)   RBC 2.59 (L)   MCV 101.9 (H)   MCHC 31.1 (L)   RDW 20 (H)      07/26/22 02:56   Glucose 174 (H)   Potassium 3.4 (L)   Magnesium 1.0 (L)   Lactic Acid 2.2 (H)      07/26/22 02:56   HDL 26 (L)      07/26/22 02:56   NT-proBNP 4,800 (H)      07/26/22 02:56   Urine Type Urine, Clean Ca   Color, UA Straw   Clarity, UA Hazy   Specific Gravity, UA 1.023   Urine pH 6.0   Leukocyte Esterase, UA Trace !   Nitrite, UA Negative   Protein, UR Negative   Glucose, UA Negative   Ketones UA Negative   Urobilinogen, UA Normal   Bilirubin, UA Negative   Blood, UA Small  !   RBC UA 6-10 !   WBC, UA 0-5   Squamous Epithelial Cells, Urine 0-5   Urine Mucus Present      07/26/22 02:56   Hemoglobin A1C 6.0 (H)       Imaging Yesterday L Hip XR:  Mild to moderate bilateral hip degenerative changes.  Moderate rectal stool burden.    Today: ECHO ordered    Plan of Care   NSTEMI (non-ST elevated myocardial infarction) (07/24/2022)           Assessment:   Sepsis likely from UTI  Presented with leukocytosis and tachycardia  CT scan of the chest showed small left pleural effusion and evidence of pulmonary hypertension  UA consistent with UTI  Blood culture and urine culture came negative  Initially started on empirical broad-spectrum antibiotic vancomycin and Zosyn  MRSA test is negative  Antibiotic de-escalated to ceftriaxone  Leukocytosis improved  NSTEMI  Elevated troponin  Likely demand ischemia  Troponin trended up from 90-108  Patient is on aspirin, statin and Pradaxa  Cardiology on board, recommended to get CTA  Follow-up recommendation     Anemia  Hemoglobin has been dropping from  From 12-10--8.2  I have ordered H&H monitor every 6 hours  Fecal occult blood test  Given patient has history of DVT I will continue the anticoagulation for now  Fecal occult blood test  Will monitor closely  No obvious bleeding noted     History of DVT  Patient had bilateral lower extremity DVT back in September 2023 currently on Pradaxa  Repeat venous ultrasound showed left femoral popliteal DVT extending to the left peroneal vein and posterior tibialis vein with associated mild soft tissue swelling/edema and short segment nonocclusive thrombus within the right common femoral vein  I will involve hematology to guide on treatment     Chronic stable medical condition  Hypertension: Continue amlodipine/metoprolol/losartan     Type 2 diabetes  Carb controlled diet  Sliding scale  Hypoglycemia protocol     Morbid obesity  Outpatient follow-up     Bedbound  Wound on her left iliac area  Wound nurse consult      Nutrition: Heart healthy diet    Dispo: Pending    Primary Coverage:  Payor: MEDICARE [employer] / Plan: [employer] [facility] [facility] / Product Type: MANAGED MEDICARE /     UTILIZATION REVIEW CONTACT: Laurice Record, RN  Utilization Review   [place]  [phone]  [phone]  Email: [email or web address]  Tax IDKU:[id number]         NOTES TO REVIEWER:    This clinical review is based on/compiled from documentation provided by the treatment team within the patient's medical record.

## 2022-07-26 NOTE — OT Eval Note (Signed)
Occupational Therapy Eval and Treatment [name]        Post Acute Care Therapy Recommendations   Discharge Recommendations:  SNF    DME needs IF patient is discharging home: Patient already has needed equipment    Therapy discharge recommendations may change with patient status.  Please refer to most recent note for up-to-date recommendations.    Unit: [address]  Bed: A2502/A2502-B      ___________________________________________________    Time of Evaluation and Treatment:  Time Calculation  OT Received On: 07/26/22  Start Time: 1640  Stop Time: 1720  Time Calculation (min): 40 min       Chart Review and Collaboration with Care Team: 5 minutes, not included in above time.    Evaluation: 10 minutes  Treatment:  30 minutes    OT Visit Number: 1    Consult received for [name] for OT Evaluation and Treatment.  Patient's medical condition is appropriate for Occupational therapy intervention at this time.    Activity Orders:  OT eval and treat    Precautions and Contraindications:  Precautions  Weight Bearing Status: no restrictions  Fall Risks: High, Impaired balance/gait, Impaired mobility  Other Precautions: Bleeding    Personal Protective Equipment (PPE)  gloves and procedure mask    Medical Diagnosis:  Failure to thrive in adult [R62.7]  NSTEMI (non-ST elevated myocardial infarction) [I21.4]  Urinary incontinence in female [R32]  Pressure injury of skin of other site, unspecified injury stage [L89.899]    History of Present Illness:  Odis Rusnak is a 76 y.o. female admitted on 07/23/2022 with  PMHx of morbid obesity, type II DM, hypertension, lower extremity DVT on Pradaxa, bedbound, urinary incontinence and large superficial left iliac pressure wound brought to the ED for evaluation of generalized weakness.  Patient reports that she lives with her niece and her 39 years old uncle who has been taking care of her for the past 4 -5 years. but lately he has become increasingly weak  and unable to care for her.  She reports that she has not eaten anything for the last 3 days.  Patient is requesting to be placed in a nursing home.       Patient Active Problem List   Diagnosis    NSTEMI (non-ST elevated myocardial infarction)        Past Medical/Surgical History:  Past Medical History:   Diagnosis Date    Diabetes mellitus     History of blood clots     Hypertension      Past Surgical History:   Procedure Laterality Date    TUBAL LIGATION          X-Rays/Tests/Labs  Lab Results   Component Value Date/Time    HGB 8.8 (L) 07/26/2022 04:00 PM    HCT 28.5 (L) 07/26/2022 04:00 PM    K 3.4 (L) 07/26/2022 02:56 AM    NA 142 07/26/2022 02:56 AM    INR 1.6 (H) 07/24/2022 01:17 AM    TROPI 103.4 (AA) 07/24/2022 06:38 AM    TROPI 108.5 (AA) 07/24/2022 12:26 AM    TROPI calc n/a 07/24/2022 12:26 AM    TROPI 92.0 (AA) 07/23/2022 04:48 PM       All imaging reviewed. Please see chart for details      Social History:  Prior Level of Function  Prior level of function: Needs assistance with ADLs, Bedbound / Total Care  Baseline Activity Level: No independent activity  Ambulated 100 feet or  more prior to admission: No  Driving: does not drive  Dressing - Upper Body: dependent  Dressing - Lower Body: dependent  Cooking: dependent  Feeding: independent  Bathing: dependent  Grooming: independent (bed level)  Toileting: dependent  Employment: Retired  DME Currently at [employer]: Wheelchair, [place]: Family members  Type of Home: [place]: Two level, Able to live on main level with bedroom/bathroom, Stairs to enter with rails (add number in comment) (4 STE, basement level only)  Bathroom Shower/Tub: Tub/shower unit (changing to walk in shower)  Bathroom Toilet: Standard  DME Currently at Home: Wheelchair, [place] - Notes / Comments: pt lives w/ 58 y/o friend/family member. reports he is unable to assist pt. pt has increasingly become more weak. initially  able to transfer supine>sit EOB. now spends all time in bed. reports needing total assist with all ADLs except grooming and feeding      Subjective:  Subjective: "I will try!"   Patient is agreeable to participation in the therapy session. Nursing clears patient for therapy.   Patient Goal: "sit up"  Pain Assessment  Pain Assessment: No/denies pain      Objective:      Observation of Patient/Vital Signs:BP 131/74   Pulse 79   Temp 97.7 F (36.5 C) (Oral)   Resp 16   Ht 1.575 m ('5\' 2"'$ )   Wt 103.1 kg (227 lb 4.8 oz)   SpO2 97%   BMI 41.57 kg/m       Cognitive Status and Neuro Exam:  Cognition/Neuro Status  Arousal/Alertness: Appropriate responses to stimuli  Attention Span: Appears intact  Orientation Level: Oriented X4    Musculoskeletal Examination  Gross ROM  Right Upper Extremity ROM: within functional limits  Left Upper Extremity ROM: within functional limits  Gross Strength  Right Upper Extremity Strength: within functional limits  Left Upper Extremity Strength: within functional limits       Sensory/Oculomotor Examination  Sensory  Auditory: intact  Tactile - Light Touch: intact         Activities of Daily Living  Self-care and Home Management  LB Dressing: Dependent  Toileting: Dependent    Functional Mobility:  Mobility and Transfers  Rolling: Moderate Assist (using bedrails)  Scooting to HOB: Dependent (x2)  Scooting to EOB: Increased Effort, Increased Time, using bedrail, Maximal Assist  Supine to Sit: Dependent, using bedrail, HOB raised, Increased Effort, Increased Time  Sit to Supine: Dependent  Sit to Stand: Unable to assess (Comment) (unsafe)  Functional Mobility/Ambulation: Unable to assess (Comment) (unsafe)     Balance  Balance  Static Sitting Balance: Moderate Assist    Participation and Activity Tolerance  Participation and Endurance  Participation Effort: good  Endurance: Tolerates < 10 min exercise, no significant change in vital signs    Educated the Patient to role of occupational  therapy, plan of care, goals of therapy and safety with mobility and ADLs, home safety with verbalized understanding .    Patient left in bed with alarm and all other medical equipment in place and call bell and all personal items/needs within reach.  RN notified of session outcome. Notified CM team and attending of d/c recommendation via secure chat.      Assessment:  Carmelle Mueth is a 76 y.o. female admitted 07/23/2022. OT Assessment: decreased strength;balance deficits;decreased cognition;decreased independence with IADLs;decreased endurance/activity tolerance;decreased independence with ADLs      Treatment:  DEP to transition supine <>  EOB with increased time and HOB elevated. Pt able to sit unsupported EOB c Mod A progressing to CGA/Min A. In supine, pt c soiled bedsheets. Mod/Max A to roll using bed rails. DEP for peri cleaning and hygiene. DEP x2 to scoot patient to [facility]. INC time required throughout session d/t patient's body habitus.    Rehabilitation Potential: Prognosis: Good    Plan:  OT Frequency Recommended: 2-3x/wk   Treatment Interventions: ADL retraining, Functional transfer training, Endurance training     PMP Activity: Step 4 - Dangle at Bedside  Distance Walked (ft) (Step 6,7): 0 Feet    Risks/benefits/POC discussed    Goals:  Time For Goal Achievement: by time of discharge  ADL Goals  Patient will feed self: Independent  Patient will groom self: Independent  Mobility and Transfer Goals  Other Goal: Patient will transitioned supine <> EOB c Min A     OT and PT disagree with discharge disposition. Opposite discipline made aware.                     Megan Mans, OTR/L [id number]  07/26/2022 5:29 PM    [facility]  Patient: Edythe Denzler MRN#: [id number]   Unit: [address] Bed: A2502/A2502-B

## 2022-07-26 NOTE — Plan of Care (Signed)
 NURSING SHIFT NOTE     Patient: Meredith Baker  Day: 2      SHIFT EVENTS     Shift Narrative/Significant Events (PRN med administration, fall, RRT, etc.):   Patient had an uneventful night. Hep Drip started per order. No signs of GI bleed noted. H&H 8.3 & 26.5  All other meds administered as ordered.  Safety and fall precautions remain in place. Purposeful rounding completed.              CARE PLAN        Problem: Compromised Tissue integrity  Goal: Damaged tissue is healing and protected  Outcome: Progressing  Flowsheets (Taken 07/26/2022 2107)  Damaged tissue is healing and protected:   Monitor/assess Braden scale every shift   Provide wound care per wound care algorithm     Problem: Safety  Goal: Patient will be free from injury during hospitalization  Outcome: Progressing  Flowsheets (Taken 07/26/2022 2107)  Patient will be free from injury during hospitalization:   Assess patient's risk for falls and implement fall prevention plan of care per policy   Use appropriate transfer methods   Ensure appropriate safety devices are available at the bedside   Include patient/ family/ care giver in decisions related to safety   Hourly rounding   Provide and maintain safe environment     Problem: Hemodynamic Status: Cardiac  Goal: Stable vital signs and fluid balance  Outcome: Progressing  Flowsheets (Taken 07/26/2022 2107)  Stable vital signs and fluid balance:   Assess signs and symptoms associated with cardiac rhythm changes   Monitor lab values

## 2022-07-26 NOTE — OT Eval Note (Addendum)
[facility]   Occupational Therapy Attempt Note    Patient:  Meredith Baker MRN#:  [id number]  Unit:  [place] Room/Bed:  A2502/A2502-B      Occupational Therapy evaluation attempted on 07/26/2022 at 12:31 PM.    OT Visit Cancellation Reason: Testing/Procedure (Pt off floor for ECHO)          Occupational therapy will follow up at a later time/date as able.    [name], OTR/L    Physical Medicine and Rehabilitation  [facility]  [phone]    07/26/2022  12:31 PM      [facility]   Occupational Therapy Attempt Note    Patient:  Meredith Baker MRN#:  [id number]  Unit:  [address] Room/Bed:  A2502/A2502-B      Occupational Therapy evaluation attempted on 07/26/2022 at 2:50 PM.    OT Visit Cancellation Reason: Other (comment required) (Pt eating)          Occupational therapy will follow up at a later time/date as able.    RN aware.     [name], OTR/L    Physical Medicine and [facility]  [phone]    07/26/2022  2:50 PM

## 2022-07-26 NOTE — Progress Notes (Signed)
Given patient is having active DVT and drop in H&H I have consulted hematology for guidance of treatment, discussed with Dr. Cheryl Flash, recommended to stop Pradaxa and and start heparin drip which helps to monitor for bleeding, heparin drip to be started at 11 PM since patient already received Pradaxa at 11 AM today.

## 2022-07-26 NOTE — Progress Notes (Shared)
CARDIOLOGY PROGRESS NOTE    Date Time: 07/26/22 4:15 PM  Patient Name: Meredith Baker, Meredith Baker      Assessment:     Mildly elevated HS troponin I level; likely demand ischemia; chest pain-free  Abnormal EKG; right bundle branch block and left anterior fascicular block  Hypertension, hypertensive cardiovascular disease  Diabetes mellitus, A1c 6.0%  Hx of DVT/PE  Arthritis and being bed bound  Left lower extremity laterally rotated; Xray reports bilateral hip degenerative changes     Plan:   On Pradaxa 150 mg p.o. twice a day, for hx of PE/DVT  Aspirin 81 mg p.o. once a day  Atorvastatin 40 mg p.o. once a day  Carvedilol 12.5 mg p.o. twice a day  Losartan 100 mg p.o. once a day  Hydrochlorothiazide 25 mg p.o. once a day  CBC and BMP level  Work-up for CAD once medically stable  Discussed with patient about her living situation  Out of bed, sit in chair, and ambulate with PT/OT    Subjective/chief complaint:   Patient is seen and examined  All medications and labs reviewed    Patient states that feeling improved today, and ate some of her lunch.         Medications:     Current Facility-Administered Medications   Medication Dose Route Frequency    aspirin  81 mg Oral Daily    atorvastatin  40 mg Oral QHS    balsam peru-castor oil (VENELEX)   Topical Q12H    carvedilol  12.5 mg Oral Q12H SCH    cefTRIAXone  1 g Intravenous Q24H    [Held by provider] dabigatran  150 mg Oral Q12H SCH    gabapentin  200 mg Oral Q8H Roland    insulin lispro  1-3 Units Subcutaneous QHS    insulin lispro  1-5 Units Subcutaneous TID AC    losartan (COZAAR) 100 mg, hydroCHLOROthiazide (HYDRODIURIL) 25 mg for HYZAAR   Oral Daily    miconazole 2 % with zinc oxide   Topical Q12H    senna-docusate  1 tablet Oral QHS       Review of Systems:   General ROS: no weight loss, no weight gain, no fever, no chills  Hematological and Lymphatic ROS: negative  Endocrine ROS: no fatigue, no polyuria, no polydipsia, no general weakness  Respiratory ROS: no shortness of  breath, no cough, no wheezes and no hemoptysis  Cardiovascular ROS: no chest pain, no palpitations, no PND and no orthopnea, no DOE  Gastrointestinal ROS: no nausea, no vomiting, no diarrhea, no constipation, no blood in stool and no abdominal pain  Musculoskeletal ROS: no muscle pain, no muscle weakness, no joint pain, no swelling and no redness  Neurological ROS: no headache, no dizziness, no diplopia, no focal weakness, no seizure  Dermatological ROS: no rash, no itching and no ecchymoses, no pressure ulcer      Physical Exam:   BP 128/72   Pulse 77   Temp 97.5 F (36.4 C) (Oral)   Resp 14   Ht 1.575 m ('5\' 2"'$ )   Wt 103.1 kg (227 lb 4.8 oz)   SpO2 98%   BMI 41.57 kg/m       Intake/Output Summary (Last 24 hours) at 07/26/2022 1615  Last data filed at 07/26/2022 0600  Gross per 24 hour   Intake 1150 ml   Output 1550 ml   Net -400 ml         General appearance - alert, ill appearing, and in no  distress  Mental status - alert, oriented to person, place, and time  HEENT:normocephalic, no icterus, no pallor, no cyanosis  Neck: supple, no JVD, no thyromegaly, no carotid bruits  Chest - clear to auscultation, no wheezes, rales or rhonchi, symmetric air entry  Heart - normal rate, regular rhythm, normal S1, S2, no S3 ,+murmurs, rubs, clicks or gallops  Abdomen - soft, nontender, nondistended, no masses or organomegaly  Neurological - alert, oriented, normal speech, no focal findings  noted  Musculoskeletal - + joint tenderness, +deformity   Extremities - peripheral pulses palpable,trace pedal edema, no clubbing or cyanosis  Skin - normal color , no rashes.    Labs:     CBC w/Diff   Recent Labs   Lab 07/26/22  0256 07/24/22  0638 07/23/22  1648   WBC 8.75 11.91* 11.16*   Hgb 8.2* 10.2* 12.1   Hematocrit 26.4* 33.2* 38.5   Platelets 464* 377* 461*            Basic Metabolic Profile   Recent Labs   Lab 07/26/22  0256 07/24/22  0638 07/23/22  1823   Sodium 142 143 143   Potassium 3.4* 3.4* 3.7   Chloride 111 109 108    CO2 '24 24 24   '$ BUN 10.0 13.0 12.0   Creatinine 0.7 0.7 0.6   Calcium 7.9 8.6 8.5   ALT  --  7 11   AST (SGOT)  --  12 16   Glucose 174* 175* 220*            Cardiac Enzymes   Recent Labs   Lab 07/24/22  0638 07/24/22  0026 07/23/22  2218 07/23/22  1648   Creatine Kinase (CK)  --   --  43  --    hs Troponin-I 103.4* 108.5*  --  92.0*   hs Troponin-I Delta  --  calc n/a  --   --          Lab Results   Component Value Date    BNP 4,800 (H) 07/26/2022          Thyroid Studies    Recent Labs   Lab 07/25/22  0402   TSH 0.33*             Lipid Profile   Recent Labs   Lab 07/26/22  0256   Cholesterol 92          Radiology Results (24 Hour)       ** No results found for the last 24 hours. Vonzell Schlatter, MD  [facility] Marte S Vlasta Baskin, PA, PA-C  07/26/2022 4:15 PM

## 2022-07-26 NOTE — Consults (Incomplete)
Initial Case Management Assessment and Discharge [place]   Patient Name: Meredith Baker, Meredith Baker   Date of Birth Nov 30, 1945   Attending Physician: Arley Phenix, MD   Primary Care Physician: Pcp, None, MD   Length of Stay 2   Reason for Consult / Chief Complaint ***        Situation   Admission DX:   1. NSTEMI (non-ST elevated myocardial infarction)    2. Pressure injury of skin of other site, unspecified injury stage    3. Failure to thrive in adult    4. Urinary incontinence in female        A/O Status: {ORIENTATION:21833}    LACE Score: ***    Patient admitted from: {DESC; ADMISSION UT:5211797  Admission Status: {ADMISSION STATUS:23120}    Health Care Agent: {SW HEALTHCARE AGENT:24426::"Self"}  Name: ***  Phone number: ***       Background     Advanced directive:   <no information>    {VP RESPONSES, ADVANCED DIRECTIVES:36974}    Code Status:   Full Code     Residence: {Type of residence:21024023}    PCP: PCP None, MD  Patient Contact:   [phone] (home)     [phone] (mobile)     Emergency contact:   Extended Emergency Contact Information  Primary Emergency Contact: willis,Shantelle  Mobile Phone: [phone]  Relation: Other  Preferred language: English      ADL/IADL's: {ADL_IADL:27581}  Previous Level of function: {Functional Levels:32028}    DME: {Home HS:1928302    Pharmacy:     [place] - [address]  [place] [address]  Phone: [phone] Fax: [phone]      Prescription Coverage: {YES (DEF)/NO:32935}    Home Health: {home health services:20159}    Previous SNF/AR: ***    COVID Vaccine Status: ***    Date First IMM given: ***  UAI on file?: {YES (DEF)/NO:32935}  Transport for discharge? {Mode of transportation:40371:::1}  Agreeable to {DISPOSITIONFX:33248} post-discharge:  {YES (DEF)/NO:32935}     Assessment     BARRIERS TO DISCHARGE: ***     Recommendation   D/C Plan A: {DISPOSITION:30674}    D/C Plan B:  {DISPOSITION:30674}    D/C Plan C: {DISPOSITION:30674}

## 2022-07-27 LAB — COMPREHENSIVE METABOLIC PANEL
ALT: 6 U/L (ref 0–55)
AST (SGOT): 10 U/L (ref 5–41)
Albumin/Globulin Ratio: 0.6 — ABNORMAL LOW (ref 0.9–2.2)
Albumin: 1.5 g/dL — ABNORMAL LOW (ref 3.5–5.0)
Alkaline Phosphatase: 53 U/L (ref 37–117)
Anion Gap: 8 (ref 5.0–15.0)
BUN: 10 mg/dL (ref 7.0–21.0)
Bilirubin, Total: 0.3 mg/dL (ref 0.2–1.2)
CO2: 21 mEq/L (ref 17–29)
Calcium: 8.3 mg/dL (ref 7.9–10.2)
Chloride: 109 mEq/L (ref 99–111)
Creatinine: 0.8 mg/dL (ref 0.4–1.0)
Globulin: 2.6 g/dL (ref 2.0–3.6)
Glucose: 243 mg/dL — ABNORMAL HIGH (ref 70–100)
Potassium: 4.1 mEq/L (ref 3.5–5.3)
Protein, Total: 4.1 g/dL — ABNORMAL LOW (ref 6.0–8.3)
Sodium: 138 mEq/L (ref 135–145)
eGFR: >60 mL/min/{1.73_m2} (ref >=60)

## 2022-07-27 LAB — ANTI-XA,UFH
Anti-Xa, UFH: 1.75 IU/mL
Anti-Xa, UFH: 1.17 IU/mL

## 2022-07-27 LAB — GLUCOSE WHOLE BLOOD - POCT
Whole Blood Glucose POCT: 216 mg/dL — ABNORMAL HIGH (ref 70–100)
Whole Blood Glucose POCT: 216 mg/dL — ABNORMAL HIGH (ref 70–100)
Whole Blood Glucose POCT: 239 mg/dL — ABNORMAL HIGH (ref 70–100)
Whole Blood Glucose POCT: 224 mg/dL — ABNORMAL HIGH (ref 70–100)

## 2022-07-27 LAB — STOOL OCCULT BLOOD: Stool Occult Blood: POSITIVE — AB

## 2022-07-27 LAB — HEMOGLOBIN AND HEMATOCRIT, BLOOD
Hematocrit: 26.5 % — ABNORMAL LOW (ref 34.7–43.7)
Hgb: 8.3 g/dL — ABNORMAL LOW (ref 11.4–14.8)

## 2022-07-27 LAB — LACTIC ACID, PLASMA: Lactic Acid: 1.6 mmol/L (ref 0.2–2.0)

## 2022-07-27 LAB — MAGNESIUM: Magnesium: 1.8 mg/dL (ref 1.6–2.6)

## 2022-07-27 LAB — HEMOGLOBIN AND HEMATOCRIT
Hematocrit: 28.4 % — ABNORMAL LOW (ref 34.7–43.7)
Hgb: 8.8 g/dL — ABNORMAL LOW (ref 11.4–14.8)

## 2022-07-27 LAB — PHOSPHORUS: Phosphorus: 2.4 mg/dL (ref 2.3–4.7)

## 2022-07-27 MED ORDER — DABIGATRAN ETEXILATE MESYLATE 150 MG PO CAPS
150.0000 mg | ORAL_CAPSULE | Freq: Two times a day (BID) | ORAL | Status: DC
Start: 2022-07-27 — End: 2022-08-15
  Administered 2022-07-27 – 2022-08-15 (×38): 150 mg via ORAL
  Filled 2022-07-27 (×42): qty 1

## 2022-07-27 NOTE — Plan of Care (Incomplete)
NURSING SHIFT NOTE     Patient: Meredith Baker  Day: 3      SHIFT EVENTS     Shift Narrative/Significant Events (PRN med administration, fall, RRT, etc.):     Patient is generally stable. One BM this shift; No evidence active bleeding in stool.  Positive for stool occult test this shift. Hgb 8.4  this morning.  Other morning labs attempted; Day shift staff to follow up with iv team.  Blood sugar monitored and covered accordingly. Wound care provided.  Safety and fall precautions remain in place. Purposeful rounding completed.            CARE PLAN        Problem: Compromised Tissue integrity  Goal: Damaged tissue is healing and protected  Outcome: Progressing  Flowsheets (Taken 07/27/2022 2059)  Damaged tissue is healing and protected:   Monitor/assess Braden scale every shift   Provide wound care per wound care algorithm     Problem: Safety  Goal: Patient will be free from injury during hospitalization  Outcome: Progressing  Flowsheets (Taken 07/27/2022 2059)  Patient will be free from injury during hospitalization:   Assess patient's risk for falls and implement fall prevention plan of care per policy   Provide and maintain safe environment   Hourly rounding     Problem: Hemodynamic Status: Cardiac  Goal: Stable vital signs and fluid balance  Outcome: Progressing  Flowsheets (Taken 07/26/2022 2107)  Stable vital signs and fluid balance:   Assess signs and symptoms associated with cardiac rhythm changes   Monitor lab values

## 2022-07-27 NOTE — Progress Notes (Signed)
[facility]  PROGRESS NOTE      Patient: Meredith Baker  Date: 07/27/2022   LOS: 3 Days  Admission Date: 07/23/2022   MRN: [id number]  Attending: Arley Phenix, MD  When on service as the attending, please contact me on [place] from 7 AM - 7 PM for non-urgent issues. For urgent matters use [employer] page from 7 AM - 7 PM.       ASSESSMENT/PLAN     Meredith Baker is a 76 y.o. female admitted with NSTEMI (non-ST elevated myocardial infarction)    Interval Summary:   76 years old female with history of hypertension, type 2 diabetes, hyperlipidemia, osteoarthritis, bedbound, history of bilateral DVT who presented for evaluation of generalized weakness presented for weakness and inability to care for herself admitted with a diagnosis of UTI and NSTEMI cardiology on board  Patient Active Hospital Problem List:       NSTEMI (non-ST elevated myocardial infarction) (07/24/2022)           Assessment:   Sepsis likely from UTI  Presented with leukocytosis and tachycardia  CT scan of the chest showed small left pleural effusion and evidence of pulmonary hypertension  UA consistent with UTI  Blood culture and urine culture came negative  Initially started on empirical broad-spectrum antibiotic vancomycin and Zosyn  MRSA test is negative  Antibiotic de-escalated to ceftriaxone  Leukocytosis improved  11/29: Patient will complete 5 days course of antibiotics today      Anemia  Hemoglobin has been dropping from  From 12-10--8.2  I have ordered H&H monitor every 6 hours  Fecal occult blood test  Given patient has history of DVT I will continue the anticoagulation for now  Fecal occult blood test  Will monitor closely  No obvious bleeding noted  11/29: So far H&H stable between 8-9, continue heparin drip, pending hematology evaluation) final recommendation, fecal occult blood test pending, patient had 1 bowel movement yesterday which is brown, no obvious bleeding noted    History of DVT  Patient had bilateral lower  extremity DVT back in September 2023 currently on Pradaxa  Repeat venous ultrasound showed left femoral popliteal DVT extending to the left peroneal vein and posterior tibialis vein with associated mild soft tissue swelling/edema and short segment nonocclusive thrombus within the right common femoral vein  I will involve hematology to guide on treatment  11/29: CTA ruled out PE, for now we will continue heparin drip, will switch her back to DOAC once hematology clears    NSTEMI  Elevated troponin  Likely demand ischemia  Troponin trended up from 90-108  Patient is on aspirin, statin and Pradaxa  Cardiology on board, recommended to get CTA  Follow-up recommendation  11/29: 2D echo showed normal EF, no LV wall motion abnormality, elevated troponin is likely from demand ischemia and anemia, continue aspirin/statin/Coreg/losartan.      Chronic stable medical condition  Hypertension: Continue amlodipine/metoprolol/losartan  11/29: Blood pressure within acceptable range    Type 2 diabetes  Carb controlled diet  Sliding scale  Hypoglycemia protocol    Morbid obesity  Outpatient follow-up    Bedbound  Wound on her left iliac area  Wound nurse consult        Nutrition: Heart healthy diet  Malnutrition Documentation    Moderate Malnutrition related to inadequate nutritional  intake in the setting of social/environmental circumstances as evidenced by <50%EER x 1 month, mild muscle losses (temporalis, pectorilism deltoid)  Patient has BMI=Body mass index is 42.62 kg/m.  Diagnosis: Obesity Class 3 (formerly known as Morbid Obesity) based on BMI criteria            Recent Labs     07/27/22  0455 07/26/22  0256   Potassium 4.1 3.4*       Diagnosis: Hypokalemia        Code Status: Full Code    Dispo: Pending    Family Contact: Updated her niece    DVT Prophylaxis:   Current Facility-Administered Medications (Includes Only Anticoagulants, Misc. Hematological)   Medication Dose Route Last Admin    [Held by provider]  dabigatran (PRADAXA) capsule 150 mg  150 mg Oral 150 mg at 07/26/22 1106    heparin (porcine) injection 4,100 Units  40 Units/kg Intravenous      heparin (porcine) injection 8,250 Units  80 Units/kg Intravenous      heparin 25,000 units in dextrose 5% 500 mL infusion (premix)  18 Units/kg/hr Intravenous 14 Units/kg/hr at 07/27/22 0949          CHART  REVIEW & DISCUSSION     The following chart items were reviewed as of 12:46 PM on 07/27/22:  '[x]'$  Lab Results '[x]'$  Imaging Results   '[x]'$  Problem List  '[x]'$  Current Orders '[x]'$  Current Medications  '[]'$  Allergies  '[]'$  Code Status '[]'$  Previous Notes   '[]'$  SDoH    The management and plan of care for this patient was discussed with the following specialty consultants:  '[x]'$  Cardiology  '[]'$  Gastroenterology                 '[]'$  Infectious Disease  '[]'$  Pulmonology '[]'$  Neurology                '[]'$  Nephrology  '[]'$  Neurosurgery '[]'$  Orthopedic Surgery  '[]'$  Heme/Onc  '[]'$  General Surgery '[]'$  Psychiatry                                   '[]'$  Palliative    SUBJECTIVE     Meredith Baker  More alert and communicative today  Alert oriented x 3  Denies chest pain, lightheadedness, nausea or vomiting  No bleeding reported or noted    MEDICATIONS     Current Facility-Administered Medications   Medication Dose Route Frequency    aspirin  81 mg Oral Daily    atorvastatin  40 mg Oral QHS    balsam peru-castor oil (VENELEX)   Topical Q12H    carvedilol  12.5 mg Oral Q12H [place]    cefTRIAXone  1 g Intravenous Q24H    [Held by provider] dabigatran  150 mg Oral Q12H [place]    gabapentin  200 mg Oral Q8H [place]    insulin lispro  1-3 Units Subcutaneous QHS    insulin lispro  1-5 Units Subcutaneous TID AC    losartan (COZAAR) 100 mg, hydroCHLOROthiazide (HYDRODIURIL) 25 mg for HYZAAR   Oral Daily    miconazole 2 % with zinc oxide   Topical Q12H    senna-docusate  1 tablet Oral QHS       PHYSICAL EXAM     Vitals:    07/27/22 1125   BP: 100/62   Pulse: (!) 49   Resp: 18   Temp: 97.5 F (36.4 C)   SpO2: (!) 88%       Temperature:  Temp  Min: 97.5 F (36.4 C)  Max: 98.2 F (  36.8 C)  Pulse: Pulse  Min: 49  Max: 94  Respiratory: Resp  Min: 16  Max: 20  Non-Invasive BP: BP  Min: 100/62  Max: 144/70  Pulse Oximetry SpO2  Min: 88 %  Max: 99 %    Intake and Output Summary (Last 24 hours) at Date Time    Intake/Output Summary (Last 24 hours) at 07/27/2022 1246  Last data filed at 07/26/2022 1600  Gross per 24 hour   Intake --   Output 600 ml   Net -600 ml         Chronically sick looking  GEN APPEARANCE: Normal;  A&OX3  HEENT: PERLA; EOMI; Conjunctiva Clear  NECK: Supple; No bruits  CVS: RRR, S1, S2; No M/G/R  LUNGS: CTAB; No Wheezes; No Rhonchi: No rales  ABD: Soft; No TTP; + Normoactive BS  EXT: Bilateral lower extremity edema  NEURO: Alert oriented x 3, weak all over  MENTAL STATUS: Alert oriented x 3    LABS     Recent Labs   Lab 07/27/22  0455 07/26/22  2238 07/26/22  1600 07/26/22  0256 07/24/22  0638   WBC  --  8.99  --  8.75 11.91*   RBC  --  3.07*  --  2.59* 3.29*   Hgb 8.3* 9.3* 8.8* 8.2* 10.2*   Hematocrit 26.5* 30.5* 28.5* 26.4* 33.2*   MCV  --  99.3*  --  101.9* 100.9*   Platelets  --  481*  --  464* 377*         Recent Labs   Lab 07/27/22  0455 07/26/22  0256 07/24/22  0638 07/23/22  1823   Sodium 138 142 143 143   Potassium 4.1 3.4* 3.4* 3.7   Chloride 109 111 109 108   CO2 '21 24 24 24   '$ BUN 10.0 10.0 13.0 12.0   Creatinine 0.8 0.7 0.7 0.6   Glucose 243* 174* 175* 220*   Calcium 8.3 7.9 8.6 8.5   Magnesium 1.8 1.0*  --   --          Recent Labs   Lab 07/27/22  0455 07/24/22  0638 07/23/22  1823   ALT '6 7 11   '$ AST (SGOT) '10 12 16   '$ Bilirubin, Total 0.3 0.8 0.8   Albumin 1.5* 1.7* 1.9*   Alkaline Phosphatase 53 69 76         Recent Labs   Lab 07/24/22  0638 07/24/22  0026 07/23/22  2218   Creatine Kinase (CK)  --   --  43   hs Troponin-I 103.4* 108.5*  --    hs Troponin-I Delta  --  calc n/a  --          Recent Labs   Lab 07/26/22  1600 07/24/22  0117 07/23/22  2218   PT INR  --  1.6* 1.5*   PT  --  19.0* 17.3*   PTT 62* 32 24*          Microbiology Results (last 15 days)       Procedure Component Value Units Date/Time    MRSA culture [id number] Collected: 07/24/22 1932    Order Status: Completed Specimen: Culturette from Nasal Swab Updated: 07/26/22 0214     Culture MRSA Surveillance Negative for Methicillin Resistant Staph aureus    MRSA culture [id number] Collected: 07/24/22 1932    Order Status: Completed Specimen: Culturette from Throat Updated: 07/26/22 0214     Culture MRSA Surveillance Negative for Methicillin Resistant  Staph aureus    Urine culture [id number] Collected: 07/24/22 1932    Order Status: Completed Specimen: Bladder Updated: 07/26/22 0413    Narrative:      ORDER#: [id number]                                    ORDERED BY: Enis Gash  SOURCE: Urine                                        COLLECTED:  07/24/22 19:32  ANTIBIOTICS AT COLL.:                                RECEIVED :  07/24/22 20:10  Culture Urine                              FINAL       07/26/22 04:13  07/26/22   No growth of >1,000 CFU/ML, No further work      Culture Blood Aerobic and Anaerobic [id number] Collected: 07/23/22 2218    Order Status: Completed Specimen: Blood, Venipuncture Updated: 07/27/22 0221    Narrative:      The order will result in two separate 8-65m bottles  Please do NOT order repeat blood cultures if one has been  drawn within the last 48 hours  UNLESS concerned for  endocarditis  AVOID BLOOD CULTURE DRAWS FROM CENTRAL LINE IF POSSIBLE  Indications:->Sepsis  ORDER#: [id number]                                   ORDERED BY: BEnis Gash SOURCE: Blood, Venipuncture                          COLLECTED:  07/23/22 22:18  ANTIBIOTICS AT COLL.:                                RECEIVED :  07/24/22 01:42  Culture Blood Aerobic and Anaerobic        PRELIM      07/27/22 02:21  07/25/22   No Growth after 1 day/s of incubation.  07/26/22   No Growth after 2 day/s of incubation.  07/27/22   No Growth after 3 day/s of incubation.      Culture  Blood Aerobic and Anaerobic [[id number]: 07/23/22 2218    Order Status: Completed Specimen: Blood, Venipuncture Updated: 07/27/22 0221    Narrative:      The order will result in two separate 8-144mbottles  Please do NOT order repeat blood cultures if one has been  drawn within the last 48 hours  UNLESS concerned for  endocarditis  AVOID BLOOD CULTURE DRAWS FROM CENTRAL LINE IF POSSIBLE  Indications:->Sepsis  ORDER#: [id number]                                  ORDERED BY: BREnis GashSOURCE: Blood, Venipuncture  COLLECTED:  07/23/22 22:18  ANTIBIOTICS AT COLL.:                                RECEIVED :  07/24/22 01:42  Culture Blood Aerobic and Anaerobic        PRELIM      07/27/22 02:21  07/25/22   No Growth after 1 day/s of incubation.  07/26/22   No Growth after 2 day/s of incubation.  07/27/22   No Growth after 3 day/s of incubation.      COVID-19 (SARS-CoV-2) and Influenza A/B, NAA (Liat Rapid) [id number] Collected: 07/23/22 2218    Order Status: Completed Specimen: Culturette from Nasopharyngeal Updated: 07/23/22 2259     Purpose of COVID testing Diagnostic -[facility]     SARS-CoV-2 Specimen Source Nasal Swab     SARS CoV 2 Overall Result Not Detected     Comment: __________________________________________________  -A result of "Detected" indicates POSITIVE for the    presence of SARS CoV-2 RNA  -A result of "Not Detected" indicates NEGATIVE for the    presence of SARS CoV-2 RNA  __________________________________________________________  Test performed using the Roche cobas Liat SARS-CoV-2 assay. This assay is  only for use under the Food and Drug Administration's Emergency Use  Authorization. This is a real-time RT-PCR assay for the qualitative  detection of SARS-CoV-2 RNA. Viral nucleic acids may persist in vivo,  independent of viability. Detection of viral nucleic acid does not imply the  presence of infectious virus, or that virus nucleic acid is the cause of  clinical  symptoms. Negative results do not preclude SARS-CoV-2 infection and  should not be used as the sole basis for diagnosis, treatment or other  patient management decisions. Negative results must be combined with  clinical observations, patient history, and/or epidemiological information.  Invalid results may be due to inhibiting substances in the specimen and  recollection should occur. Please see Fact Sheets for patients and providers  located:  https://www.benson-chung.com/          Influenza A Not Detected     Influenza B Not Detected     Comment: Test performed using the Roche cobas Liat SARS-CoV-2 & Influenza A/B assay.  This assay is only for use under the Food and Drug Administration's  Emergency Use Authorization. This is a multiplex real-time RT-PCR assay  intended for the simultaneous in vitro qualitative detection and  differentiation of SARS-CoV-2, influenza A, and influenza B virus RNA. Viral  nucleic acids may persist in vivo, independent of viability. Detection of  viral nucleic acid does not imply the presence of infectious virus, or that  virus nucleic acid is the cause of clinical symptoms. Negative results do  not preclude SARS-CoV-2, influenza A, and/or influenza B infection and  should not be used as the sole basis for diagnosis, treatment or other  patient management decisions. Negative results must be combined with  clinical observations, patient history, and/or epidemiological information.  Invalid results may be due to inhibiting substances in the specimen and  recollection should occur. Please see Fact Sheets for patients and providers  located: http://olson-hall.info/.         Narrative:      o Collect and clearly label specimen type:  o PREFERRED-Upper respiratory specimen: One Nasal Swab in  Transport Media.  o Hand deliver to laboratory ASAP  Diagnostic -[facility]             RADIOLOGY  CT Angiogram Chest    Result Date: 07/27/2022  1. No evidence of pulmonary  embolism. 2. Stable small left pleural effusion. 3. Stable small pericardial effusion. 4. Findings which may be associated with pulmonary hypertension. Legrand Como [facility] 07/27/2022 12:39 AM    XR Hip left 2-3 vw with pelvis    Result Date: 07/25/2022    Mild to moderate bilateral hip degenerative changes. Moderate rectal stool burden. Tama Headings, MD 07/25/2022 12:43 PM    US Venous Low Extrem Duplex [place] Bilat    Result Date: 07/24/2022   Left femoral-popliteal DVT extending to the left peroneal veins and posterior tibial veins, with associated mild soft tissue swelling/edema. Short segment nonocclusive thrombus within the right common femoral vein. Oley Balm, MD 07/24/2022 5:18 PM    CT Abd/Pelvis without Contrast    Result Date: 07/24/2022   1. Nonobstructing left renal stones. 2. Small bladder stones. 3. Additional incidental findings as described above. Edison Simon, MD 07/24/2022 1:43 AM    CT Chest without Contrast    Result Date: 07/24/2022  Small left pleural effusion, with minimal bibasilar areas of atelectasis. No focal consolidation. Slightly patulous and fluid-filled esophagus can be seen in the setting of gastroesophageal reflux. 2.6 x 2.0 x 3.5 cm low-attenuation lesion within the left thyroid lobe. Correlation with ultrasound is recommended for follow-up. Enlarged main pulmonary trunk, suggestive of underlying pulmonary arterial hypertension. Status post cholecystectomy. Nonobstructing left renal calculus. Timmie Foerster MD, MD 07/24/2022 1:40 AM    X-ray chest AP portable    Result Date: 07/23/2022   1. Small left pleural effusion. Dense left basilar airspace opacity suspicious for pneumonia. 2. Air-filled structure projecting over the lower mediastinum suggestive of hiatal hernia. Air also noted distending the esophagus. If indicated, further assessment can be made with CT. Denice Paradise, MD 07/23/2022 10:42 PM   Echo Results       Procedure Component Value Units Date/Time     Echo Adult TTE Complete [id number] Resulted: 07/26/22 1305     Updated: 07/26/22 1305          No results found for this or any previous visit.    Signed,  Arley Phenix, MD  12:46 PM 07/27/2022

## 2022-07-27 NOTE — PT Progress Note (Signed)
[facility]  Physical Therapy Attempt Note    Patient:  Meredith Baker MRN#:  [id number]  Unit:  25 [facility] Room/Bed:  A2502/A2502-B      Physical Therapy treatment attempted on 07/27/2022 at 3:12 PM.     PT Visit Cancellation Reason: Patient sleeping. Unable to wake patient with sternal rub or to voice.        Physical therapy will follow up at a later time/date as able.     RN aware.    Rozelle Logan, PT, DPT  07/27/2022  3:13 PM    Physical Therapist  Physical Medicine and Rehabilitation  Mon, Wed, Thurs 8-4:30pm  [place], Fri 12:30-9:00pm  [phone]

## 2022-07-27 NOTE — Plan of Care (Signed)
NURSING SHIFT NOTE     Patient: Meredith Baker  Day: 3      SHIFT EVENTS     Shift Narrative/Significant Events (PRN med administration, fall, RRT, etc.):     Pt alert and oriented X4 on room air. All med given as ordered. Trio rounding done with doctor [place]. Care of plan discussed with patient.   Safety and fall precautions remain in place. Purposeful rounding completed.          ASSESSMENT     Changes in assessment from patient's baseline this shift:    Neuro: No  CV: No  Pulm: No  Peripheral Vascular: No  HEENT: No  GI: No  BM during shift: No, Last BM: Last BM Date: 07/26/22  GU: No   Integ: No  MS: No    Pain: None  Pain Interventions: Rest  Medications Utilized:   Mobility: PMP Activity: Step 3 - Bed Mobility of Distance Walked (ft) (Step 6,7): 0 Feet           Lines     Patient Lines/Drains/Airways Status       Active Lines, Drains and Airways       Name Placement date Placement time Site Days    Peripheral IV 07/24/22 20 G Anterior;Left Forearm 07/24/22  0030  Forearm  3    External Urinary Catheter 07/23/22  1602  --  4                         VITAL SIGNS     Vitals:    07/27/22 1923   BP: 171/87   Pulse: 76   Resp: 16   Temp: 98.2 F (36.8 C)   SpO2: (!) 88%       Temp  Min: 97.5 F (36.4 C)  Max: 98.2 F (36.8 C)  Pulse  Min: 49  Max: 101  Resp  Min: 16  Max: 20  BP  Min: 100/62  Max: 171/87  SpO2  Min: 88 %  Max: 99 %      Intake/Output Summary (Last 24 hours) at 07/27/2022 1953  Last data filed at 07/27/2022 1700  Gross per 24 hour   Intake --   Output 1000 ml   Net -1000 ml          CARE PLAN       Problem: Pain interferes with ability to perform ADL  Goal: Pain at adequate level as identified by patient  Outcome: Progressing  Flowsheets (Taken 07/27/2022 1952)  Pain at adequate level as identified by patient:   Identify patient comfort function goal   Assess for risk of opioid induced respiratory depression, including snoring/sleep apnea. Alert healthcare team of risk factors identified.    Reassess pain within 30-60 minutes of any procedure/intervention, per Pain Assessment, Intervention, Reassessment (AIR) Cycle   Assess pain on admission, during daily assessment and/or before any "as needed" intervention(s)     Problem: Safety  Goal: Patient will be free from injury during hospitalization  Outcome: Progressing  Flowsheets (Taken 07/27/2022 1952)  Patient will be free from injury during hospitalization:   Use appropriate transfer methods   Assess patient's risk for falls and implement fall prevention plan of care per policy   Ensure appropriate safety devices are available at the bedside   Provide and maintain safe environment   Hourly rounding   Include patient/ family/ care giver in decisions related to safety     Problem: Fluid and  Electrolyte Imbalance/ Endocrine  Goal: Fluid and electrolyte balance are achieved/maintained  Flowsheets (Taken 07/27/2022 1952)  Fluid and electrolyte balance are achieved/maintained:   Monitor/assess lab values and report abnormal values   Monitor for muscle weakness   Assess and reassess fluid and electrolyte status   Observe for cardiac arrhythmias     Problem: Hemodynamic Status: Cardiac  Goal: Stable vital signs and fluid balance  Flowsheets (Taken 07/26/2022 2107 by Elodia Florence, RN)  Stable vital signs and fluid balance:   Assess signs and symptoms associated with cardiac rhythm changes   Monitor lab values

## 2022-07-27 NOTE — Progress Notes (Signed)
CARDIOLOGY PROGRESS NOTE    Date Time: 07/27/22 12:11 PM  Patient Name: Meredith Baker, Meredith Baker      Assessment:     Mildly elevated HS troponin I level; likely demand ischemia; chest pain-free  Abnormal EKG; right bundle branch block and left anterior fascicular block  Coronary calcification, seen on CTA chest; suggestive of CAD  Normal LV systolic function, with EF 60 to 65%  Mild pulmonic, mitral, and tricuspid regurgitation   Hypertension, hypertensive cardiovascular disease  Diabetes mellitus, A1c 6.0%  Hx of DVT/PE  Arthritis and being bed bound  Left lower extremity laterally rotated; Xray reports bilateral hip degenerative changes     Plan:   On heparin infusion, for active DVT  Aspirin 81 mg p.o. once a day  Atorvastatin 40 mg p.o. once a day  Carvedilol 12.5 mg p.o. twice a day  Losartan 100 mg p.o. once a day  Hydrochlorothiazide 25 mg p.o. once a day  CBC and BMP level  Work-up for CAD once medically stable, can be done as outpatient  Out of bed, sit in chair, and ambulate with PT/OT    Subjective/chief complaint:   Patient is seen and examined  All medications and labs reviewed    Patient appears fatigued. She denies chest pain or shortness of breath.     Echocardiogram ([id number]): LV systolic function is normal with an EF of 60 to 65%.  Severe concentric LVH.  Grade I LV diastolic dysfunction. Left atrium is moderately dilated.  Mild pulmonic regurgitation.  Mild mitral regurgitation. Mild tricuspid regurgitation. Mild pulmonary hypertension with estimated right ventricular systolic pressure of 34 mmHg.      Medications:     Current Facility-Administered Medications   Medication Dose Route Frequency    aspirin  81 mg Oral Daily    atorvastatin  40 mg Oral QHS    balsam peru-castor oil (VENELEX)   Topical Q12H    carvedilol  12.5 mg Oral Q12H [facility]    cefTRIAXone  1 g Intravenous Q24H    [Held by provider] dabigatran  150 mg Oral Q12H [facility]    gabapentin  200 mg Oral Q8H [place]    insulin lispro  1-3 Units  Subcutaneous QHS    insulin lispro  1-5 Units Subcutaneous TID AC    losartan (COZAAR) 100 mg, hydroCHLOROthiazide (HYDRODIURIL) 25 mg for HYZAAR   Oral Daily    miconazole 2 % with zinc oxide   Topical Q12H    senna-docusate  1 tablet Oral QHS       Review of Systems:   General ROS: no weight loss, no weight gain, no fever, no chills  Hematological and Lymphatic ROS: negative  Endocrine ROS: no fatigue, no polyuria, no polydipsia, no general weakness  Respiratory ROS: no shortness of breath, no cough, no wheezes and no hemoptysis  Cardiovascular ROS: no chest pain, no palpitations, no PND and no orthopnea, no DOE  Gastrointestinal ROS: no nausea, no vomiting, no diarrhea, no constipation, no blood in stool and no abdominal pain  Musculoskeletal ROS: no muscle pain, no muscle weakness, no joint pain, no swelling and no redness  Neurological ROS: no headache, no dizziness, no diplopia, no focal weakness, no seizure  Dermatological ROS: no rash, no itching and no ecchymoses, no pressure ulcer      Physical Exam:   BP 100/62   Pulse (!) 49   Temp 97.5 F (36.4 C) (Oral)   Resp 18   Ht 1.575 m ('5\' 2"'$ )   Wt 105.7  kg (233 lb 0.4 oz)   SpO2 (!) 88%   BMI 42.62 kg/m       Intake/Output Summary (Last 24 hours) at 07/27/2022 1211  Last data filed at 07/26/2022 1600  Gross per 24 hour   Intake --   Output 600 ml   Net -600 ml         General appearance - alert, ill appearing, and in no distress  Mental status - alert, oriented to person, place, and time  HEENT:normocephalic, no icterus, no pallor, no cyanosis  Neck: supple, no JVD, no thyromegaly, no carotid bruits  Chest - clear to auscultation, no wheezes, rales or rhonchi, symmetric air entry  Heart - normal rate, regular rhythm, normal S1, S2, no S3 ,+murmurs, rubs, clicks or gallops  Abdomen - soft, nontender, nondistended, no masses or organomegaly  Neurological - alert, oriented, normal speech, no focal findings  noted  Musculoskeletal - + joint tenderness,  +deformity   Extremities - peripheral pulses palpable,trace pedal edema, no clubbing or cyanosis  Skin - normal color , no rashes.    Labs:     CBC w/Diff   Recent Labs   Lab 07/27/22  0455 07/26/22  2238 07/26/22  1600 07/26/22  0256 07/24/22  0638   WBC  --  8.99  --  8.75 11.91*   Hgb 8.3* 9.3* 8.8* 8.2* 10.2*   Hematocrit 26.5* 30.5* 28.5* 26.4* 33.2*   Platelets  --  481*  --  464* 377*            Basic Metabolic Profile   Recent Labs   Lab 07/27/22  0455 07/26/22  0256 07/24/22  0638 07/23/22  1823   Sodium 138 142 143 143   Potassium 4.1 3.4* 3.4* 3.7   Chloride 109 111 109 108   CO2 '21 24 24 24   '$ BUN 10.0 10.0 13.0 12.0   Creatinine 0.8 0.7 0.7 0.6   Calcium 8.3 7.9 8.6 8.5   ALT 6  --  7 11   AST (SGOT) 10  --  12 16   Glucose 243* 174* 175* 220*            Cardiac Enzymes   Recent Labs   Lab 07/24/22  0638 07/24/22  0026 07/23/22  2218 07/23/22  1648   Creatine Kinase (CK)  --   --  43  --    hs Troponin-I 103.4* 108.5*  --  92.0*   hs Troponin-I Delta  --  calc n/a  --   --          Lab Results   Component Value Date    BNP 4,800 (H) 07/26/2022          Thyroid Studies    Recent Labs   Lab 07/25/22  0402   TSH 0.33*             Lipid Profile   Recent Labs   Lab 07/26/22  0256   Cholesterol 92            Radiology Results (24 Hour)       Procedure Component Value Units Date/Time    CT Angiogram Chest [id number] Collected: 07/27/22 0031    Order Status: Completed Updated: 07/27/22 0041    Narrative:      CT ANGIOGRAM CHEST: 07/26/2022 5:26 PM    CLINICAL INFORMATION  R/O PE.    COMPARISON  CT, abdomen/pelvis dated 07/24/2022; CT, chest dated 07/24/2022.    TECHNIQUE  Multiple  helical thin-sections were obtained from the lung apices to the  lung bases following rapid bolus administration of 100cc of Omnipaque 350  intravenous contrast. Coronal and sagittal reformatted and MIP 3-D images  were then created and reviewed.    The following dose reduction techniques were utilized: automated exposure  control  and/or adjustment of the mA and/or KV according to patient size,  and the use of an iterative reconstruction technique.    FINDINGS    Pulmonary arteries:  There is adequate contrast opacification of the  pulmonary arteries. There is no evidence of filling defect within the  evaluated pulmonary arteries to the subsegmental level. Respiratory motion  limits the ability to detect small subsegmental pulmonary emboli.  Prominence of the main pulmonary arteries may be seen with pulmonary  hypertension.    Lung and airways: There is mild linear scarring versus atelectasis within  the lung bases. Left basilar compressive atelectasis associated with  pleural effusion. No focal pulmonary consolidation. Central airways are  clear.  Pleura: Small left pleural effusion is unchanged. No pneumothorax.    Heart: Heart size is normal. Small pericardial effusion is unchanged.  Coronary atherosclerosis.  Thoracic aorta: Atherosclerosis and ectasia of the thoracic aorta without  evidence of dissection.    Mediastinum and hila: No pathologically enlarged lymph nodes.  Chest Wall and axilla: Unremarkable. Redemonstrated hypoattenuating left  thyroid nodularity measuring up to 3.5 cm.    Upper Abdomen: Status post cholecystectomy. Patulous esophagus.    Osseous structures: Diffuse degenerative changes present.      Impression:          1. No evidence of pulmonary embolism.  2. Stable small left pleural effusion.  3. Stable small pericardial effusion.  4. Findings which may be associated with pulmonary hypertension.    Monna Fam  07/27/2022 12:39 AM            Discussed with Vonzell Schlatter, MD.      Wong Steadham S Arryana Tolleson, PA, PA-C  07/27/2022 12:11 PM

## 2022-07-27 NOTE — UM Notes (Signed)
PATIENT NAME: Meredith Baker,Meredith Baker   DOB: 01/15/1946   PMH:  has a past medical history of Diabetes mellitus, History of blood clots, and Hypertension.  PSH:  has a past surgical history that includes Tubal ligation.     Continued Stay Review  Identified [place] barriers:   PT/OT Discharge Recommendation: LTC (with PT/OT at facility)  _____________________  CSR Date: 07/27/22, Unit: [facility]    Subjective: Patient had an uneventful night. Hep Drip started per order. No signs of GI bleed noted. H&H 8.3 & 26.5     VS:   I&O   Temp:  [97.5 F (36.4 C)-98.2 F (36.8 C)]   Heart Rate:  [49-94]   Resp Rate:  [18-20]   BP: (100-144)/(62-80)   SpO2:  [88 %-99 %]   Weight:  [105.7 kg (233 lb 0.4 oz)]   I/O last 3 completed shifts:  In: 33 [P.O.:300; IV Piggyback:650]  Out: 1600 [Urine:1600]        Abnormal Labs: h+h 8.8/28.4    Diagnostics:CTA chest: No evidence of pulmonary embolism.   2. Stable small left pleural effusion.   3. Stable small pericardial effusion.   4. Findings which may be associated with pulmonary hypertension.     Medications:   Current Facility-Administered Medications   Medication Dose Route Frequency    aspirin  81 mg Oral Daily    atorvastatin  40 mg Oral QHS    balsam peru-castor oil (VENELEX)   Topical Q12H    carvedilol  12.5 mg Oral Q12H [place]    [Held by provider] dabigatran  150 mg Oral Q12H [place]    gabapentin  200 mg Oral Q8H [place]    insulin lispro  1-3 Units Subcutaneous QHS    insulin lispro  1-5 Units Subcutaneous TID AC    losartan (COZAAR) 100 mg, hydroCHLOROthiazide (HYDRODIURIL) 25 mg for HYZAAR   Oral Daily    miconazole 2 % with zinc oxide   Topical Q12H    senna-docusate  1 tablet Oral QHS      heparin infusion 25,000 units/500 mL (VTE/Moderate Intensity) Stopped (07/27/22 1618)     IV PRN meds received within last 24 hrs:  n/a    Assessment/Plan: Elevated troponin  Likely demand ischemia  Troponin trended up from 90-108  Patient is on aspirin, statin and Pradaxa  Cardiology  on board, recommended to get CTA  Follow-up recommendation  11/29: 2D echo showed normal EF, no LV wall motion abnormality, elevated troponin is likely from demand ischemia and anemia, continue aspirin/statin/Coreg/losartan.  CTA ruled out PE, for now we will continue heparin drip, will switch her back to DOAC once hematology clears         Primary Payor: MEDICARE [employer] / Plan: [employer] [facility] / Product Type: MANAGED MEDICARE /      UTILIZATION REVIEW CONTACT: Name: Jadene Pierini. Delice Lesch RN BSN  Clinical Case Manager  - Utilization Review  [facility]  Address:  [place], [place]  [address]  NPI:   [id number]  Tax ID:  [id number]  Phone: [phone]  Fax: [phone]    Please use fax number [phone] to provide authorization for hospital services or to request additional information.        NOTES TO REVIEWER:    This clinical review is based on/compiled from documentation provided by the treatment team within the patient's medical record.
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
## 2022-07-28 LAB — CBC AND DIFFERENTIAL
Absolute NRBC: 0 10*3/uL (ref 0.00–0.00)
Basophils Absolute Automated: 0.07 10*3/uL (ref 0.00–0.08)
Basophils Automated: 0.7 %
Eosinophils Absolute Automated: 0.34 10*3/uL (ref 0.00–0.44)
Eosinophils Automated: 3.6 %
Hematocrit: 28.2 % — ABNORMAL LOW (ref 34.7–43.7)
Hgb: 8.8 g/dL — ABNORMAL LOW (ref 11.4–14.8)
Immature Granulocytes Absolute: 0.2 10*3/uL — ABNORMAL HIGH (ref 0.00–0.07)
Immature Granulocytes: 2.1 %
Instrument Absolute Neutrophil Count: 6.35 10*3/uL — ABNORMAL HIGH (ref 1.10–6.33)
Lymphocytes Absolute Automated: 1.84 10*3/uL (ref 0.42–3.22)
Lymphocytes Automated: 19.2 %
MCH: 31 pg (ref 25.1–33.5)
MCHC: 31.2 g/dL — ABNORMAL LOW (ref 31.5–35.8)
MCV: 99.3 fL — ABNORMAL HIGH (ref 78.0–96.0)
MPV: 9.1 fL (ref 8.9–12.5)
Monocytes Absolute Automated: 0.76 10*3/uL (ref 0.21–0.85)
Monocytes: 7.9 %
Neutrophils Absolute: 6.35 10*3/uL — ABNORMAL HIGH (ref 1.10–6.33)
Neutrophils: 66.5 %
Nucleated RBC: 0 /100 WBC (ref 0.0–0.0)
Platelets: 529 10*3/uL — ABNORMAL HIGH (ref 142–346)
RBC: 2.84 10*6/uL — ABNORMAL LOW (ref 3.90–5.10)
RDW: 19 % — ABNORMAL HIGH (ref 11–15)
WBC: 9.56 10*3/uL — ABNORMAL HIGH (ref 3.10–9.50)

## 2022-07-28 LAB — BASIC METABOLIC PANEL
Anion Gap: 6 (ref 5.0–15.0)
BUN: 11 mg/dL (ref 7.0–21.0)
CO2: 23 mEq/L (ref 17–29)
Calcium: 8.5 mg/dL (ref 7.9–10.2)
Chloride: 107 mEq/L (ref 99–111)
Creatinine: 0.7 mg/dL (ref 0.4–1.0)
Glucose: 189 mg/dL — ABNORMAL HIGH (ref 70–100)
Potassium: 4.5 mEq/L (ref 3.5–5.3)
Sodium: 136 mEq/L (ref 135–145)
eGFR: >60 mL/min/{1.73_m2} (ref >=60)

## 2022-07-28 LAB — GLUCOSE WHOLE BLOOD - POCT
Whole Blood Glucose POCT: 182 mg/dL — ABNORMAL HIGH (ref 70–100)
Whole Blood Glucose POCT: 261 mg/dL — ABNORMAL HIGH (ref 70–100)
Whole Blood Glucose POCT: 263 mg/dL — ABNORMAL HIGH (ref 70–100)
Whole Blood Glucose POCT: 289 mg/dL — ABNORMAL HIGH (ref 70–100)

## 2022-07-28 LAB — ANA SCREEN, IFA, WITH REFLEX TO TITER AND PATTERN
ANA Screen: POSITIVE — AB
ANA Titer: 1:640 {titer} — AB

## 2022-07-28 LAB — ERYTHROPOIETIN: Erythropoietin: 39 m[IU]/mL — ABNORMAL HIGH (ref 2.6–18.5)

## 2022-07-28 LAB — ANTI-XA,UFH: Anti-Xa, UFH: 0.07 IU/mL

## 2022-07-28 LAB — PHOSPHORUS: Phosphorus: 2.4 mg/dL (ref 2.3–4.7)

## 2022-07-28 LAB — PATHOLOGY REVIEW-ANA

## 2022-07-28 LAB — HEMOGLOBIN AND HEMATOCRIT, BLOOD
Hematocrit: 27.2 % — ABNORMAL LOW (ref 34.7–43.7)
Hgb: 8.4 g/dL — ABNORMAL LOW (ref 11.4–14.8)

## 2022-07-28 LAB — MAGNESIUM: Magnesium: 1.6 mg/dL (ref 1.6–2.6)

## 2022-07-28 MED ORDER — PANTOPRAZOLE SODIUM 40 MG PO TBEC
40.0000 mg | DELAYED_RELEASE_TABLET | Freq: Two times a day (BID) | ORAL | Status: DC
Start: 2022-07-28 — End: 2022-08-15
  Administered 2022-07-28 – 2022-08-15 (×35): 40 mg via ORAL
  Filled 2022-07-28 (×36): qty 1

## 2022-07-28 MED ORDER — INSULIN LISPRO 100 UNIT/ML SOLN (WRAP)
1.0000 [IU] | Freq: Every evening | Status: DC
Start: 2022-07-28 — End: 2022-08-15
  Administered 2022-07-28: 3 [IU] via SUBCUTANEOUS
  Administered 2022-07-29: 1 [IU] via SUBCUTANEOUS
  Administered 2022-07-30 – 2022-07-31 (×2): 2 [IU] via SUBCUTANEOUS
  Administered 2022-08-01 – 2022-08-07 (×6): 3 [IU] via SUBCUTANEOUS
  Administered 2022-08-08: 4 [IU] via SUBCUTANEOUS
  Administered 2022-08-09 – 2022-08-10 (×2): 1 [IU] via SUBCUTANEOUS
  Administered 2022-08-11: 3 [IU] via SUBCUTANEOUS
  Administered 2022-08-12: 4 [IU] via SUBCUTANEOUS
  Administered 2022-08-13: 2 [IU] via SUBCUTANEOUS
  Administered 2022-08-14: 4 [IU] via SUBCUTANEOUS
  Filled 2022-07-28 (×2): qty 9
  Filled 2022-07-28: qty 3
  Filled 2022-07-28: qty 9
  Filled 2022-07-28: qty 12
  Filled 2022-07-28 (×2): qty 3
  Filled 2022-07-28: qty 6
  Filled 2022-07-28 (×4): qty 9
  Filled 2022-07-28 (×2): qty 6
  Filled 2022-07-28 (×2): qty 9
  Filled 2022-07-28: qty 12

## 2022-07-28 MED ORDER — INSULIN LISPRO 100 UNIT/ML SOLN (WRAP)
1.0000 [IU] | Freq: Three times a day (TID) | Status: DC
Start: 2022-07-28 — End: 2022-08-15
  Administered 2022-07-29 (×2): 3 [IU] via SUBCUTANEOUS
  Administered 2022-07-30: 1 [IU] via SUBCUTANEOUS
  Administered 2022-07-30: 5 [IU] via SUBCUTANEOUS
  Administered 2022-07-31: 1 [IU] via SUBCUTANEOUS
  Administered 2022-08-01: 5 [IU] via SUBCUTANEOUS
  Administered 2022-08-01: 3 [IU] via SUBCUTANEOUS
  Administered 2022-08-02: 1 [IU] via SUBCUTANEOUS
  Administered 2022-08-02: 3 [IU] via SUBCUTANEOUS
  Administered 2022-08-02: 5 [IU] via SUBCUTANEOUS
  Administered 2022-08-04 (×2): 3 [IU] via SUBCUTANEOUS
  Administered 2022-08-05: 7 [IU] via SUBCUTANEOUS
  Administered 2022-08-05: 1 [IU] via SUBCUTANEOUS
  Administered 2022-08-05: 3 [IU] via SUBCUTANEOUS
  Administered 2022-08-06 (×2): 5 [IU] via SUBCUTANEOUS
  Administered 2022-08-07 – 2022-08-08 (×3): 3 [IU] via SUBCUTANEOUS
  Administered 2022-08-08: 7 [IU] via SUBCUTANEOUS
  Administered 2022-08-08: 5 [IU] via SUBCUTANEOUS
  Administered 2022-08-09: 1 [IU] via SUBCUTANEOUS
  Administered 2022-08-09: 5 [IU] via SUBCUTANEOUS
  Administered 2022-08-09: 7 [IU] via SUBCUTANEOUS
  Administered 2022-08-10 – 2022-08-11 (×3): 3 [IU] via SUBCUTANEOUS
  Administered 2022-08-11 – 2022-08-12 (×2): 5 [IU] via SUBCUTANEOUS
  Administered 2022-08-12: 3 [IU] via SUBCUTANEOUS
  Administered 2022-08-13: 5 [IU] via SUBCUTANEOUS
  Administered 2022-08-13 – 2022-08-14 (×2): 3 [IU] via SUBCUTANEOUS
  Administered 2022-08-14: 5 [IU] via SUBCUTANEOUS
  Filled 2022-07-28 (×2): qty 9
  Filled 2022-07-28 (×2): qty 15
  Filled 2022-07-28 (×2): qty 9
  Filled 2022-07-28: qty 15
  Filled 2022-07-28 (×2): qty 9
  Filled 2022-07-28: qty 15
  Filled 2022-07-28: qty 9
  Filled 2022-07-28: qty 15
  Filled 2022-07-28: qty 3
  Filled 2022-07-28: qty 9
  Filled 2022-07-28: qty 3
  Filled 2022-07-28 (×3): qty 15
  Filled 2022-07-28: qty 9
  Filled 2022-07-28: qty 15
  Filled 2022-07-28: qty 3
  Filled 2022-07-28: qty 9
  Filled 2022-07-28: qty 3
  Filled 2022-07-28: qty 21
  Filled 2022-07-28 (×2): qty 9
  Filled 2022-07-28 (×2): qty 21
  Filled 2022-07-28: qty 9
  Filled 2022-07-28: qty 15
  Filled 2022-07-28: qty 9
  Filled 2022-07-28 (×2): qty 15
  Filled 2022-07-28 (×2): qty 9

## 2022-07-28 MED ORDER — POLYETHYLENE GLYCOL 3350 17 G PO PACK
17.0000 g | PACK | Freq: Every day | ORAL | Status: DC
Start: 2022-07-28 — End: 2022-08-15
  Administered 2022-07-28 – 2022-08-15 (×14): 17 g via ORAL
  Filled 2022-07-28 (×18): qty 1

## 2022-07-28 MED ORDER — PANTOPRAZOLE SODIUM 40 MG PO TBEC
40.0000 mg | DELAYED_RELEASE_TABLET | Freq: Every morning | ORAL | Status: DC
Start: 2022-07-28 — End: 2022-07-28
  Administered 2022-07-28: 40 mg via ORAL
  Filled 2022-07-28: qty 1

## 2022-07-28 NOTE — PT Progress Note (Signed)
.Physical Therapy Treatment  [name]  Post Acute Care Therapy Recommendations   Discharge Recommendations:  SNF      DME needs IF patient is discharging home: Dressing stick, Long-handled sponge, Sock aid, Long-handled shoehorn, Hospital bed, Grab bars, BSC, Hoyer Lift    Therapy discharge recommendations may change with patient status.  Please refer to most recent note for up-to-date recommendations.    Unit: [facility]  Bed: A2502/A2502-B    ___________________________________________________    Time of treatment:  PT Received On: 07/28/22  Start Time: [id number]  Stop Time: 1007  Time Calculation (min): 24 min  Total Treatment Time (min): 12 (co tx)    Chart Review and Collaboration with Care Team: 3 minutes, not included in above time.    PT Visit Number: 2    ___________________________________________________    Precautions:   Precautions  Weight Bearing Status: no restrictions  Restricted BP Precautions: no R UE  Fall Risks: High, Impaired mobility, Muscle weakness  Other Precautions: Bleeding    Personal Protective Equipment (PPE)  gloves and procedure mask    Updated X-Rays/Tests/Labs:  Lab Results   Component Value Date/Time    HGB 8.8 (L) 07/28/2022 09:08 AM    HCT 28.2 (L) 07/28/2022 09:08 AM    K 4.5 07/28/2022 09:08 AM    NA 136 07/28/2022 09:08 AM    INR 1.6 (H) 07/24/2022 01:17 AM    TROPI 103.4 (AA) 07/24/2022 06:38 AM    TROPI 108.5 (AA) 07/24/2022 12:26 AM    TROPI calc n/a 07/24/2022 12:26 AM    TROPI 92.0 (AA) 07/23/2022 04:48 PM       All imaging reviewed, please see chart for details.      Subjective:  Pt reported I will try, come back anytime.         Pain Assessment  Pain Assessment: Numeric Scale (0-10)  Pain Score: 2-mild pain  POSS Score: Awake and Alert  Pain Location: Foot  Pain Orientation: Left  Pain Intervention(s): Medication (See eMAR)           Patient's medical condition is appropriate for Physical Therapy intervention at this time.  Patient is agreeable to  participation in the therapy session.      Objective:  Observation of Patient/Vital Signs:  Marland Kitchen  Vitals:    07/28/22 0957   BP: 123/75   Pulse: 94   Resp:    Temp:    SpO2: 97%    BP low sitting EOB     Cognition/Neuro Status  Arousal/Alertness: Appropriate responses to stimuli  Attention Span: Appears intact  Orientation Level: Oriented X4  Memory: Appears intact  Following Commands: Follows all commands and directions without difficulty  Safety Awareness: independent  Insights: Fully aware of deficits  Problem Solving: minimal assistance  Behavior: calm;cooperative  Motor Planning: decreased initiation;decreased processing speed  Coordination: intact  Hand Dominance: right handed    Musculoskeletal Examination  Gross ROM  Right Lower Extremity ROM % reduced: reduced by 75%  Left Lower Extremity ROM % Reduced: reduced by 75%        Gross Strength  Right Lower Extremity Strength: 2/5  Left Lower Extremity Strength: 2-/5            Functional Mobility  Rolling: Maximal Assist;to Right  Supine to Sit: Maximal Assist  Scooting to EOB: Maximal Assist  Sit to Supine: Maximal Assist  Sit to Stand: Unable to assess (Comment)  Stand to Sit: Unable to assess (Comment)  Transfers  Bed to Chair: Unable to assess (Comment)  Locomotion  Ambulation: Unable to assess (Comment)  Distance Walked (ft) (Step 6,7): 0 Feet          Neuro Re-Ed  Sitting Balance: moderate assist;with support (able to sit EOB for about 5 sec at SPV level)           Educated the Patient to role of physical therapy, plan of care, goals of therapy and safety with mobility and ADLs, discharge instructions, home safety with limited indication of understanding.  Additional education required..    Patient left in bed with alarm and all other medical equipment in place and call bell and all personal items/needs within reach.  RN notified of session outcome. CM team and attending have been previously notified of d/c recommendation; no updates to d/c recommendation  since that time.      Assessment:  Pt received with O.T. due to decreased activity tolerance. Pt req extra time to perform all mobility. Pt tsf to EOB, sat for about 5 minutes performing there ex and reaching act with O.T. Pt reported dizziness, requested to lay back down, pt had some dec.responsiveness,reclined supine, BP 120s/70s. Pt with inc. Alertness upon reclining supine. Vitals cont. To be monitored and low, RN informed. Placed heel protector boots to dec. ER in bil Les and heel protection. Bed alarm on, all needs in reach.                 PMP Activity: Step 4 - Dangle at Bedside  Distance Walked (ft) (Step 6,7): 0 Feet    Plan:  Treatment/Interventions: Exercise, Neuromuscular re-education, Functional transfer training, LE strengthening/ROM, Bed mobility, Compensatory technique education      PT Frequency: 2-3x/wk   Continue plan of care.    Goals:  Goals  Goal Formulation: With patient  Time for Goal Acheivement: By time of discharge  Goals: Select goal  Pt Will Roll Right: with minimal assist, to maximize functional mobility and independence  Pt Will Go Supine To Sit: with moderate assist, to maximize functional mobility and independence  Pt Will Achieve Sitting Balance: 3/5 sits without UE support up to 30 seconds, to maximize functional mobility and independence  Pt Will Perform Home Exer Program: independent, to maximize functional mobility and independence      Nelly Rout, PT,DPT  [id number]  07/28/2022 10:17 AM  Hours:W-F [facility]  Patient: Meredith Baker MRN#: [id number]  Unit: [place] [facility] Bed: A2502/A2502-B
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
## 2022-07-28 NOTE — Progress Notes (Signed)
[facility]  PROGRESS NOTE      Patient: Meredith Baker  Date: 07/28/2022   LOS: 4 Days  Admission Date: 07/23/2022   MRN: [id number]  Attending: Arley Phenix, MD  When on service as the attending, please contact me on [place] from 7 AM - 7 PM for non-urgent issues. For urgent matters use XTend page from 7 AM - 7 PM.       ASSESSMENT/PLAN     Meredith Baker is a 76 y.o. female admitted with NSTEMI (non-ST elevated myocardial infarction)    Interval Summary:   76 years old female with history of hypertension, type 2 diabetes, hyperlipidemia, osteoarthritis, bedbound, history of bilateral DVT who presented for evaluation of generalized weakness presented for weakness and inability to care for herself admitted with a diagnosis of UTI and NSTEMI cardiology on board  Patient Active Hospital Problem List:       NSTEMI (non-ST elevated myocardial infarction) (07/24/2022)           Assessment:   Anemia  Hemoglobin has been dropping from  From 12-10--8.2  I have ordered H&H monitor every 6 hours  Fecal occult blood test  Given patient has history of DVT I will continue the anticoagulation for now  Fecal occult blood test  Will monitor closely  No obvious bleeding noted  11/29: So far H&H stable between 8-9, continue heparin drip, pending hematology evaluation) final recommendation, fecal occult blood test pending, patient had 1 bowel movement yesterday which is brown, no obvious bleeding noted  11/30: H&H stayed stable, patient switched back to Pradaxa per heme-onc recommendation, fecal occult blood test came positive, GI consulted to weigh in, started on PPI    Sepsis likely from UTI  Presented with leukocytosis and tachycardia  CT scan of the chest showed small left pleural effusion and evidence of pulmonary hypertension  UA consistent with UTI  Blood culture and urine culture came negative  Initially started on empirical broad-spectrum antibiotic vancomycin and Zosyn  MRSA test is negative  Antibiotic  de-escalated to ceftriaxone  Leukocytosis improved  11/29-11/30: Patient will complete 5 days course of antibiotics today      History of DVT  Patient had bilateral lower extremity DVT back in September 2023 currently on Pradaxa  Repeat venous ultrasound showed left femoral popliteal DVT extending to the left peroneal vein and posterior tibialis vein with associated mild soft tissue swelling/edema and short segment nonocclusive thrombus within the right common femoral vein  I will involve hematology to guide on treatment  11/29: CTA ruled out PE, for now we will continue heparin drip, will switch her back to [place] once hematology clears  11/30: Patient is a switch to Pradaxa after hematology cleared the patient    NSTEMI  Elevated troponin  Likely demand ischemia  Troponin trended up from 90-108  Patient is on aspirin, statin and Pradaxa  Cardiology on board, recommended to get CTA  Follow-up recommendation  11/29-11/30: 2D echo showed normal EF, no LV wall motion abnormality, elevated troponin is likely from demand ischemia and anemia, continue aspirin/statin/Coreg/losartan.      Chronic stable medical condition  Hypertension: Continue amlodipine/metoprolol/losartan  11/29-11/30: Blood pressure within acceptable range    Type 2 diabetes  Carb controlled diet  Sliding scale  Hypoglycemia protocol    Morbid obesity  Outpatient follow-up    Bedbound  Wound on her left iliac area  Wound nurse consult        Nutrition: Heart healthy diet  Malnutrition Documentation    Moderate Malnutrition related to inadequate nutritional  intake in the setting of social/environmental circumstances as evidenced by <50%EER x 1 month, mild muscle losses (temporalis, pectorilism deltoid)         Patient has BMI=Body mass index is 44.19 kg/m.  Diagnosis: Obesity Class 3 (formerly known as Morbid Obesity) based on BMI criteria           Recent Labs     07/28/22  0908 07/27/22  0455 07/26/22  0256   Potassium 4.5 4.1 3.4*       Diagnosis:  Hypokalemia        Code Status: Full Code    Dispo: Pending    Family Contact: Updated her niece    DVT Prophylaxis:   Current Facility-Administered Medications (Includes Only Anticoagulants, Misc. Hematological)   Medication Dose Route Last Admin    dabigatran (PRADAXA) capsule 150 mg  150 mg Oral 150 mg at 07/28/22 0850          CHART  REVIEW & DISCUSSION     The following chart items were reviewed as of 2:46 PM on 07/28/22:  '[x]'$  Lab Results '[x]'$  Imaging Results   '[x]'$  Problem List  '[x]'$  Current Orders '[x]'$  Current Medications  '[]'$  Allergies  '[]'$  Code Status '[]'$  Previous Notes   '[]'$  SDoH    The management and plan of care for this patient was discussed with the following specialty consultants:  '[x]'$  Cardiology  '[]'$  Gastroenterology                 '[]'$  Infectious Disease  '[]'$  Pulmonology '[]'$  Neurology                '[]'$  Nephrology  '[]'$  Neurosurgery '[]'$  Orthopedic Surgery  '[]'$  Heme/Onc  '[]'$  General Surgery '[]'$  Psychiatry                                   '[]'$  Palliative    SUBJECTIVE     Meredith Baker  Comfortably lying on bed alert oriented x 3  No events reported per RN  Denies chest pain, lightheadedness, nausea or vomiting    MEDICATIONS     Current Facility-Administered Medications   Medication Dose Route Frequency    aspirin  81 mg Oral Daily    atorvastatin  40 mg Oral QHS    balsam peru-castor oil (VENELEX)   Topical Q12H    carvedilol  12.5 mg Oral Q12H [place]    dabigatran  150 mg Oral Q12H [place]    gabapentin  200 mg Oral Q8H [place]    insulin lispro  1-3 Units Subcutaneous QHS    insulin lispro  1-5 Units Subcutaneous TID AC    losartan (COZAAR) 100 mg, hydroCHLOROthiazide (HYDRODIURIL) 25 mg for HYZAAR   Oral Daily    miconazole 2 % with zinc oxide   Topical Q12H    pantoprazole  40 mg Oral QAM AC    polyethylene glycol  17 g Oral Daily    senna-docusate  1 tablet Oral QHS       PHYSICAL EXAM     Vitals:    07/28/22 1323   BP: 130/72   Pulse: 94   Resp: 18   Temp: 97.9 F (36.6 C)   SpO2: 98%       Temperature: Temp  Min: 97.7  F (36.5 C)  Max: 98.2 F (36.8 C)  Pulse: Pulse  Min: 36  Max: 101  Respiratory: Resp  Min: 16  Max: 19  Non-Invasive BP: BP  Min: 110/64  Max: 171/87  Pulse Oximetry SpO2  Min: 88 %  Max: 99 %    Intake and Output Summary (Last 24 hours) at Date Time    Intake/Output Summary (Last 24 hours) at 07/28/2022 1446  Last data filed at 07/27/2022 1700  Gross per 24 hour   Intake --   Output 1000 ml   Net -1000 ml         Chronically sick looking  GEN APPEARANCE: Normal;  A&OX3  HEENT: PERLA; EOMI; Conjunctiva Clear  NECK: Supple; No bruits  CVS: RRR, S1, S2; No M/G/R  LUNGS: CTAB; No Wheezes; No Rhonchi: No rales  ABD: Soft; No TTP; + Normoactive BS  EXT: Bilateral lower extremity edema  NEURO: Alert oriented x 3, weak all over  MENTAL STATUS: Alert oriented x 3    LABS     Recent Labs   Lab 07/28/22  0908 07/28/22  0439 07/27/22  1516 07/27/22  0455 07/26/22  2238 07/26/22  1600 07/26/22  0256   WBC 9.56*  --   --   --  8.99  --  8.75   RBC 2.84*  --   --   --  3.07*  --  2.59*   Hgb 8.8* 8.4* 8.8*  More results in Results Review 9.3*  More results in Results Review 8.2*   Hematocrit 28.2* 27.2* 28.4*  More results in Results Review 30.5*  More results in Results Review 26.4*   MCV 99.3*  --   --   --  99.3*  --  101.9*   Platelets 529*  --   --   --  481*  --  464*   More results in Results Review = values in this interval not displayed.         Recent Labs   Lab 07/28/22  0908 07/27/22  0455 07/26/22  0256 07/24/22  0638 07/23/22  1823   Sodium 136 138 142 143 143   Potassium 4.5 4.1 3.4* 3.4* 3.7   Chloride 107 109 111 109 108   CO2 '23 21 24 24 24   '$ BUN 11.0 10.0 10.0 13.0 12.0   Creatinine 0.7 0.8 0.7 0.7 0.6   Glucose 189* 243* 174* 175* 220*   Calcium 8.5 8.3 7.9 8.6 8.5   Magnesium 1.6 1.8 1.0*  --   --          Recent Labs   Lab 07/27/22  0455 07/24/22  0638 07/23/22  1823   ALT '6 7 11   '$ AST (SGOT) '10 12 16   '$ Bilirubin, Total 0.3 0.8 0.8   Albumin 1.5* 1.7* 1.9*   Alkaline Phosphatase 53 69 76         Recent  Labs   Lab 07/24/22  0638 07/24/22  0026 07/23/22  2218   Creatine Kinase (CK)  --   --  43   hs Troponin-I 103.4* 108.5*  --    hs Troponin-I Delta  --  calc n/a  --          Recent Labs   Lab 07/26/22  1600 07/24/22  0117 07/23/22  2218   PT INR  --  1.6* 1.5*   PT  --  19.0* 17.3*   PTT 62* 32 24*         Microbiology Results (last 15 days)  Procedure Component Value Units Date/Time    MRSA culture [id number] Collected: 07/24/22 1932    Order Status: Completed Specimen: Culturette from Nasal Swab Updated: 07/26/22 0214     Culture MRSA Surveillance Negative for Methicillin Resistant Staph aureus    MRSA culture [id number] Collected: 07/24/22 1932    Order Status: Completed Specimen: Culturette from Throat Updated: 07/26/22 0214     Culture MRSA Surveillance Negative for Methicillin Resistant Staph aureus    Urine culture [id number] Collected: 07/24/22 1932    Order Status: Completed Specimen: Bladder Updated: 07/26/22 0413    Narrative:      ORDER#: [id number]                                    ORDERED BY: Enis Gash  SOURCE: Urine                                        COLLECTED:  07/24/22 19:32  ANTIBIOTICS AT COLL.:                                RECEIVED :  07/24/22 20:10  Culture Urine                              FINAL       07/26/22 04:13  07/26/22   No growth of >1,000 CFU/ML, No further work      Culture Blood Aerobic and Anaerobic [id number] Collected: 07/23/22 2218    Order Status: Completed Specimen: Blood, Venipuncture Updated: 07/28/22 0221    Narrative:      The order will result in two separate 8-77m bottles  Please do NOT order repeat blood cultures if one has been  drawn within the last 48 hours  UNLESS concerned for  endocarditis  AVOID BLOOD CULTURE DRAWS FROM CENTRAL LINE IF POSSIBLE  Indications:->Sepsis  ORDER#: [id number]                                   ORDERED BY: BEnis Gash SOURCE: Blood, Venipuncture                          COLLECTED:  07/23/22 22:18  ANTIBIOTICS AT  COLL.:                                RECEIVED :  07/24/22 01:42  Culture Blood Aerobic and Anaerobic        PRELIM      07/28/22 02:21  07/25/22   No Growth after 1 day/s of incubation.  07/26/22   No Growth after 2 day/s of incubation.  07/27/22   No Growth after 3 day/s of incubation.  07/28/22   No Growth after 4 day/s of incubation.      Culture Blood Aerobic and Anaerobic [[id number]: 07/23/22 2218    Order Status: Completed Specimen: Blood, Venipuncture Updated: 07/28/22 0221    Narrative:      The order will result in two separate 8-119mbottles  Please do NOT order repeat blood cultures if  one has been  drawn within the last 48 hours  UNLESS concerned for  endocarditis  AVOID BLOOD CULTURE DRAWS FROM CENTRAL LINE IF POSSIBLE  Indications:->Sepsis  ORDER#: [id number]                                    ORDERED BY: Enis Gash  SOURCE: Blood, Venipuncture                          COLLECTED:  07/23/22 22:18  ANTIBIOTICS AT COLL.:                                RECEIVED :  07/24/22 01:42  Culture Blood Aerobic and Anaerobic        PRELIM      07/28/22 02:21  07/25/22   No Growth after 1 day/s of incubation.  07/26/22   No Growth after 2 day/s of incubation.  07/27/22   No Growth after 3 day/s of incubation.  07/28/22   No Growth after 4 day/s of incubation.      COVID-19 (SARS-CoV-2) and Influenza A/B, NAA (Liat Rapid) [id number] Collected: 07/23/22 2218    Order Status: Completed Specimen: Culturette from Nasopharyngeal Updated: 07/23/22 2259     Purpose of COVID testing Diagnostic -[facility]     SARS-CoV-2 Specimen Source Nasal Swab     SARS CoV 2 Overall Result Not Detected     Comment: __________________________________________________  -A result of "Detected" indicates POSITIVE for the    presence of SARS CoV-2 RNA  -A result of "Not Detected" indicates NEGATIVE for the    presence of SARS CoV-2 RNA  __________________________________________________________  Test performed using the Roche cobas Liat  SARS-CoV-2 assay. This assay is  only for use under the Food and Drug Administration's Emergency Use  Authorization. This is a real-time RT-PCR assay for the qualitative  detection of SARS-CoV-2 RNA. Viral nucleic acids may persist in vivo,  independent of viability. Detection of viral nucleic acid does not imply the  presence of infectious virus, or that virus nucleic acid is the cause of  clinical symptoms. Negative results do not preclude SARS-CoV-2 infection and  should not be used as the sole basis for diagnosis, treatment or other  patient management decisions. Negative results must be combined with  clinical observations, patient history, and/or epidemiological information.  Invalid results may be due to inhibiting substances in the specimen and  recollection should occur. Please see Fact Sheets for patients and providers  located:  https://www.benson-chung.com/          Influenza A Not Detected     Influenza B Not Detected     Comment: Test performed using the Roche cobas Liat SARS-CoV-2 & Influenza A/B assay.  This assay is only for use under the Food and Drug Administration's  Emergency Use Authorization. This is a multiplex real-time RT-PCR assay  intended for the simultaneous in vitro qualitative detection and  differentiation of SARS-CoV-2, influenza A, and influenza B virus RNA. Viral  nucleic acids may persist in vivo, independent of viability. Detection of  viral nucleic acid does not imply the presence of infectious virus, or that  virus nucleic acid is the cause of clinical symptoms. Negative results do  not preclude SARS-CoV-2, influenza A, and/or influenza B infection and  should not be used as the  sole basis for diagnosis, treatment or other  patient management decisions. Negative results must be combined with  clinical observations, patient history, and/or epidemiological information.  Invalid results may be due to inhibiting substances in the specimen and  recollection should  occur. Please see Fact Sheets for patients and providers  located: http://olson-hall.info/.         Narrative:      o Collect and clearly label specimen type:  o PREFERRED-Upper respiratory specimen: One Nasal Swab in  Transport Media.  o Hand deliver to laboratory ASAP  Diagnostic -[facility]             RADIOLOGY     CT Angiogram Chest    Result Date: 07/27/2022  1. No evidence of pulmonary embolism. 2. Stable small left pleural effusion. 3. Stable small pericardial effusion. 4. Findings which may be associated with pulmonary hypertension. Legrand Como [facility] 07/27/2022 12:39 AM    XR Hip left 2-3 vw with pelvis    Result Date: 07/25/2022    Mild to moderate bilateral hip degenerative changes. Moderate rectal stool burden. Tama Headings, MD 07/25/2022 12:43 PM    US Venous Low Extrem Duplex Elnora Bilat    Result Date: 07/24/2022   Left femoral-popliteal DVT extending to the left peroneal veins and posterior tibial veins, with associated mild soft tissue swelling/edema. Short segment nonocclusive thrombus within the right common femoral vein. Oley Balm, MD 07/24/2022 5:18 PM    CT Abd/Pelvis without Contrast    Result Date: 07/24/2022   1. Nonobstructing left renal stones. 2. Small bladder stones. 3. Additional incidental findings as described above. Edison Simon, MD 07/24/2022 1:43 AM    CT Chest without Contrast    Result Date: 07/24/2022  Small left pleural effusion, with minimal bibasilar areas of atelectasis. No focal consolidation. Slightly patulous and fluid-filled esophagus can be seen in the setting of gastroesophageal reflux. 2.6 x 2.0 x 3.5 cm low-attenuation lesion within the left thyroid lobe. Correlation with ultrasound is recommended for follow-up. Enlarged main pulmonary trunk, suggestive of underlying pulmonary arterial hypertension. Status post cholecystectomy. Nonobstructing left renal calculus. Timmie Foerster MD, MD 07/24/2022 1:40 AM    X-ray chest AP  portable    Result Date: 07/23/2022   1. Small left pleural effusion. Dense left basilar airspace opacity suspicious for pneumonia. 2. Air-filled structure projecting over the lower mediastinum suggestive of hiatal hernia. Air also noted distending the esophagus. If indicated, further assessment can be made with CT. Denice Paradise, MD 07/23/2022 10:42 PM   Echo Results       Procedure Component Value Units Date/Time    Echo Adult TTE Complete [id number] Resulted: 07/26/22 1305     Updated: 07/26/22 1305          No results found for this or any previous visit.    Signed,  Arley Phenix, MD  2:46 PM 07/28/2022

## 2022-07-28 NOTE — Progress Notes (Signed)
Nutritional Support Services  Nutrition Follow-up    Meredith Baker 76 y.o. female   MRN: [id number]    Summary of Nutrition Recommendations:    Continue consistent carbohydrate diet.   Continue Ensure Plus HP 1 bottle PO TID to optimize nutritional intake.   Each Ensure Plus High Protein provides 350 kcal and 20 gm protein.  Daily weights.   Consider adjusting sliding scale d/t high BG.  Messaged MD.    -----------------------------------------------------------------------------------------------------------------  RN unavailable                                                       ASSESSMENT DATA     Subjective Nutrition: Patient seen at bedside, lethargic. Pt reports decreased appetite, states she is forcing herself to eat. Pt states she was very worried "when my niece told me I was malnourished". Pt states she has been increasing her meal, supplement, and fluid intake. Pt states she has been drinking Ensure TID. Pt likely meeting calorie needs if drinking Ensure TID. Pt denies n/v/c/d, denies chew/swallow difficulties. LBM 11/29 per flowsheets.     Learning Needs: Encouraged pt to continue optimizing nutritional intake via meals and supplements.     Events of Current Admission: 76 year old female with history of hypertension, type 2 diabetes, hyperlipidemia, osteoarthritis, bedbound, history of bilateral DVT who presented for evaluation of generalized weakness and inability to care for herself admitted with a diagnosis of UTI and NSTEMI.     Medical Hx:  has a past medical history of Diabetes mellitus, History of blood clots, and Hypertension.       Orders Placed This Encounter   Procedures    Diet consistent carbohydrate    Ensure Plus High Protein Supplement Quantity: A. One; Flavor: Vanilla; Frequency: TID (3 times a day) with meals     Intake:  11/30: 25% x1  11/27: 50% x2, 75% x1  11/26: 25% x2, 50% x1     ANTHROPOMETRIC  Height: 157.5 cm ('5\' 2"'$ )  Weight: 109.6 kg (241 lb 9.6 oz)  Weight Change: 3.68  Body  mass index is 44.19 kg/m.      Weight Monitoring     Weight Weight Method   07/23/2022 100.336 kg  Bed Scale    07/24/2022 102.9 kg  Bed Scale     102.9 kg  Bed Scale     103.103 kg     07/27/2022 105.7 kg  Bed Scale    07/28/2022 109.589 kg  Bed Scale      Weight History Summary: Per chart, 9.2 kg weight gain in 5 days, unsure of accuracy of weight methods.     ESTIMATED NEEDS    Total Daily Energy Needs: 1546.5 to 2062 kcal  Method for Calculating Energy Needs: 15 kcal - 20 kcal per kg  at 103.1 kg (Actual body weight)  Rationale: obese, noncritical       Total Daily Protein Needs: 78.3 to 104.4 g  Method for Calculating Protein Needs: 1.5 g - 2 g per kg at 52.2 kg (Ideal body weight)  Rationale: obese, noncritical      Total Daily Fluid Needs: 1148.4 to 1409.4 ml  Method for Calculating Fluid Needs: 22 ml - 27 ml  per kg at 52.2 kg (Ideal body weight)  Rationale: bmi, age, or per md    Pertinent  Medications: lipitor, ceftriaxone, SSI, protonix, pericolace  IVF:      Pertinent labs:  Recent Labs   Lab 07/28/22  0908 07/28/22  0439 07/27/22  1516 07/27/22  0455 07/26/22  2238 07/26/22  1600 07/26/22  0256 07/24/22  0638 07/23/22  1823 07/23/22  1648   Sodium 136  --   --  138  --   --  142 143 143  --    Potassium 4.5  --   --  4.1  --   --  3.4* 3.4* 3.7  --    Chloride 107  --   --  109  --   --  111 109 108  --    CO2 23  --   --  21  --   --  '24 24 24  '$ --    BUN 11.0  --   --  10.0  --   --  10.0 13.0 12.0  --    Creatinine 0.7  --   --  0.8  --   --  0.7 0.7 0.6  --    Glucose 189*  --   --  243*  --   --  174* 175* 220*  --    Calcium 8.5  --   --  8.3  --   --  7.9 8.6 8.5  --    Magnesium 1.6  --   --  1.8  --   --  1.0*  --   --   --    Phosphorus 2.4  --   --  2.4  --   --   --   --   --   --    eGFR >60.0  --   --  >60.0  --   --  >60.0 >60.0 >60.0  --    WBC 9.56*  --   --   --  8.99  --  8.75 11.91*  --  11.16*   Hematocrit 28.2* 27.2* 28.4* 26.5* 30.5*  More results in Results Review 26.4* 33.2*  --   38.5   Hgb 8.8* 8.4* 8.8* 8.3* 9.3*  More results in Results Review 8.2* 10.2*  --  12.1   Triglycerides  --   --   --   --   --   --  122  --   --   --    Hemoglobin A1C  --   --   --   --   --   --  6.0*  --   --   --    More results in Results Review = values in this interval not displayed.                                                      MONITORING/EVALUATION     Goals:      1. Patient will consume >/=75% of nutritional needs via meals by next RD follow up - active    Nutrition Risk Level: High (will follow up at least 2 times per week and PRN)     Scot Jun  [place] Dietetic Intern  [phone]

## 2022-07-28 NOTE — Nursing Progress Note (Signed)
NURSING SHIFT NOTE     Patient: Meredith Baker  Day: 4      SHIFT EVENTS     Shift Narrative/Significant Events (PRN med administration, fall, RRT, etc.):   Uneventful shifht. Alert and oriented. VS stable. Tried to work with PT/Ot but felt dizzy when up.    Safety and fall precautions remain in place. Purposeful rounding completed.          ASSESSMENT     Changes in assessment from patient's baseline this shift:    Neuro: No  CV: No  Pulm: No  Peripheral Vascular: No  HEENT: No  GI: No  BM during shift: yes, Last BM: 07/28/22  GU: No   Integ: No  MS: No    Pain: None  Pain Interventions:   Medications Utilized:     Mobility: PMP Activity: Step 4 - Dangle at Bedside of Distance Walked (ft) (Step 6,7): 0 Feet           Lines     Patient Lines/Drains/Airways Status       Active Lines, Drains and Airways       Name Placement date Placement time Site Days    Peripheral IV 07/24/22 20 G Anterior;Left Forearm 07/24/22  0030  Forearm  4    External Urinary Catheter 07/23/22  1602  --  4                         VITAL SIGNS     Vitals:    07/28/22 1323   BP: 130/72   Pulse: 94   Resp: 18   Temp: 97.9 F (36.6 C)   SpO2: 98%       Temp  Min: 97.7 F (36.5 C)  Max: 98.2 F (36.8 C)  Pulse  Min: 76  Max: 101  Resp  Min: 16  Max: 19  BP  Min: 110/64  Max: 171/87  SpO2  Min: 88 %  Max: 99 %      Intake/Output Summary (Last 24 hours) at 07/28/2022 1524  Last data filed at 07/28/2022 0908  Gross per 24 hour   Intake 200 ml   Output 1000 ml   Net -800 ml

## 2022-07-28 NOTE — Plan of Care (Signed)
Problem: Pain interferes with ability to perform ADL  Goal: Pain at adequate level as identified by patient  Outcome: Progressing  Flowsheets (Taken 07/27/2022 1952 by Brandon Melnick, RN)  Pain at adequate level as identified by patient:   Identify patient comfort function goal   Assess for risk of opioid induced respiratory depression, including snoring/sleep apnea. Alert healthcare team of risk factors identified.   Reassess pain within 30-60 minutes of any procedure/intervention, per Pain Assessment, Intervention, Reassessment (AIR) Cycle   Assess pain on admission, during daily assessment and/or before any "as needed" intervention(s)     Problem: Side Effects from Pain Analgesia  Goal: Patient will experience minimal side effects of analgesic therapy  Outcome: Progressing  Flowsheets (Taken 07/24/2022 0536 by Azell Der, RN)  Patient will experience minimal side effects of analgesic therapy:   Monitor/assess patient's respiratory status (RR depth, effort, breath sounds)   Prevent/manage side effects per LIP orders (i.e. nausea, vomiting, pruritus, constipation, urinary retention, etc.)   Assess for changes in cognitive function   Evaluate for opioid-induced sedation with appropriate assessment tool (i.e. POSS)     Problem: Moderate/High Fall Risk Score >5  Goal: Patient will remain free of falls  Outcome: Progressing  Flowsheets (Taken 07/28/2022 0900)  High (Greater than 13):   LOW-Fall Interventions Appropriate for Low Fall Risk   MOD-Use of chair-pad alarm when appropriate   MOD-Consider a move closer to [employer]   MOD-Remain with patient during toileting   MOD-Use of assistive devices -Bedside Commode if appropriate   MOD-Re-orient confused patients   MOD-Utilize diversion activities   MOD-Perform dangle, stand, walk (DSW) prior to mobilization   MOD-Request PT/OT consult order for patients with gait/mobility impairment   MOD-Include family in multidisciplinary POC discussions   HIGH-Visual cue  at entrance to patient's room   HIGH-Bed alarm on at all times while patient in bed   MOD-Place Fall Risk level on whiteboard in room   HIGH-Utilize chair pad alarm for patient while in the chair   HIGH-Apply yellow "Fall Risk" arm band   HIGH-Pharmacy to initiate evaluation and intervention per protocol   HIGH-Consider use of low bed   HIGH-Initiate use of floor mats as appropriate     Problem: Compromised Tissue integrity  Goal: Damaged tissue is healing and protected  Outcome: Progressing  Flowsheets (Taken 07/28/2022 0800)  Damaged tissue is healing and protected:   Monitor/assess Braden scale every shift   Provide wound care per wound care algorithm   Reposition patient every 2 hours and as needed unless able to reposition self   Increase activity as tolerated/progressive mobility   Relieve pressure to bony prominences for patients at moderate and high risk   Avoid shearing injuries   Keep intact skin clean and dry   Use bath wipes, not soap and water, for daily bathing   Monitor external devices/tubes for correct placement to prevent pressure, friction and shearing   Encourage use of lotion/moisturizer on skin   Monitor patient's hygiene practices     Problem: Safety  Goal: Patient will be free from injury during hospitalization  Outcome: Progressing  Flowsheets (Taken 07/27/2022 2059 by Elodia Florence, RN)  Patient will be free from injury during hospitalization:   Assess patient's risk for falls and implement fall prevention plan of care per policy   Provide and maintain safe environment   Hourly rounding     Problem: Everyday - Pneumonia  Goal: Stable Vital Signs and Fluid Balance  Outcome: Progressing  Flowsheets (  Taken 07/25/2022 2041 by Horace Porteous, RN)  Stable Vital Signs and Fluid Balance:   Monitor/ assess vital signs and telemetry per policy   Monitor labs and report abnormalities to physicians   Oxygen as needed- wean per guideline([facility] only)  Goal: Mobility/Activity is Maintained at Optimal Level  for Patient  Outcome: Progressing  Flowsheets (Taken 07/25/2022 0141 by Azell Der, RN)  Mobility/Activity is Maintained at Optimal Level for Patient:   Increase mobility as tolerated using Dangle/Stand/Walk and progressive mobility   Reposition patient every 2 hours and as needed unless able to reposition self   Perform active/passive ROM     Problem: Hemodynamic Status: Cardiac  Goal: Stable vital signs and fluid balance  Outcome: Progressing  Flowsheets (Taken 07/26/2022 2107 by Elodia Florence, RN)  Stable vital signs and fluid balance:   Assess signs and symptoms associated with cardiac rhythm changes   Monitor lab values

## 2022-07-28 NOTE — UM Notes (Signed)
CONTINUED STAY REVIEW,   76 years old female with history of hypertension, type 2 diabetes, hyperlipidemia, osteoarthritis, bedbound, history of bilateral DVT who presented for evaluation of generalized weakness presented for weakness and inability to care for herself admitted with a diagnosis of UTI and NSTEMI cardiology on board     24 Hour Events:Positive for stool occult test this shift. Hgb 8.4  this morning.     Temp:  [97.7 F (36.5 C)-98.2 F (36.8 C)] 97.9 F (36.6 C)  Heart Rate:  [76-101] 94  Resp Rate:  [16-19] 18  BP: (110-171)/(64-87) 130/72     Scheduled Meds:  Current Facility-Administered Medications   Medication Dose Route Frequency    aspirin  81 mg Oral Daily    atorvastatin  40 mg Oral QHS    balsam peru-castor oil (VENELEX)   Topical Q12H    carvedilol  12.5 mg Oral Q12H SCH    dabigatran  150 mg Oral Q12H [place]    gabapentin  200 mg Oral Q8H [place]    insulin lispro  1-3 Units Subcutaneous QHS    insulin lispro  1-5 Units Subcutaneous TID AC    losartan (COZAAR) 100 mg, hydroCHLOROthiazide (HYDRODIURIL) 25 mg for HYZAAR   Oral Daily    miconazole 2 % with zinc oxide   Topical Q12H    pantoprazole  40 mg Oral QAM AC    polyethylene glycol  17 g Oral Daily    senna-docusate  1 tablet Oral QHS     Continuous Infusions:  PRN Meds:.acetaminophen, benzocaine-menthol, benzonatate, artificial tears (REFRESH PLUS), dextrose **OR** dextrose **OR** dextrose **OR** glucagon (rDNA), magnesium sulfate, melatonin, naloxone, potassium & sodium phosphates, potassium chloride **AND** potassium chloride, saline     Abnormal Labs   Latest Reference Range & Units 07/28/22 04:39 07/28/22 07:59 07/28/22 09:08   WBC 3.10 - 9.50 x10 3/uL   9.56 (H)   Hemoglobin 11.4 - 14.8 g/dL 8.4 (L)  8.8 (L)   Hematocrit 34.7 - 43.7 % 27.2 (L)  28.2 (L)   Platelet Count 142 - 346 x10 3/uL   529 (H)   RBC 3.90 - 5.10 x10 6/uL   2.84 (L)   MCV 78.0 - 96.0 fL   99.3 (H)   MCHC 31.5 - 35.8 g/dL   31.2 (L)   RDW 11 - 15 %   19 (H)    Instrument Absolute Neutrophil Count 1.10 - 6.33 x10 3/uL   6.35 (H)   Immature Granulocytes Absolute 0.00 - 0.07 x10 3/uL   0.20 (H)   Neutrophils Absolute 1.10 - 6.33 x10 3/uL   6.35 (H)   Glucose 70 - 100 mg/dL   189 (H)   Whole Blood Glucose POCT 70 - 100 mg/dL  182 (H)    (H): Data is abnormally high  (L): Data is abnormally low        Plan of Care  NSTEMI (non-ST elevated myocardial infarction) (07/24/2022)           Assessment:   Sepsis likely from UTI  Presented with leukocytosis and tachycardia  CT scan of the chest showed small left pleural effusion and evidence of pulmonary hypertension  UA consistent with UTI  Blood culture and urine culture came negative  Initially started on empirical broad-spectrum antibiotic vancomycin and Zosyn  MRSA test is negative  Antibiotic de-escalated to ceftriaxone  Leukocytosis improved  11/29: Patient will complete 5 days course of antibiotics today        Anemia  Hemoglobin  has been dropping from  From 12-10--8.2  I have ordered H&H monitor every 6 hours  Fecal occult blood test  Given patient has history of DVT I will continue the anticoagulation for now  Fecal occult blood test  Will monitor closely  No obvious bleeding noted  11/29: So far H&H stable between 8-9, continue heparin drip, pending hematology evaluation) final recommendation, fecal occult blood test pending, patient had 1 bowel movement yesterday which is brown, no obvious bleeding noted     History of DVT  Patient had bilateral lower extremity DVT back in September 2023 currently on Pradaxa  Repeat venous ultrasound showed left femoral popliteal DVT extending to the left peroneal vein and posterior tibialis vein with associated mild soft tissue swelling/edema and short segment nonocclusive thrombus within the right common femoral vein  I will involve hematology to guide on treatment  11/29: CTA ruled out PE, for now we will continue heparin drip, will switch her back to DOAC once hematology clears      NSTEMI  Elevated troponin  Likely demand ischemia  Troponin trended up from 90-108  Patient is on aspirin, statin and Pradaxa  Cardiology on board, recommended to get CTA  Follow-up recommendation  11/29: 2D echo showed normal EF, no LV wall motion abnormality, elevated troponin is likely from demand ischemia and anemia, continue aspirin/statin/Coreg/losartan.    UTILIZATION REVIEW CONTACT: Marica Otter  RN, BSN  Utilization Review   [facility]  [address] [place]  [place], [place] [address]  Phone: [phone] (Voice Mail Only)  Email: [email or web address]  Tax IDVM:[id number]         NOTES TO REVIEWER:    This clinical review is based on/compiled from documentation provided by the treatment team within the patient's medical record.

## 2022-07-28 NOTE — OT Progress Note (Signed)
Occupational Therapy Treatment  [facility] Therapy Recommendations   Discharge Recommendations:  SNF    DME needs IF patient is discharging home: No additional equipment/DME recommended at this time    Therapy discharge recommendations may change with patient status.  Please refer to most recent note for up-to-date recommendations.    Unit: [address] [facility]  Bed: A2502/A2502-B    ___________________________________________________    Time of treatment:  OT Received On: 07/28/22  Start Time: [id number]  Stop Time: 1007  Time Calculation (min): 24 min  Total Treatment Time (min):  (12 min; co-tx with PT to maximize therapeutic benefit)    Chart Review and Collaboration with Care Team: 4 minutes, not included in above time.    OT Visit Number: 2      Precautions and Contraindications:    Precautions  Weight Bearing Status: no restrictions  Restricted BP Precautions: no R UE  Fall Risks: High;Impaired mobility;Muscle weakness  Other Precautions: Bleeding    Personal Protective Equipment (PPE)  gloves and procedure mask    Updated Labs:  Lab Results   Component Value Date/Time    HGB 8.8 (L) 07/28/2022 09:08 AM    HCT 28.2 (L) 07/28/2022 09:08 AM    K 4.5 07/28/2022 09:08 AM    NA 136 07/28/2022 09:08 AM    INR 1.6 (H) 07/24/2022 01:17 AM    TROPI 103.4 (AA) 07/24/2022 06:38 AM    TROPI 108.5 (AA) 07/24/2022 12:26 AM    TROPI calc n/a 07/24/2022 12:26 AM    TROPI 92.0 (AA) 07/23/2022 04:48 PM       All imaging reviewed, please see chart for details.    Subjective:    .       Patient's medical condition is appropriate for Occupational Therapy intervention at this time.  Patient is agreeable to participation in the therapy session.  Pain Assessment  Pain Assessment: No/denies pain    Objective:  Observation of Patient/Vital Signs:  BP 123/75   Pulse 94 Ht 1.575 m ('5\' 2"'$ )   SpO2 97%    BP: 100/64 at end of session      Cognition/Neuro Status  Arousal/Alertness: Appropriate responses to  stimuli  Attention Span: Appears intact  Orientation Level: Oriented X4  Memory: Appears intact  Following Commands: Follows all commands and directions without difficulty  Safety Awareness: independent  Insights: Fully aware of deficits  Problem Solving: minimal assistance  Behavior: calm;cooperative  Motor Planning: decreased initiation;decreased processing speed  Coordination: intact  Hand Dominance: right handed    Functional Mobility  Supine to Sit Transfers: Maximal Assist  Sit to Supine Transfers: Maximal Assist    Self-care and Home Management  Eating: Independent;in bed  LB Dressing: Dependent;Don/doff L sock;Don/doff R sock  Toileting: Dependent (external cath)    Therapeutic Exercises  Shoulder AROM: Flexion;Extension (x 5 reps)  Elbow AROM: Flexion;Extension (x 5 reps)    Neuro Re-Ed  Balance Training: Sitting reaching activities         Educated the Patient to role of occupational therapy, plan of care, goals of therapy and safety with mobility and ADLs with verbalized understanding .     Patient left in bed with alarm and all other medical equipment in place and call bell and all personal items/needs within reach.  RN notified of session outcome.  CM team and attending have been previously notified of d/c recommendation; no updates to d/c recommendation since that time.  Assessment:  Pt is progressing toward ADL and mobility goals. She was able to participate in functional reaching activities while seated EOB. Pt reported dizziness, increased lethargy noted, and was returned to supine. BP stable. Pt pleasant and motivated to participate throughout treatment. Pt present with significant deficits in strength and mobility. SNF remains d/c rec.        PMP Activity: Step 4 - Dangle at Bedside  Distance Walked (ft) (Step 6,7): 0 Feet      Plan:  OT Frequency Recommended: 2-3x/wk  Goal Formulation: Patient      Time For Goal Achievement: by time of discharge  ADL Goals  Patient will feed self:  Independent, Goal met  Patient will groom self: Independent, Not met  Mobility and Transfer Goals  Other Goal: Patient will transitioned supine <> EOB c Min A; not met                       Continue plan of care.      [name], OTR/L    Physical Medicine and Rehabilitation  [facility]  [phone]    07/28/2022  10:21 AM    [facility]  Patient: Meredith Baker MRN#: [id number]   Unit: [facility] Bed: A2502/A2502-B

## 2022-07-28 NOTE — Plan of Care (Signed)
NURSING SHIFT NOTE     Patient: Meredith Baker  Day: 4      SHIFT EVENTS     Shift Narrative/Significant Events (PRN med administration, fall, RRT, etc.):       Patient is generally stable. No acute change in status overnight. No obvious GI bleed. Hgb 8.5 this morning. All scheduled meds administered as ordered.   Will continue to monitor.  Safety and fall precautions remain in place. Purposeful rounding completed.              CARE PLAN        Problem: Diabetes: Glucose Imbalance  Goal: Blood glucose stable at established goal  Flowsheets (Taken 07/28/2022 2011)  Blood glucose stable at established goal:   Monitor lab values   Assess for hypoglycemia /hyperglycemia   Ensure appropriate consults are obtained (Nutrition, Diabetes Education, and Case Management)     Problem: Compromised Tissue integrity  Goal: Damaged tissue is healing and protected  Outcome: Progressing  Flowsheets (Taken 07/28/2022 2009)  Damaged tissue is healing and protected:   Monitor/assess Braden scale every shift   Provide wound care per wound care algorithm     Problem: Safety  Goal: Patient will be free from injury during hospitalization  Outcome: Progressing  Flowsheets (Taken 07/27/2022 2059)  Patient will be free from injury during hospitalization:   Assess patient's risk for falls and implement fall prevention plan of care per policy   Provide and maintain safe environment   Hourly rounding     Problem: Hemodynamic Status: Cardiac  Goal: Stable vital signs and fluid balance  Outcome: Progressing  Flowsheets (Taken 07/26/2022 2107)  Stable vital signs and fluid balance:   Assess signs and symptoms associated with cardiac rhythm changes   Monitor lab values

## 2022-07-29 LAB — CBC AND DIFFERENTIAL
Absolute NRBC: 0 10*3/uL (ref 0.00–0.00)
Basophils Absolute Automated: 0.06 10*3/uL (ref 0.00–0.08)
Basophils Automated: 0.7 %
Eosinophils Absolute Automated: 0.29 10*3/uL (ref 0.00–0.44)
Eosinophils Automated: 3.4 %
Hematocrit: 26.8 % — ABNORMAL LOW (ref 34.7–43.7)
Hgb: 8.5 g/dL — ABNORMAL LOW (ref 11.4–14.8)
Immature Granulocytes Absolute: 0.16 10*3/uL — ABNORMAL HIGH (ref 0.00–0.07)
Immature Granulocytes: 1.9 %
Instrument Absolute Neutrophil Count: 5.69 10*3/uL (ref 1.10–6.33)
Lymphocytes Absolute Automated: 1.6 10*3/uL (ref 0.42–3.22)
Lymphocytes Automated: 18.5 %
MCH: 31.1 pg (ref 25.1–33.5)
MCHC: 31.7 g/dL (ref 31.5–35.8)
MCV: 98.2 fL — ABNORMAL HIGH (ref 78.0–96.0)
MPV: 9.1 fL (ref 8.9–12.5)
Monocytes Absolute Automated: 0.84 10*3/uL (ref 0.21–0.85)
Monocytes: 9.7 %
Neutrophils Absolute: 5.69 10*3/uL (ref 1.10–6.33)
Neutrophils: 65.8 %
Nucleated RBC: 0 /100 WBC (ref 0.0–0.0)
Platelets: 481 10*3/uL — ABNORMAL HIGH (ref 142–346)
RBC: 2.73 10*6/uL — ABNORMAL LOW (ref 3.90–5.10)
RDW: 19 % — ABNORMAL HIGH (ref 11–15)
WBC: 8.64 10*3/uL (ref 3.10–9.50)

## 2022-07-29 LAB — BASIC METABOLIC PANEL
Anion Gap: 5 (ref 5.0–15.0)
BUN: 13 mg/dL (ref 7.0–21.0)
CO2: 26 mEq/L (ref 17–29)
Calcium: 8.7 mg/dL (ref 7.9–10.2)
Chloride: 104 mEq/L (ref 99–111)
Creatinine: 0.7 mg/dL (ref 0.4–1.0)
Glucose: 284 mg/dL — ABNORMAL HIGH (ref 70–100)
Potassium: 4.4 mEq/L (ref 3.5–5.3)
Sodium: 135 mEq/L (ref 135–145)
eGFR: >60 mL/min/{1.73_m2} (ref >=60)

## 2022-07-29 LAB — GLUCOSE WHOLE BLOOD - POCT
Whole Blood Glucose POCT: 209 mg/dL — ABNORMAL HIGH (ref 70–100)
Whole Blood Glucose POCT: 219 mg/dL — ABNORMAL HIGH (ref 70–100)
Whole Blood Glucose POCT: 179 mg/dL — ABNORMAL HIGH (ref 70–100)
Whole Blood Glucose POCT: 182 mg/dL — ABNORMAL HIGH (ref 70–100)

## 2022-07-29 LAB — ANTI-XA,UFH: Anti-Xa, UFH: 0.05 IU/mL

## 2022-07-29 MED ORDER — ATORVASTATIN CALCIUM 40 MG PO TABS
40.0000 mg | ORAL_TABLET | Freq: Every evening | ORAL | 0 refills | Status: AC
Start: 2022-07-29 — End: ?

## 2022-07-29 MED ORDER — CARBOXYMETHYLCELLULOSE SODIUM 0.5 % OP SOLN
1.0000 [drp] | Freq: Three times a day (TID) | OPHTHALMIC | 0 refills | Status: AC | PRN
Start: 2022-07-29 — End: ?

## 2022-07-29 MED ORDER — PANTOPRAZOLE SODIUM 40 MG PO TBEC
DELAYED_RELEASE_TABLET | ORAL | 0 refills | Status: AC
Start: 2022-07-30 — End: 2022-09-28

## 2022-07-29 MED ORDER — INSULIN GLARGINE 100 UNIT/ML SC SOLN
8.0000 [IU] | Freq: Every evening | SUBCUTANEOUS | Status: DC
Start: 2022-07-29 — End: 2022-08-02
  Administered 2022-07-29 – 2022-08-01 (×4): 8 [IU] via SUBCUTANEOUS
  Filled 2022-07-29 (×4): qty 8

## 2022-07-29 MED ORDER — ONDANSETRON HCL 4 MG/2ML IJ SOLN
4.0000 mg | Freq: Three times a day (TID) | INTRAMUSCULAR | Status: DC | PRN
Start: 2022-07-29 — End: 2022-08-15
  Administered 2022-07-29: 4 mg via INTRAVENOUS
  Filled 2022-07-29: qty 2

## 2022-07-29 MED ORDER — ASPIRIN 81 MG PO CHEW
81.0000 mg | CHEWABLE_TABLET | Freq: Every day | ORAL | 0 refills | Status: AC
Start: 2022-07-30 — End: 2022-08-29

## 2022-07-29 MED ORDER — INSULIN GLARGINE 100 UNIT/ML SC SOLN
10.0000 [IU] | Freq: Every evening | SUBCUTANEOUS | Status: DC
Start: 2022-07-29 — End: 2022-08-02

## 2022-07-29 NOTE — Discharge Instr - AVS First Page (Addendum)
[facility] DISCHARGE INSTRUCTIONS     Date of Admission: 07/23/2022    Date of Discharge: 07/29/2022    Discharge Physician: Arley Phenix, MD    Thank you for choosing the [facility]  for your healthcare needs. It was a privilege caring for you. I am genuinely concerned about your health and comfort.      Below, please find detailed instructions regarding your medical care.     You were admitted for NSTEMI (non-ST elevated myocardial infarction). It is important that you make your follow up appointments listed below.  Bring these discharge papers to your follow-up visit with your medical provider.   I cannot stress the importance of follow up enough.      Consultants: cardiology, hematology and GI    Pending Studies: outpatient EGD and colonoscopy     Discharge Diet: heart Healthy     Surgery/Procedure: none     Further Instructions:  please follow up with gi and hematology     Follow up Appointments:   Follow-up Information       West Carbo, MD Follow up in 1 week(s).    Specialties: Gastroenterology, Internal Medicine  Contact information:  [place] [address]  [phone]               Tia Alert, MD Follow up in 1 week(s).    Specialties: Medical Oncology, Hematology  Contact information:  [address]  [place] [address]  [phone]               Vonzell Schlatter, MD Follow up in 1 week(s).    Specialties: Interventional Cardiology, Internal Medicine, Cardiology  Contact information:  [address] [place] [address]  [phone]                                 Please take your medications as prescribed. As we discussed, please review the attached handouts for more information about important side effects of your medication.    If you have any fever, chest pain, palpitations, shortness of breath or difficulty breathing, worsening nausea and vomiting, or lower leg pain please seek medical attention immediately.     Finally, below  please find the results of imaging studies that you had in the hospital. Please review this with your primary care doctor.    You will likely be receiving a survey about the care you received in our hospital. It would mean a lot to me and all of the care team if you could fill that out and return the form. We're always looking to improve and your feedback will help [place] do that.     I wish you good health. Please do not hesitate to contact me if I can be of assistance. I aim to provide excellent care.     Respectfully Yours,    Arley Phenix, MD        If you are unable to obtain an appointment, unable to obtain newly prescribed medications, or are unclear about any of your discharge instructions please contact your discharging physician at [phone] (M-F, 8am-3pm) or weekends and after hours via the hospital operator [phone], hospital case manager, or your primary care physician.    Imaging Studies while hospitalized:  CT Angiogram Chest    Result Date: 07/27/2022  1. No evidence of pulmonary  embolism. 2. Stable small left pleural effusion. 3. Stable small pericardial effusion. 4. Findings which may be associated with pulmonary hypertension. Legrand Como [facility] 07/27/2022 12:39 AM    XR Hip left 2-3 vw with pelvis    Result Date: 07/25/2022    Mild to moderate bilateral hip degenerative changes. Moderate rectal stool burden. Tama Headings, MD 07/25/2022 12:43 PM    US Venous Low Extrem Duplex [place] Bilat    Result Date: 07/24/2022   Left femoral-popliteal DVT extending to the left peroneal veins and posterior tibial veins, with associated mild soft tissue swelling/edema. Short segment nonocclusive thrombus within the right common femoral vein. Oley Balm, MD 07/24/2022 5:18 PM    CT Abd/Pelvis without Contrast    Result Date: 07/24/2022   1. Nonobstructing left renal stones. 2. Small bladder stones. 3. Additional incidental findings as described above. Edison Simon, MD 07/24/2022 1:43  AM    CT Chest without Contrast    Result Date: 07/24/2022  Small left pleural effusion, with minimal bibasilar areas of atelectasis. No focal consolidation. Slightly patulous and fluid-filled esophagus can be seen in the setting of gastroesophageal reflux. 2.6 x 2.0 x 3.5 cm low-attenuation lesion within the left thyroid lobe. Correlation with ultrasound is recommended for follow-up. Enlarged main pulmonary trunk, suggestive of underlying pulmonary arterial hypertension. Status post cholecystectomy. Nonobstructing left renal calculus. Timmie Foerster MD, MD 07/24/2022 1:40 AM    X-ray chest AP portable    Result Date: 07/23/2022   1. Small left pleural effusion. Dense left basilar airspace opacity suspicious for pneumonia. 2. Air-filled structure projecting over the lower mediastinum suggestive of hiatal hernia. Air also noted distending the esophagus. If indicated, further assessment can be made with CT. Denice Paradise, MD 07/23/2022 10:42 PM   Echo Results       Procedure Component Value Units Date/Time    Echo Adult TTE Complete [id number] Collected: 07/26/22 1403     Updated: 07/26/22 1822     Visually Estimated Ejection Fraction Range 60-65     Atrial Septum Findings No evidence of interatrial shunt by color Doppler.     Aortic Valve Findings The aortic valve is tricuspid.     Aortic Valve Findings There is no aortic stenosis.     Aortic Valve Findings There is trace aortic regurgitation.     Aortic Valve Findings There is moderate thickening and calcification of the aortic leaflets.     Pulmonary Valve Findings The pulmonic valve is structurally normal.     Pulmonary Valve Findings There is mild pulmonic regurgitation.     Mitral Valve Findings The mitral valve is thickened.     Mitral Valve Findings There is mild mitral regurgitation.     Tricuspid Valve Findings The tricuspid valve is structurally normal.     Tricuspid Valve Findings There is mild tricuspid regurgitation.     Tricuspid Valve Findings Mild  pulmonary hypertension with estimated right ventricular systolic pressure of  34 mmHg.     Site RV Size (AS) normal in size     RV Function Normal     Site RA Size (AS) normal in size     MV Regurgitation Severity mild     Site MV Stenosis (AS) mild     TV Regurgitation Severity mild     AV Regurgitation Severity trace     Aortic Stenosis Severity no     Site PV Stenosis (AS) no Doppler evidence of pulmonic valve sclerosis or stenosis  PV Regurgitation Severity mild     MV Area (PHT) 2.113     LA Volume Index (BP A-L) 44     LA Dimension (2D) 4.5     LA Dimension (2D) 4.8     IVS Diastolic Thickness (2D) 99991111     LVID diastole (2D) 4.04     LVID systole (2D) 2.79     AV Mean Gradient 5     AV Mean Gradient 4     AV Mean Gradient 5     AV Mean Gradient 5     AV Mean Gradient 4     AV Peak Velocity 1.49     AV Peak Velocity 1.47     AV Peak Velocity 1.52     AV Peak Velocity 1.52     AV Peak Velocity 1.45     AV Area (Cont Eq VTI) 2.53     AV Area (Cont Eq VTI) 2.543     Prox Ascending Aorta Diameter 2.9     MV Mean Gradient 4     MV E/A 0.7     MV E/A 0.716     RV Basal Diastolic Dimension 0000000     RV Systolic Pressure 34     RV Systolic Pressure 0000000     TAPSE 1.55     TAPSE 2.12    Narrative:      Name:     Meredith Baker  Age:     76 years  DOB:     [birth year]  Gender:     Female  MRN:     [id number]  Wt:     227 lb  BSA:     2.18 m2  HR:     69 bpm  Systolic BP:     0000000 mmHg  Diastolic BP:     72 mmHg  Technical Quality:     Adequate  Exam Date/Time:     07/26/2022 2:03 PM  Study Site:     [facility]  Exam Type:     ECHO ADULT TTE COMPLETE  Technically difficult due to:     Body Habitus    Study Info  Indications      Chest pain with high cardiac risk -  Procedure    Complete two-dimensional, color flow and spectral Doppler transthoracic  echocardiogram is performed.    Staff  Sonographer:     Inocente Salles RCS  Ordering Physician:     Pritpal S Saggu    62 in      History/Risk Factors  Hypertension:      Yes  Hyperlipidemia:     Yes    Additional Patient History    nstemi.      Summary    * Left ventricular ejection fraction is normal with an estimated ejection  fraction of 60-65%.    * There is severe concentric left ventricular hypertrophy.    * Left ventricular diastolic filling parameters are consistent with Grade I  diastolic dysfunction (impaired relaxation pattern).    * The left atrium is moderately dilated.    * There is moderate thickening and calcification of the aortic leaflets.    * There is trace aortic regurgitation.    * There is mild pulmonic regurgitation.    * There is mild mitral regurgitation.    * There is mild tricuspid regurgitation.    * Mild pulmonary hypertension with estimated right ventricular systolic  pressure of  34 mmHg.  Findings  Left Ventricle    The left ventricle is normal in size. There is severe concentric left  ventricular hypertrophy. Left ventricular ejection fraction is normal with an  estimated ejection fraction of 60-65%. Left ventricular segmental wall motion  is normal. Left ventricular diastolic filling parameters are consistent with  Grade I diastolic dysfunction (impaired relaxation pattern).  Right Ventricle    The right ventricular cavity size is normal in size. Normal right  ventricular systolic function.      Left Atrium    The left atrium is moderately dilated.    Right Atrium    The right atrium is normal in size.    Atrial Septum    No evidence of interatrial shunt by color Doppler.      Aortic Valve    The aortic valve is tricuspid. There is no aortic stenosis. There is trace  aortic regurgitation. There is moderate thickening and calcification of the  aortic leaflets.    Pulmonary Valve    The pulmonic valve is structurally normal. There is mild pulmonic  regurgitation.    Mitral Valve    The mitral valve is thickened. There is mild mitral regurgitation.    Tricuspid Valve    The tricuspid valve is structurally normal. There is mild  tricuspid  regurgitation. Mild pulmonary hypertension with estimated right ventricular  systolic pressure of  34 mmHg.      Aorta    The aortic root is not well visualized. The ascending aorta is normal in  size at 2.9 cm in diameter.    Inferior Vena Cava    The IVC is normal in size with > 50% respiratory variance consistent with  normal RA pressure of 3 mmHg.    Pericardium / Pleural Effusion    No pericardial effusion visualized.      Measurements  2D Measurements  ----------------------------------------------------------------------  Name                                 Value        Normal  ----------------------------------------------------------------------    Parasternal 2D  ----------------------------------------------------------------------   IVS Diastolic Thickness  (2D)                               1.78 cm     0.60-0.90   LVID Diastole (2D)                 4.04 cm     [id number]    LVIW Diastolic Thickness  (2D)                               1.56 cm     0.60-0.95   LVID Systole (2D)                  2.79 cm     2.20-3.50   LVOT Diameter                      2.00 cm                 LA Dimension (2D)                  4.80 cm     2.70-3.80   Prox Asc Ao Diameter  2.90 cm     2.30-3.10     Apical 2D Dimensions  ----------------------------------------------------------------------   RV Basal Diastolic  Dimension                          3.49 cm     2.50-4.10   LA Volume Index (BP A-L)        43.86 ml/m2   16.00-34.00   RA Area (4C)                     14.80 cm2       <=18.00  M-mode Measurements  ----------------------------------------------------------------------  Name                                 Value        Normal  ----------------------------------------------------------------------    M-Mode  ----------------------------------------------------------------------  TAPSE                              2.12 cm        >=1.60  LVOT/Aortic Valve Doppler  Measurements  ----------------------------------------------------------------------  Name                                 Value        Normal  ----------------------------------------------------------------------    LVOT Doppler  ----------------------------------------------------------------------  LVOT Peak Velocity                1.22 m/s                   AoV Doppler  ----------------------------------------------------------------------  AV Peak Velocity                  1.52 m/s                 AV Mean Gradient                    5 mmHg           <20   AV Area Index (Cont Eq Vel)     1.2 cm2/m2                 AV Area (Cont Eq VTI)             2.54 cm2        >=3.00   AV Area Index (Cont Eq VTI)     1.17 cm2/m2                 AV V1/V2 Ratio                        0.80  RVOT/Pulmonic Valve Doppler Measurements  ----------------------------------------------------------------------  Name                                 Value        Normal  ----------------------------------------------------------------------    PV Doppler  ----------------------------------------------------------------------  PV Peak Velocity                  1.03 m/s  Mitral Valve Measurements  ----------------------------------------------------------------------  Name  Value        Normal  ----------------------------------------------------------------------    MV Doppler  ----------------------------------------------------------------------  MV E Peak Velocity                1.16 m/s                 MV A Peak Velocity                1.62 m/s                 MV E/A                                0.72                 MV Mean Gradient                 4.00 mmHg                 MV Pressure Half Time                76 ms                   MV Annular TDI  ----------------------------------------------------------------------  MV Septal e' Velocity             0.03 m/s                 MV E/e' (Septal)                      36.83        <=8.00  Tricuspid Valve Measurements  ----------------------------------------------------------------------  Name                                 Value        Normal  ----------------------------------------------------------------------    TV Regurgitation Doppler  ----------------------------------------------------------------------  TR Peak Velocity                  2.77 m/s                 TR Peak Gradient                   31 mmHg                 RA Pressure                         3 mmHg           <=3   RV Systolic Pressure               34 mmHg           <36  Aorta / Venous Measurements  ----------------------------------------------------------------------  Name                                 Value        Normal  ----------------------------------------------------------------------    IVC/SVC  ----------------------------------------------------------------------  IVC Diameter (Exp 2D)              1.85 cm        <=2.10      Report Signatures  Finalized by Vonzell Schlatter  MD on 07/26/2022 06:21 PM  Preliminary by Inocente Salles  RCS on 07/26/2022 03:13 PM          No results found for this or any previous visit.    Please be sure to review these imaging studies with your primary medical provider.

## 2022-07-29 NOTE — PT Progress Note (Signed)
Physical Therapy Note    Physical Therapy Treatment  Meredith Baker  Post Acute Care Therapy Recommendations   Discharge Recommendations:  SNF    DME needs IF patient is discharging home: Dressing stick, Long-handled sponge, Sock aid, Long-handled shoehorn, Hospital bed, Grab bars, BSC, Hoyer Lift    Therapy discharge recommendations may change with patient status.  Please refer to most recent note for up-to-date recommendations.    Unit: [facility]  Bed: A2502/A2502-B    ___________________________________________________    Time of treatment:  PT Received On: 07/29/22  Start Time: 1618  Stop Time: 1638  Time Calculation (min): 20 min  Total Treatment Time (min): 10 (co tx with OT)    Chart Review and Collaboration with Care Team: 5 minutes, not included in above time.    PT Visit Number: 3    ___________________________________________________    Precautions:   Precautions  Weight Bearing Status: no restrictions  Restricted BP Precautions: no R UE  Fall Risks: High, Impaired balance/gait, Impaired mobility  Other Precautions: Bleeding    Personal Protective Equipment (PPE)  gloves    Updated X-Rays/Tests/Labs:  Lab Results   Component Value Date/Time    HGB 8.5 (L) 07/29/2022 02:54 AM    HCT 26.8 (L) 07/29/2022 02:54 AM    K 4.4 07/29/2022 02:54 AM    NA 135 07/29/2022 02:54 AM    INR 1.6 (H) 07/24/2022 01:17 AM    TROPI 103.4 (AA) 07/24/2022 06:38 AM    TROPI 108.5 (AA) 07/24/2022 12:26 AM    TROPI calc n/a 07/24/2022 12:26 AM    TROPI 92.0 (AA) 07/23/2022 04:48 PM       All imaging reviewed, please see chart for details.      Subjective:  "My knee hurts."     Patient Goal: get stronger    Pain Assessment  Pain Assessment: Numeric Scale (0-10)  Pain Score: 8-severe pain  POSS Score: Awake and Alert  Pain Location: Knee  Pain Orientation: Left  Pain Descriptors: Sore  Pain Frequency: Increases with movement  Effect of Pain on Daily Activities: moderate  Patient's Stated Comfort Functional Goal:  0-No pain  Pain Intervention(s): Medication (See eMAR);Repositioned;Ambulation/increased activity;Massage           Patient's medical condition is appropriate for Physical Therapy intervention at this time.  Patient is agreeable to participation in the therapy session. Nursing clears patient for therapy.      Objective:  Observation of Patient/Vital Signs:  Initial BP in sitting EOB 91 systolic with pt reporting some dizziness, with cont static sitting and self support to maintain upright trunk 2nd reading at 99 systolic with some resolution of symptoms. Other VSS       Cognition/Neuro Status  Arousal/Alertness: Appropriate responses to stimuli  Attention Span: Appears intact  Orientation Level: Oriented X4  Memory: Appears intact  Following Commands: Follows all commands and directions without difficulty  Safety Awareness: independent  Insights: Fully aware of deficits  Problem Solving: minimal assistance  Behavior: attentive;calm;cooperative  Motor Planning: decreased initiation;decreased processing speed  Coordination: GMC impaired    Musculoskeletal Examination                        Functional Mobility  Supine to Sit: Maximal Assist;Increased Time;Increased Effort;HOB raised;to Right (x 2)  Scooting to HOB: Dependent  Scooting to EOB: Maximal Assist  Sit to Supine: Maximal Assist;to Left (x 2)  Sit to Stand: Unable to assess (Comment)  Locomotion  Ambulation: Unable to assess (Comment) (non ambulatory at baseline)  Distance Walked (ft) (Step 6,7): 0 Feet          Neuro Re-Ed  Sitting Balance: contact guard assist;minimal assist           Educated the Patient to role of physical therapy, plan of care, goals of therapy and safety with mobility and ADLs, energy conservation techniques, discharge instructions, home safety with verbalized understanding .    Patient left in bed with alarm and all other medical equipment in place and call bell and all personal items/needs within reach.  RN notified of session  outcome. CM team and attending have been previously notified of d/c recommendation; no updates to d/c recommendation since that time.      Assessment:  Pt cont to have significant general weakness and pain in bil knees limiting bed mobility. Again req maxA x 2 for sitting transfer. Would benefit from SNF.                 PMP Activity: Step 4 - Dangle at Bedside  Distance Walked (ft) (Step 6,7): 0 Feet    Plan:  Treatment/Interventions: Exercise, Neuromuscular re-education, Functional transfer training, LE strengthening/ROM, Bed mobility, Compensatory technique education      PT Frequency: 2-3x/wk   Continue plan of care.    Goals:  Goals  Goal Formulation: With patient  Time for Goal Acheivement: By time of discharge  Goals: Select goal  Pt Will Roll Right: with minimal assist, to maximize functional mobility and independence, Partly met  Pt Will Go Supine To Sit: with moderate assist, to maximize functional mobility and independence, Partly met  Pt Will Achieve Sitting Balance: 3/5 sits without UE support up to 30 seconds, to maximize functional mobility and independence, Partly met  Pt Will Perform Home Exer Program: independent, to maximize functional mobility and independence, Partly met      Lamar Sprinkles, PT, DPT   [place] [id number]  [facility]  M 10-6:30, T 10-4:30, W 10-5:30, Th 10-8:30 F 11-8:30  Spectralink [id number]  07/29/2022 5:08 PM      [facility]  Patient: Meredith Baker MRN#: [id number]  Unit: [facility] Bed: A2502/A2502-B

## 2022-07-29 NOTE — Plan of Care (Signed)
NURSING SHIFT NOTE     Patient: Meredith Baker  Day: 5      SHIFT EVENTS     Shift Narrative/Significant Events (PRN med administration, fall, RRT, etc.):     Pt is A&Ox3 to 4 (disoriented to time this AM). NSR on telemetry with HR in 70s. No c/o headache, dizziness, or chest pain. Reported N/V and PRN antiemetic given as ordered. Intermittent discomfort with breathing reported and checked pt's O2 saturation stating 95% in RA. Trio rounding occurred. OT done. Wound care done. Pt updated on plan of care.    Safety and fall precautions remain in place. Purposeful rounding completed.          ASSESSMENT     Changes in assessment from patient's baseline this shift:    Neuro: No  CV: No  Pulm: No  Peripheral Vascular: No  HEENT: No  GI: No  BM during shift: No, Last BM: Last BM Date: 07/28/22  GU: No   Integ: No  MS: No    Pain: None  Pain Interventions: None  Medications Utilized: None    Mobility: PMP Activity: Step 3 - Bed Mobility of Distance Walked (ft) (Step 6,7): 0 Feet           Lines     Patient Lines/Drains/Airways Status       Active Lines, Drains and Airways       Name Placement date Placement time Site Days    Peripheral IV 07/24/22 20 G Anterior;Left Forearm 07/24/22  0030  Forearm  5    External Urinary Catheter 07/23/22  1602  --  5                         VITAL SIGNS     Vitals:    07/29/22 0857   BP: 112/68   Pulse: 87   Resp:    Temp:    SpO2:        Temp  Min: 97.9 F (36.6 C)  Max: 98.6 F (37 C)  Pulse  Min: 83  Max: 103  Resp  Min: 18  Max: 18  BP  Min: 112/68  Max: 169/77  SpO2  Min: 97 %  Max: 99 %      Intake/Output Summary (Last 24 hours) at 07/29/2022 1208  Last data filed at 07/28/2022 1900  Gross per 24 hour   Intake 375 ml   Output 600 ml   Net -225 ml            CARE PLAN       Problem: Moderate/High Fall Risk Score >5  Goal: Patient will remain free of falls  Outcome: Progressing  Flowsheets (Taken 07/29/2022 1207)  VH High Risk (Greater than 13):   ALL REQUIRED LOW INTERVENTIONS   ALL  REQUIRED MODERATE INTERVENTIONS   BED ALARM WILL BE ACTIVATED WHEN THE PATEINT IS IN BED WITH SIGNAGE "RESET BED ALARM"   Include family/significant other in multidisciplinary discussion regarding plan of care as appropriate   Request PT/OT therapy consult order from physician for patients with gait/mobility impairment   Use of floor mat     Problem: Compromised Tissue integrity  Goal: Damaged tissue is healing and protected  Outcome: Progressing  Flowsheets (Taken 07/29/2022 1207)  Damaged tissue is healing and protected:   Monitor/assess Braden scale every shift   Provide wound care per wound care algorithm   Reposition patient every 2 hours and as needed unless able to  reposition self   Increase activity as tolerated/progressive mobility   Relieve pressure to bony prominences for patients at moderate and high risk   Avoid shearing injuries   Keep intact skin clean and dry   Use incontinence wipes for cleaning urine, stool and caustic drainage. Foley care as needed   Use bath wipes, not soap and water, for daily bathing   Monitor external devices/tubes for correct placement to prevent pressure, friction and shearing   Encourage use of lotion/moisturizer on skin   Monitor patient's hygiene practices     Problem: Fluid and Electrolyte Imbalance/ Endocrine  Goal: Fluid and electrolyte balance are achieved/maintained  Outcome: Progressing  Flowsheets (Taken 07/29/2022 1207)  Fluid and electrolyte balance are achieved/maintained:   Monitor/assess lab values and report abnormal values   Assess and reassess fluid and electrolyte status   Observe for cardiac arrhythmias   Monitor for muscle weakness

## 2022-07-29 NOTE — Discharge Summary (Signed)
[facility]      Patient: Meredith Baker  Admission Date: 07/23/2022   DOB: 04/09/1946  Discharge Date: 07/29/2022    MRN: [id number]  Discharge Attending: A Delane Ginger, MD     Referring Physician: Pcp, None, MD  PCP: Pcp, None, MD       DISCHARGE SUMMARY     Discharge Information   Admission Diagnosis:   NSTEMI (non-ST elevated myocardial infarction)    Discharge Diagnosis:   Active Hospital Problems    Diagnosis    NSTEMI (non-ST elevated myocardial infarction)        Admission Condition: Guarded  Discharge Condition: Stable and improved  Consultants: Cardiology/pathology and gastroenterology  Functional Status: Bedbound  Discharged to: [facility] Course   Presentation History   76 years old female with history of hypertension, type 2 diabetes, hyperlipidemia, osteoarthritis, bedbound, history of bilateral DVT who presented for evaluation of generalized weakness presented for weakness and inability to care for herself admitted with a diagnosis of UTI and NSTEMI cardiology on board    See HPI for details.    Hospital Course (5 Days)     Anemia  Hemoglobin has been dropping from  From 12-10--8.2  I have ordered H&H monitor every 6 hours  Fecal occult blood test  Given patient has history of DVT I will continue the anticoagulation for now  Fecal occult blood test  Will monitor closely  No obvious bleeding noted  11/29: So far H&H stable between 8-9, continue heparin drip, pending hematology evaluation) final recommendation, fecal occult blood test pending, patient had 1 bowel movement yesterday which is brown, no obvious bleeding noted  11/30: H&H stayed stable, patient switched back to Pradaxa per heme-onc recommendation, fecal occult blood test came positive, GI consulted to weigh in, started on PPI  12/1: Discussed with GI, recommended outpatient EGD colonoscopy and Protonix 40 mg twice daily for a month followed by daily, plan communicated with the patient as well as niece    Sepsis likely from  UTI  Presented with leukocytosis and tachycardia  CT scan of the chest showed small left pleural effusion and evidence of pulmonary hypertension  UA consistent with UTI  Blood culture and urine culture came negative  Initially started on empirical broad-spectrum antibiotic vancomycin and Zosyn  MRSA test is negative  CT abdomen showed nonobstructive stone and small bladder stone  Antibiotic de-escalated to ceftriaxone  Leukocytosis improved  11/29-11/30: Patient will complete 5 days course of antibiotics today        History of DVT  Patient had bilateral lower extremity DVT back in September 2023 currently on Pradaxa  Repeat venous ultrasound showed left femoral popliteal DVT extending to the left peroneal vein and posterior tibialis vein with associated mild soft tissue swelling/edema and short segment nonocclusive thrombus within the right common femoral vein  I will involve hematology to guide on treatment  11/29: CTA ruled out PE, for now we will continue heparin drip, will switch her back to [place] once hematology clears  11/30: Patient is a switch to Pradaxa after hematology cleared the patient  12/1: Patient tolerated Pradaxa, outpatient follow-up with hematology    NSTEMI  Elevated troponin  Likely demand ischemia  Troponin trended up from 90-108  Patient is on aspirin, statin and Pradaxa  Cardiology on board, recommended to get CTA  Follow-up recommendation  11/29-12/1: 2D echo showed normal EF, no LV wall motion abnormality, elevated troponin is likely from demand ischemia and anemia,  continue aspirin/statin/Coreg/losartan.  Outpatient follow-up with cardiology        Chronic stable medical condition  Hypertension: Continue amlodipine/metoprolol/losartan  11/29-12/1: Blood pressure within acceptable range, outpatient follow-up with PCP     Type 2 diabetes  Carb controlled diet  Sliding scale  Hypoglycemia protocol  12/1: Increase Lantus to 10, continue sliding scale, and carb controlled diet.  :  Morbid  obesity  Outpatient follow-up     Bedbound  Wound on her left iliac area  Wound nurse consult       Procedures/Imaging:   Upon my review: Negative except discussed above         Progress Note/Physical Exam at Discharge     Subjective:Patient is stable for discharge.    Vitals:    07/29/22 0755 07/29/22 0857 07/29/22 1240 07/29/22 1620   BP: 112/68 112/68 109/70    Pulse: 94 87 79 88   Resp: 18  18    Temp: 98.2 F (36.8 C)  97.5 F (36.4 C)    TempSrc: Oral  Oral    SpO2: 97% 95% 98%    Weight: 110 kg (242 lb 8.1 oz)      Height:               General: Chronically sick looking, NAD, AAOx2-3  HEENT: Sclera anicteric, no conjunctival injection, OP: Clear, MMM  Neck: Supple, FROM, no LAD  Cardiovascular: RRR, no m/r/g  Lungs: CTAB, no w/r/r  Abdomen: Soft, +BS, NT/ND, no masses, no g/r  Extremities: No C/C/E  Skin: No rashes or lesions noted  Neuro: Weak all over but able to follow commands and move all extremities       Diagnostics     Labs/Studies Pending at Discharge: Yes outpatient EGD and colonoscopy    Last Labs   Recent Labs   Lab 07/29/22  0254 07/28/22  0908 07/28/22  0439 07/27/22  0455 07/26/22  2238   WBC 8.64 9.56*  --   --  8.99   RBC 2.73* 2.84*  --   --  3.07*   Hgb 8.5* 8.8* 8.4*  More results in Results Review 9.3*   Hematocrit 26.8* 28.2* 27.2*  More results in Results Review 30.5*   MCV 98.2* 99.3*  --   --  99.3*   Platelets 481* 529*  --   --  481*   More results in Results Review = values in this interval not displayed.       Recent Labs   Lab 07/29/22  0254 07/28/22  0908 07/27/22  0455 07/26/22  0256 07/24/22  0638   Sodium 135 136 138 142 143   Potassium 4.4 4.5 4.1 3.4* 3.4*   Chloride 104 107 109 111 109   CO2 '26 23 21 24 24   '$ BUN 13.0 11.0 10.0 10.0 13.0   Creatinine 0.7 0.7 0.8 0.7 0.7   Glucose 284* 189* 243* 174* 175*   Calcium 8.7 8.5 8.3 7.9 8.6   Magnesium  --  1.6 1.8 1.0*  --        Microbiology Results (last 15 days)       Procedure Component Value Units Date/Time    MRSA culture  [id number] Collected: 07/24/22 1932    Order Status: Completed Specimen: Culturette from Nasal Swab Updated: 07/26/22 0214     Culture MRSA Surveillance Negative for Methicillin Resistant Staph aureus    MRSA culture [id number] Collected: 07/24/22 1932    Order Status: Completed Specimen: Culturette from Throat Updated:  07/26/22 0214     Culture MRSA Surveillance Negative for Methicillin Resistant Staph aureus    Urine culture [id number] Collected: 07/24/22 1932    Order Status: Completed Specimen: Bladder Updated: 07/26/22 0413    Narrative:      ORDER#: [id number]                                    ORDERED BY: Enis Gash  SOURCE: Urine                                        COLLECTED:  07/24/22 19:32  ANTIBIOTICS AT COLL.:                                RECEIVED :  07/24/22 20:10  Culture Urine                              FINAL       07/26/22 04:13  07/26/22   No growth of >1,000 CFU/ML, No further work      Culture Blood Aerobic and Anaerobic [id number] Collected: 07/23/22 2218    Order Status: Completed Specimen: Blood, Venipuncture Updated: 07/29/22 0421    Narrative:      The order will result in two separate 8-65m bottles  Please do NOT order repeat blood cultures if one has been  drawn within the last 48 hours  UNLESS concerned for  endocarditis  AVOID BLOOD CULTURE DRAWS FROM CENTRAL LINE IF POSSIBLE  Indications:->Sepsis  ORDER#: [id number]                                   ORDERED BY: BEnis Gash SOURCE: Blood, Venipuncture                          COLLECTED:  07/23/22 22:18  ANTIBIOTICS AT COLL.:                                RECEIVED :  07/24/22 01:42  Culture Blood Aerobic and Anaerobic        FINAL       07/29/22 04:21  07/29/22   No growth after 5 days of incubation.      Culture Blood Aerobic and Anaerobic [[id number]: 07/23/22 2218    Order Status: Completed Specimen: Blood, Venipuncture Updated: 07/29/22 0421    Narrative:      The order will result in two separate 8-13m bottles  Please do NOT order repeat blood cultures if one has been  drawn within the last 48 hours  UNLESS concerned for  endocarditis  AVOID BLOOD CULTURE DRAWS FROM CENTRAL LINE IF POSSIBLE  Indications:->Sepsis  ORDER#: [id number]                                  ORDERED BY: BREnis GashSOURCE: Blood, Venipuncture  COLLECTED:  07/23/22 22:18  ANTIBIOTICS AT COLL.:                                RECEIVED :  07/24/22 01:42  Culture Blood Aerobic and Anaerobic        FINAL       07/29/22 04:21  07/29/22   No growth after 5 days of incubation.      COVID-19 (SARS-CoV-2) and Influenza A/B, NAA (Liat Rapid) [id number] Collected: 07/23/22 2218    Order Status: Completed Specimen: Culturette from Nasopharyngeal Updated: 07/23/22 2259     Purpose of COVID testing Diagnostic -PUI     SARS-CoV-2 Specimen Source Nasal Swab     SARS CoV 2 Overall Result Not Detected     Comment: __________________________________________________  -A result of "Detected" indicates POSITIVE for the    presence of SARS CoV-2 RNA  -A result of "Not Detected" indicates NEGATIVE for the    presence of SARS CoV-2 RNA  __________________________________________________________  Test performed using the Roche cobas Liat SARS-CoV-2 assay. This assay is  only for use under the Food and Drug Administration's Emergency Use  Authorization. This is a real-time RT-PCR assay for the qualitative  detection of SARS-CoV-2 RNA. Viral nucleic acids may persist in vivo,  independent of viability. Detection of viral nucleic acid does not imply the  presence of infectious virus, or that virus nucleic acid is the cause of  clinical symptoms. Negative results do not preclude SARS-CoV-2 infection and  should not be used as the sole basis for diagnosis, treatment or other  patient management decisions. Negative results must be combined with  clinical observations, patient history, and/or epidemiological information.  Invalid results may be  due to inhibiting substances in the specimen and  recollection should occur. Please see Fact Sheets for patients and providers  located:  https://www.benson-chung.com/          Influenza A Not Detected     Influenza B Not Detected     Comment: Test performed using the Roche cobas Liat SARS-CoV-2 & Influenza A/B assay.  This assay is only for use under the Food and Drug Administration's  Emergency Use Authorization. This is a multiplex real-time RT-PCR assay  intended for the simultaneous in vitro qualitative detection and  differentiation of SARS-CoV-2, influenza A, and influenza B virus RNA. Viral  nucleic acids may persist in vivo, independent of viability. Detection of  viral nucleic acid does not imply the presence of infectious virus, or that  virus nucleic acid is the cause of clinical symptoms. Negative results do  not preclude SARS-CoV-2, influenza A, and/or influenza B infection and  should not be used as the sole basis for diagnosis, treatment or other  patient management decisions. Negative results must be combined with  clinical observations, patient history, and/or epidemiological information.  Invalid results may be due to inhibiting substances in the specimen and  recollection should occur. Please see Fact Sheets for patients and providers  located: http://olson-hall.info/.         Narrative:      o Collect and clearly label specimen type:  o PREFERRED-Upper respiratory specimen: One Nasal Swab in  Transport Media.  o Hand deliver to laboratory ASAP  Diagnostic -PUI             Patient Instructions   Discharge Diet: Heart healthy diet  Discharge Activity: As tolerated    Follow Up Appointment:   Follow-up  Information       West Carbo, MD Follow up in 1 week(s).    Specialties: Gastroenterology, Internal Medicine  Contact information:  [place] [address]  [phone]               Tia Alert, MD Follow up in 1 week(s).    Specialties:  Medical Oncology, Hematology  Contact information:  [address]  [place] [address]  [phone]               Vonzell Schlatter, MD Follow up in 1 week(s).    Specialties: Interventional Cardiology, Internal Medicine, Cardiology  Contact information:  [address]  [place] [address]  [phone]               Pcp, None, MD .                              Discharge Medications:     Medication List        START taking these medications      aspirin 81 MG chewable tablet  Chew 1 tablet (81 mg) by mouth daily  Start taking on: July 30, 2022     atorvastatin 40 MG tablet  Commonly known as: LIPITOR  Take 1 tablet (40 mg) by mouth nightly     carboxymethylcellulose 0.5 % ophthalmic solution  Commonly known as: REFRESH TEARS  Place 1 drop into both eyes 3 (three) times daily as needed (dry eye)     insulin glargine 100 UNIT/ML injection  Commonly known as: LANTUS  Inject 10 Units into the skin nightly     pantoprazole 40 MG tablet  Commonly known as: PROTONIX  Take 1 tablet (40 mg) by mouth 2 (two) times daily for 30 days, THEN 1 tablet (40 mg) daily.  Start taking on: July 30, 2022            CONTINUE taking these medications      acetaminophen 500 MG tablet  Commonly known as: TYLENOL     CENTRUM SILVER 50+WOMEN PO     dabigatran 150 MG Caps  Commonly known as: PRADAXA     losartan 100 MG TABS, hydroCHLOROthiazide 25 MG TABS     metoprolol succinate 200 MG 24 hr tablet  Commonly known as: TOPROL-XL     TURMERIC PO     valACYclovir 1000 MG tablet  Commonly known as: VALTREX     vitamin D (ergocalciferol) 50000 UNIT Caps  Commonly known as: DRISDOL            STOP taking these medications      amLODIPine 5 MG tablet  Commonly known as: NORVASC     HUMULIN 70/30 KWIKPEN SC               Where to Get Your Medications        These medications were sent to [place], [place] - [address]  [address] [place] [address]      Phone: [phone]    aspirin 81 MG chewable tablet  atorvastatin 40 MG tablet  pantoprazole 40 MG tablet       Information about where to get these medications is not yet available    Ask your nurse or doctor about these medications  carboxymethylcellulose 0.5 % ophthalmic solution  insulin glargine 100  UNIT/ML injection          Time spent examining patient, discussing with patient/family regarding hospital course, chart review, reconciling medications and discharge planning: 40 minutes.    Signed,  Arley Phenix, MD    6:07 PM 07/29/2022

## 2022-07-29 NOTE — OT Progress Note (Signed)
Occupational Therapy Treatment  [name]        Post Acute Care Therapy Recommendations       DME needs IF patient is discharging home: No additional equipment/DME recommended at this time    Therapy discharge recommendations may change with patient status.  Please refer to most recent note for up-to-date recommendations.    Unit: [facility]  Bed: A2502/A2502-B    ___________________________________________________    Time of treatment:  OT Received On: 07/29/22  Start Time: 1618  Stop Time: 1638  Time Calculation (min): 20 min  Total Treatment Time (min): 10 ([place] w/ PT to maximize safety)    Chart Review and Collaboration with Care Team: 5 minutes, not included in above time.    OT Visit Number: 3      Precautions and Contraindications:    Precautions  Weight Bearing Status: no restrictions  Restricted BP Precautions: no R UE  Fall Risks: High;Impaired balance/gait;Impaired mobility  Other Precautions: Bleeding    Personal Protective Equipment (PPE)  gloves and procedure mask    Updated Labs:  Lab Results   Component Value Date/Time    HGB 8.5 (L) 07/29/2022 02:54 AM    HCT 26.8 (L) 07/29/2022 02:54 AM    K 4.4 07/29/2022 02:54 AM    NA 135 07/29/2022 02:54 AM    INR 1.6 (H) 07/24/2022 01:17 AM    TROPI 103.4 (AA) 07/24/2022 06:38 AM    TROPI 108.5 (AA) 07/24/2022 12:26 AM    TROPI calc n/a 07/24/2022 12:26 AM    TROPI 92.0 (AA) 07/23/2022 04:48 PM       All imaging reviewed, please see chart for details.    Subjective: "I will sit at edge of bed."          Patient's medical condition is appropriate for Occupational Therapy intervention at this time.  Patient is agreeable to participation in the therapy session. Nursing clears patient for therapy.  Pain Assessment  Pain Assessment: No/denies pain      Objective:  Observation of Patient/Vital Signs:  Stable      Cognition/Neuro Status  Arousal/Alertness: Appropriate responses to stimuli  Attention Span: Appears intact  Orientation Level:  Oriented X4  Memory: Appears intact  Following Commands: Follows all commands and directions without difficulty  Safety Awareness: independent  Insights: Fully aware of deficits  Problem Solving: minimal assistance  Behavior: attentive;calm;cooperative  Motor Planning: decreased initiation;decreased processing speed  Coordination: GMC impaired    Functional Mobility  Scooting Transfers: Dependent;additional time (X2)  Supine to Sit Transfers: Maximal Assist;additional time (X2)  Sit to Supine Transfers: Maximal Assist;additional time (X2)  Sit to Stand Transfers: not tested      Educated the Patient to role of occupational therapy, plan of care, goals of therapy and safety with mobility and ADLs, energy conservation techniques with verbalized understanding  and demonstrated understanding.     Patient left in bed with alarm and all other medical equipment in place and call bell and all personal items/needs within reach.  RN notified of session outcome.         Assessment: Pt is progressing towards OT goals. Main limitations are decreased activity endurance, decreased functional mobility, weakness and fatigue limiting participation in functional mobility and ADL tasks. Pt has demonstrated an increase in sitting balance and activity endurance. Pt would benefit from continued OT services in order to maximize potential. Therapist continues to recommend [place] to SNF.  PMP Activity: Step 4 - Dangle at Bedside  Distance Walked (ft) (Step 6,7): 0 Feet      Plan:  OT Frequency Recommended: 2-3x/wk  Goal Formulation: Patient      Time For Goal Achievement: by time of discharge  ADL Goals  Patient will feed self: Independent, Goal met  Patient will groom self: Independent, Not met  Mobility and Transfer Goals  Other Goal: Patient will transitioned supine <> EOB c Min A; not met (partly met)                       Continue plan of care.      Theresia Lo, COTA/L    Physical Medicine and [facility]  [phone]    07/29/2022  4:59 PM    [facility]  Patient: Meredith Baker MRN#: [id number]   Unit: [place] [facility] Bed: A2502/A2502-B

## 2022-07-29 NOTE — Plan of Care (Signed)
NURSING SHIFT NOTE     Patient: Meredith Baker  Day: 5      SHIFT EVENTS     Patient awake alert and oriented X3. Room air , denies pain and SOB   Attached to the tele .1[address] Degree [address] Block, BBB     Plan of care on going ,Will continue to monitor vital signs and lab results     Safety and fall precautions remain in place. Purposeful rounding completed.          ASSESSMENT     Changes in assessment from patient's baseline this shift:    Neuro: No  CV: No  Pulm: No  Peripheral Vascular: No  HEENT: No  GI: No  BM during shift: No, Last BM: Last BM Date: 07/28/22  GU: No   Integ: No  MS: No    Pain: None    Mobility: PMP Activity: Step 4 - Dangle at Bedside of Distance Walked (ft) (Step 6,7): 0 Feet           Lines     Patient Lines/Drains/Airways Status       Active Lines, Drains and Airways       Name Placement date Placement time Site Days    Peripheral IV 07/24/22 20 G Anterior;Left Forearm 07/24/22  0030  Forearm  5    External Urinary Catheter 07/23/22  1602  --  6                         VITAL SIGNS     Vitals:    07/29/22 2006   BP: 120/57   Pulse: 91   Resp:    Temp:    SpO2:        Temp  Min: 97.5 F (36.4 C)  Max: 98.4 F (36.9 C)  Pulse  Min: 79  Max: 100  Resp  Min: 18  Max: 18  BP  Min: 109/70  Max: 169/77  SpO2  Min: 94 %  Max: 99 %      Intake/Output Summary (Last 24 hours) at 07/29/2022 2047  Last data filed at 07/29/2022 [id number]  Gross per 24 hour   Intake 240 ml   Output --   Net 240 ml              CARE PLAN        Problem: Pain interferes with ability to perform ADL  Goal: Pain at adequate level as identified by patient  Outcome: Progressing  Flowsheets (Taken 07/27/2022 1952 by Brandon Melnick, RN)  Pain at adequate level as identified by patient:   Identify patient comfort function goal   Assess for risk of opioid induced respiratory depression, including snoring/sleep apnea. Alert healthcare team of risk factors identified.   Reassess pain within 30-60 minutes of any procedure/intervention, per Pain  Assessment, Intervention, Reassessment (AIR) Cycle   Assess pain on admission, during daily assessment and/or before any "as needed" intervention(s)     Problem: Moderate/High Fall Risk Score >5  Goal: Patient will remain free of falls  Outcome: Progressing  Flowsheets  Taken 07/29/2022 2000 by Cathey Endow', RN  High (Greater than 13):   HIGH-Initiate use of floor mats as appropriate   HIGH-Pharmacy to initiate evaluation and intervention per protocol   HIGH-Apply yellow "Fall Risk" arm band   HIGH-Bed alarm on at all times while patient in bed   LOW-Fall Interventions Appropriate for Low Fall Risk   LOW-Anticoagulation education for injury  risk  Taken 07/29/2022 1207 by Mechele Dawley, RN  VH High Risk (Greater than 13):   ALL REQUIRED LOW INTERVENTIONS   ALL REQUIRED MODERATE INTERVENTIONS   BED ALARM WILL BE ACTIVATED WHEN THE PATEINT IS IN BED WITH SIGNAGE "RESET BED ALARM"   Include family/significant other in multidisciplinary discussion regarding plan of care as appropriate   Request PT/OT therapy consult order from physician for patients with gait/mobility impairment   Use of floor mat  Taken 07/27/2022 0800 by Brandon Melnick, RN  Moderate Risk (6-13): MOD-Floor mat at bedside (where available) if appropriate     Problem: Compromised Tissue integrity  Goal: Damaged tissue is healing and protected  Outcome: Progressing  Flowsheets (Taken 07/29/2022 1207 by Mechele Dawley, RN)  Damaged tissue is healing and protected:   Monitor/assess Braden scale every shift   Provide wound care per wound care algorithm   Reposition patient every 2 hours and as needed unless able to reposition self   Increase activity as tolerated/progressive mobility   Relieve pressure to bony prominences for patients at moderate and high risk   Avoid shearing injuries   Keep intact skin clean and dry   Use incontinence wipes for cleaning urine, stool and caustic drainage. Foley care as needed   Use bath wipes, not soap and water, for  daily bathing   Monitor external devices/tubes for correct placement to prevent pressure, friction and shearing   Encourage use of lotion/moisturizer on skin   Monitor patient's hygiene practices     Problem: Safety  Goal: Patient will be free from injury during hospitalization  Outcome: Progressing  Flowsheets (Taken 07/27/2022 2059 by Elodia Florence, RN)  Patient will be free from injury during hospitalization:   Assess patient's risk for falls and implement fall prevention plan of care per policy   Provide and maintain safe environment   Hourly rounding     Problem: Fluid and Electrolyte Imbalance/ Endocrine  Goal: Fluid and electrolyte balance are achieved/maintained  Outcome: Progressing  Flowsheets (Taken 07/29/2022 1207 by Mechele Dawley, RN)  Fluid and electrolyte balance are achieved/maintained:   Monitor/assess lab values and report abnormal values   Assess and reassess fluid and electrolyte status   Observe for cardiac arrhythmias   Monitor for muscle weakness     Problem: Hemodynamic Status: Cardiac  Goal: Stable vital signs and fluid balance  Outcome: Progressing  Flowsheets (Taken 07/26/2022 2107 by Elodia Florence, RN)  Stable vital signs and fluid balance:   Assess signs and symptoms associated with cardiac rhythm changes   Monitor lab values

## 2022-07-29 NOTE — Progress Notes (Addendum)
LOS # 5      Summary of Discharge Plan:  SNF-[place] at [facility]    Identified Possible Discharge Barriers:  Samuel Germany pending    CM Interventions and Outcome:  CM spoke with niece. Confirmed facilities of choice as [place] at [employer] and  [place] at [facility].  [address] CM is Ronni Rumble, [place]. CM received call and reviewed [place] plan. Belinda confirmed [facility] in network, [facility] not in network. Confirmed pt could not be found in [place] and instructed to send auth via fax.  CM called [place] at [facility] Spoke to [place]. Will review and call niece. Will have bed tomorrow. Weekend contact is [place] at [phone].  CM faxed SNF auth request to [place]. CM to follow up with Billy Fischer at [phone].     Per [facility] from [place] "clinically accepted penidng auth if denied as pt is bedbound will need 3871/loc ltc forms emailed to her neice who is on her way to the hospital now to get them completed please call me at [phone] if there are any further questions [place] Bright "    CM called niece, met with pt and niece at bedside. Updated on auth status, facility acceptance. Provided pt and niece with 3871/loc to complete to best of their ability along with other documents noted.     Discussed above Discharge Plan with (patient, family, Care Team, others):  Pt, Niece, Facility, Care Team    Case Management will continue to follow on patient's discharge needs.

## 2022-07-29 NOTE — Progress Notes (Signed)
07/29/22 1112   Case Management Quick Doc   Date of 2nd IMM Letter 07/29/22   Time of 2nd IMM letter [id number]     Meredith Baker, [place]

## 2022-07-29 NOTE — Plan of Care (Signed)
GI PLAN OF CARE    No GI Bleeding.  Patient on Pradaxa for extensive DVT, would need bridged with heparin if scope is needed.  Can plan for scopes as outpatient unless patient has overt GI bleeding while hospitalized.   GI will go on standby, call for changes or concerns.   Continue the pantoprazole 40 mg po daily.   Continue Miralax 17 gram po daily.

## 2022-07-30 LAB — VANCOMYCIN, RANDOM: Vancomycin Random: 5.9 ug/mL

## 2022-07-30 LAB — CBC
Absolute NRBC: 0 10*3/uL (ref 0.00–0.00)
Hematocrit: 30.3 % — ABNORMAL LOW (ref 34.7–43.7)
Hgb: 9.3 g/dL — ABNORMAL LOW (ref 11.4–14.8)
MCH: 30.6 pg (ref 25.1–33.5)
MCHC: 30.7 g/dL — ABNORMAL LOW (ref 31.5–35.8)
MCV: 99.7 fL — ABNORMAL HIGH (ref 78.0–96.0)
MPV: 8.7 fL — ABNORMAL LOW (ref 8.9–12.5)
Nucleated RBC: 0 /100 WBC (ref 0.0–0.0)
Platelets: 495 10*3/uL — ABNORMAL HIGH (ref 142–346)
RBC: 3.04 10*6/uL — ABNORMAL LOW (ref 3.90–5.10)
RDW: 19 % — ABNORMAL HIGH (ref 11–15)
WBC: 9.76 10*3/uL — ABNORMAL HIGH (ref 3.10–9.50)

## 2022-07-30 LAB — GLUCOSE WHOLE BLOOD - POCT
Whole Blood Glucose POCT: 192 mg/dL — ABNORMAL HIGH (ref 70–100)
Whole Blood Glucose POCT: 164 mg/dL — ABNORMAL HIGH (ref 70–100)
Whole Blood Glucose POCT: 169 mg/dL — ABNORMAL HIGH (ref 70–100)
Whole Blood Glucose POCT: 298 mg/dL — ABNORMAL HIGH (ref 70–100)
Whole Blood Glucose POCT: 227 mg/dL — ABNORMAL HIGH (ref 70–100)

## 2022-07-30 LAB — ANTI-XA,UFH: Anti-Xa, UFH: 0.1 IU/mL

## 2022-07-30 NOTE — UM Notes (Signed)
Continued Stay Review: 07/29/2022  Admission status: INPT      07/29/2022   GI POC NOTE    GI PLAN OF CARE     No GI Bleeding.  Patient on Pradaxa for extensive DVT, would need bridged with heparin if scope is needed.  Can plan for scopes as outpatient unless patient has overt GI bleeding while hospitalized.   GI will go on standby, call for changes or concerns.   Continue the pantoprazole 40 mg po daily.   Continue Miralax 17 gram po daily.        07/29/2022 MED PROG NOTE    76 years old female with history of hypertension, type 2 diabetes, hyperlipidemia, osteoarthritis, bedbound, history of bilateral DVT who presented for evaluation of generalized weakness presented for weakness and inability to care for herself admitted with a diagnosis of UTI and NSTEMI cardiology on board.    Anemia  Hemoglobin has been dropping from  From 12-10--8.2  I have ordered H&H monitor every 6 hours  Fecal occult blood test  Given patient has history of DVT I will continue the anticoagulation for now  Fecal occult blood test  Will monitor closely  No obvious bleeding noted    12/1: Discussed with GI, recommended outpatient EGD colonoscopy and Protonix 40 mg twice daily for a month followed by daily, plan communicated with the patient as well as niece     Sepsis likely from UTI  Presented with leukocytosis and tachycardia  CT scan of the chest showed small left pleural effusion and evidence of pulmonary hypertension  UA consistent with UTI  Blood culture and urine culture came negative  Initially started on empirical broad-spectrum antibiotic vancomycin and Zosyn  MRSA test is negative  CT abdomen showed nonobstructive stone and small bladder stone  Antibiotic de-escalated to ceftriaxone  Leukocytosis improved  11/29-11/30: Patient will complete 5 days course of antibiotics today   Type 2 diabetes  Carb controlled diet  Sliding scale  Hypoglycemia protocol  12/1: Increase Lantus to 10, continue sliding scale, and carb controlled  diet.  :    Lab 07/29/22  0254 07/28/22  0908 07/28/22  0439 07/26/22  2238   WBC 8.64 9.56*  --  8.99   RBC 2.73* 2.84*  --  3.07*   Hgb 8.5* 8.8* 8.4* 9.3*   Hematocrit 26.8* 28.2* 27.2* 30.5*   MCV 98.2* 99.3*  --  99.3*   Platelets 481* 529*  --  481*     Lab 07/29/22  0254 07/28/22  0908 07/27/22  0455 07/26/22  0256 07/24/22  0638   Potassium 4.4 4.5 4.1 3.4* 3.4*   Glucose 284* 189* 243* 174* 175*           ondansetron (ZOFRAN) injection 4 mg  Dose: 4 mg  Freq: Every 8 hours PRN Route: IV  PRN Reasons: Nausea,Vomiting  Start: 07/29/22 1044 -X 1 DOSE 07/29/2022        Marciano Sequin RN, BSN, ACM-RN, CCM  Utilization Review RN Case Manager [place]  Utilization Review Department  [address]  [place], [place] [address]  T [phone] (Confidential voice mail) C [phone] (Confidential voice mail)   Jerilynn Mages [phone] F [phone]  [email or web address]

## 2022-07-30 NOTE — Progress Notes (Signed)
Update as follows:    CM contacted [employer] at [phone] to check on auth. Cm was told Josem Kaufmann was denied due to the Pt being bed bound. Cm contacted the  Pt's niece who shared she does not have the Pt's keys to get into her home and there is no one available to stay with the Pt. Ms. Jannifer Franklin (niece) shared that she will work on trying to make supplemental arrangements for her aunt. Covering CM on 07/31/2022 please give Ms. Willis a call at [phone]. Covering will continue to follow.    Casey Burkitt, MSW, Supervisee in Social Work, Tourist information centre manager I

## 2022-07-30 NOTE — Plan of Care (Addendum)
NURSING SHIFT NOTE     Patient: Meredith Baker  Day: 6      SHIFT EVENTS     Shift Narrative/Significant Events (PRN med administration, fall, RRT, etc.):     Pt is A&Ox3; disoriented to time. NSR on telemetry with HR in 90s. No c/o headache, dizziness, SOB, chest pain, or N/V. Tele discontinued this afternoon per order. Wound care done. Pt and family updated on plan of care.    Safety and fall precautions remain in place. Purposeful rounding completed.          ASSESSMENT     Changes in assessment from patient's baseline this shift:    Neuro: No  CV: No  Pulm: No  Peripheral Vascular: No  HEENT: No  GI: No  BM during shift: No, Last BM: Last BM Date: 07/28/22  GU: No   Integ: No  MS: No    Pain: None  Pain Interventions: None  Medications Utilized: None    Mobility: PMP Activity: Step 3 - Bed Mobility of Distance Walked (ft) (Step 6,7): 0 Feet           Lines     Patient Lines/Drains/Airways Status       Active Lines, Drains and Airways       Name Placement date Placement time Site Days    Peripheral IV 07/24/22 20 G Anterior;Left Forearm 07/24/22  0030  Forearm  6    External Urinary Catheter 07/23/22  1602  --  6                         VITAL SIGNS     Vitals:    07/30/22 1133   BP: 131/79   Pulse: 89   Resp: 12   Temp: 97.5 F (36.4 C)   SpO2: 96%       Temp  Min: 97.3 F (36.3 C)  Max: 98.6 F (37 C)  Pulse  Min: 79  Max: 99  Resp  Min: 12  Max: 18  BP  Min: 109/70  Max: 139/72  SpO2  Min: 94 %  Max: 98 %    No intake or output data in the 24 hours ending 07/30/22 1144         CARE PLAN       Problem: Moderate/High Fall Risk Score >5  Goal: Patient will remain free of falls  Outcome: Progressing  Flowsheets (Taken 07/29/2022 1207)  VH High Risk (Greater than 13):   ALL REQUIRED LOW INTERVENTIONS   ALL REQUIRED MODERATE INTERVENTIONS   BED ALARM WILL BE ACTIVATED WHEN THE PATEINT IS IN BED WITH SIGNAGE "RESET BED ALARM"   Include family/significant other in multidisciplinary discussion regarding plan of care  as appropriate   Request PT/OT therapy consult order from physician for patients with gait/mobility impairment   Use of floor mat     Problem: Compromised Tissue integrity  Goal: Damaged tissue is healing and protected  Outcome: Progressing  Flowsheets (Taken 07/30/2022 1143)  Damaged tissue is healing and protected:   Monitor/assess Braden scale every shift   Reposition patient every 2 hours and as needed unless able to reposition self   Increase activity as tolerated/progressive mobility   Relieve pressure to bony prominences for patients at moderate and high risk   Use bath wipes, not soap and water, for daily bathing   Use incontinence wipes for cleaning urine, stool and caustic drainage. Foley care as needed   Avoid shearing  injuries   Keep intact skin clean and dry   Encourage use of lotion/moisturizer on skin   Monitor external devices/tubes for correct placement to prevent pressure, friction and shearing   Monitor patient's hygiene practices     Problem: Fluid and Electrolyte Imbalance/ Endocrine  Goal: Fluid and electrolyte balance are achieved/maintained  Outcome: Progressing  Flowsheets (Taken 07/30/2022 1143)  Fluid and electrolyte balance are achieved/maintained:   Monitor/assess lab values and report abnormal values   Assess and reassess fluid and electrolyte status   Observe for cardiac arrhythmias   Monitor for muscle weakness

## 2022-07-30 NOTE — Discharge Summary (Signed)
[facility]      Patient: Meredith Baker  Admission Date: 07/23/2022   DOB: 07/22/1946  Discharge Date: 07/30/2022    MRN: [id number]  Discharge Attending: A Delane Ginger, MD     Referring Physician: Pcp, None, MD  PCP: Pcp, None, MD       DISCHARGE SUMMARY     Discharge Information   Admission Diagnosis:   NSTEMI (non-ST elevated myocardial infarction)    Discharge Diagnosis:   Active Hospital Problems    Diagnosis    NSTEMI (non-ST elevated myocardial infarction)        Admission Condition: Guarded  Discharge Condition: Stable and improved  Consultants: Cardiology/pathology and gastroenterology  Functional Status: Bedbound  [facility] to: [facility]   Presentation History   76 years old female with history of hypertension, type 2 diabetes, hyperlipidemia, osteoarthritis, bedbound, history of bilateral DVT who presented for evaluation of generalized weakness presented for weakness and inability to care for herself admitted with a diagnosis of UTI and NSTEMI cardiology on board    See HPI for details.    Hospital Course (6 Days)     Anemia  Hemoglobin has been dropping from  From 12-10--8.2  I have ordered H&H monitor every 6 hours  Fecal occult blood test  Given patient has history of DVT I will continue the anticoagulation for now  Fecal occult blood test  Will monitor closely  No obvious bleeding noted  11/29: So far H&H stable between 8-9, continue heparin drip, pending hematology evaluation) final recommendation, fecal occult blood test pending, patient had 1 bowel movement yesterday which is brown, no obvious bleeding noted  11/30: H&H stayed stable, patient switched back to Pradaxa per heme-onc recommendation, fecal occult blood test came positive, GI consulted to weigh in, started on PPI  12/1-12/2: Discussed with GI, recommended outpatient EGD colonoscopy and Protonix 40 mg twice daily for a month followed by daily, H&H remained stable in fact uptrending plan communicated with the  patient as well as niece    Sepsis likely from UTI  Presented with leukocytosis and tachycardia  CT scan of the chest showed small left pleural effusion and evidence of pulmonary hypertension  UA consistent with UTI  Blood culture and urine culture came negative  Initially started on empirical broad-spectrum antibiotic vancomycin and Zosyn  MRSA test is negative  CT abdomen showed nonobstructive stone and small bladder stone  Antibiotic de-escalated to ceftriaxone  Leukocytosis improved  11/29-11/30: Patient will complete 5 days course of antibiotics today        History of DVT  Patient had bilateral lower extremity DVT back in September 2023 currently on Pradaxa  Repeat venous ultrasound showed left femoral popliteal DVT extending to the left peroneal vein and posterior tibialis vein with associated mild soft tissue swelling/edema and short segment nonocclusive thrombus within the right common femoral vein  I will involve hematology to guide on treatment  11/29: CTA ruled out PE, for now we will continue heparin drip, will switch her back to Lithium once hematology clears  11/30: Patient is a switch to Pradaxa after hematology cleared the patient  12/1-12/2: Patient tolerated Pradaxa, outpatient follow-up with hematology    NSTEMI  Elevated troponin  Likely demand ischemia  Troponin trended up from 90-108  Patient is on aspirin, statin and Pradaxa  Cardiology on board, recommended to get CTA  Follow-up recommendation  11/29-12/2: 2D echo showed normal EF, no LV wall motion abnormality, elevated troponin is  likely from demand ischemia and anemia, continue aspirin/statin/Coreg/losartan.  Outpatient follow-up with cardiology        Chronic stable medical condition  Hypertension: Continue amlodipine/metoprolol/losartan  11/29-12/2: Blood pressure within acceptable range, outpatient follow-up with PCP     Type 2 diabetes  Carb controlled diet  Sliding scale  Hypoglycemia protocol  12/1: Increase Lantus to 10, continue  sliding scale, and carb controlled diet.  :  Morbid obesity  Outpatient follow-up     Bedbound  Wound on her left iliac area  Wound nurse consult       Procedures/Imaging:   Upon my review: Negative except discussed above         Progress Note/Physical Exam at Discharge     Subjective:Patient is stable for discharge.    Vitals:    07/30/22 0729 07/30/22 0825 07/30/22 0847 07/30/22 0904   BP:  139/72  139/72   Pulse: 95 99  99   Resp:  16     Temp:  97.3 F (36.3 C)     TempSrc:  Oral     SpO2:  96%     Weight:   106 kg (233 lb 11 oz)    Height:               General: Chronically sick looking, NAD, AAOx3  HEENT: Sclera anicteric, no conjunctival injection, OP: Clear, MMM  Neck: Supple, FROM, no LAD  Cardiovascular: RRR, no m/r/g  Lungs: CTAB, no w/r/r  Abdomen: Soft, +BS, NT/ND, no masses, no g/r  Extremities: No C/C/E  Skin: No rashes or lesions noted  Neuro: Weak all over but able to follow commands and move all extremities       Diagnostics     Labs/Studies Pending at Discharge: Yes outpatient EGD and colonoscopy    Last Labs   Recent Labs   Lab 07/30/22  0324 07/29/22  0254 07/28/22  0908   WBC 9.76* 8.64 9.56*   RBC 3.04* 2.73* 2.84*   Hgb 9.3* 8.5* 8.8*   Hematocrit 30.3* 26.8* 28.2*   MCV 99.7* 98.2* 99.3*   Platelets 495* 481* 529*         Recent Labs   Lab 07/29/22  0254 07/28/22  0908 07/27/22  0455 07/26/22  0256 07/24/22  0638   Sodium 135 136 138 142 143   Potassium 4.4 4.5 4.1 3.4* 3.4*   Chloride 104 107 109 111 109   CO2 '26 23 21 24 24   '$ BUN 13.0 11.0 10.0 10.0 13.0   Creatinine 0.7 0.7 0.8 0.7 0.7   Glucose 284* 189* 243* 174* 175*   Calcium 8.7 8.5 8.3 7.9 8.6   Magnesium  --  1.6 1.8 1.0*  --          Microbiology Results (last 15 days)       Procedure Component Value Units Date/Time    MRSA culture [id number] Collected: 07/24/22 1932    Order Status: Completed Specimen: Culturette from Nasal Swab Updated: 07/26/22 0214     Culture MRSA Surveillance Negative for Methicillin Resistant Staph aureus     MRSA culture [id number] Collected: 07/24/22 1932    Order Status: Completed Specimen: Culturette from Throat Updated: 07/26/22 0214     Culture MRSA Surveillance Negative for Methicillin Resistant Staph aureus    Urine culture [id number] Collected: 07/24/22 1932    Order Status: Completed Specimen: Bladder Updated: 07/26/22 0413    Narrative:      ORDER#: [id number]  ORDERED BY: BRENNAN, IRINA  SOURCE: Urine                                        COLLECTED:  07/24/22 19:32  ANTIBIOTICS AT COLL.:                                RECEIVED :  07/24/22 20:10  Culture Urine                              FINAL       07/26/22 04:13  07/26/22   No growth of >1,000 CFU/ML, No further work      Culture Blood Aerobic and Anaerobic [id number] Collected: 07/23/22 2218    Order Status: Completed Specimen: Blood, Venipuncture Updated: 07/29/22 0421    Narrative:      The order will result in two separate 8-7m bottles  Please do NOT order repeat blood cultures if one has been  drawn within the last 48 hours  UNLESS concerned for  endocarditis  AVOID BLOOD CULTURE DRAWS FROM CENTRAL LINE IF POSSIBLE  Indications:->Sepsis  ORDER#: [id number]                                   ORDERED BY: BEnis Gash SOURCE: Blood, Venipuncture                          COLLECTED:  07/23/22 22:18  ANTIBIOTICS AT COLL.:                                RECEIVED :  07/24/22 01:42  Culture Blood Aerobic and Anaerobic        FINAL       07/29/22 04:21  07/29/22   No growth after 5 days of incubation.      Culture Blood Aerobic and Anaerobic [[id number]: 07/23/22 2218    Order Status: Completed Specimen: Blood, Venipuncture Updated: 07/29/22 0421    Narrative:      The order will result in two separate 8-17mbottles  Please do NOT order repeat blood cultures if one has been  drawn within the last 48 hours  UNLESS concerned for  endocarditis  AVOID BLOOD CULTURE DRAWS FROM CENTRAL LINE IF  POSSIBLE  Indications:->Sepsis  ORDER#: [id number]                                  ORDERED BY: BREnis GashSOURCE: Blood, Venipuncture                          COLLECTED:  07/23/22 22:18  ANTIBIOTICS AT COLL.:                                RECEIVED :  07/24/22 01:42  Culture Blood Aerobic and Anaerobic        FINAL       07/29/22 04:21  07/29/22   No growth after 5 days  of incubation.      COVID-19 (SARS-CoV-2) and Influenza A/B, NAA (Liat Rapid) [id number] Collected: 07/23/22 2218    Order Status: Completed Specimen: Culturette from Nasopharyngeal Updated: 07/23/22 2259     Purpose of COVID testing Diagnostic -PUI     SARS-CoV-2 Specimen Source Nasal Swab     SARS CoV 2 Overall Result Not Detected     Comment: __________________________________________________  -A result of "Detected" indicates POSITIVE for the    presence of SARS CoV-2 RNA  -A result of "Not Detected" indicates NEGATIVE for the    presence of SARS CoV-2 RNA  __________________________________________________________  Test performed using the Roche cobas Liat SARS-CoV-2 assay. This assay is  only for use under the Food and Drug Administration's Emergency Use  Authorization. This is a real-time RT-PCR assay for the qualitative  detection of SARS-CoV-2 RNA. Viral nucleic acids may persist in vivo,  independent of viability. Detection of viral nucleic acid does not imply the  presence of infectious virus, or that virus nucleic acid is the cause of  clinical symptoms. Negative results do not preclude SARS-CoV-2 infection and  should not be used as the sole basis for diagnosis, treatment or other  patient management decisions. Negative results must be combined with  clinical observations, patient history, and/or epidemiological information.  Invalid results may be due to inhibiting substances in the specimen and  recollection should occur. Please see Fact Sheets for patients and  providers  located:  https://www.benson-chung.com/          Influenza A Not Detected     Influenza B Not Detected     Comment: Test performed using the Roche cobas Liat SARS-CoV-2 & Influenza A/B assay.  This assay is only for use under the Food and Drug Administration's  Emergency Use Authorization. This is a multiplex real-time RT-PCR assay  intended for the simultaneous in vitro qualitative detection and  differentiation of SARS-CoV-2, influenza A, and influenza B virus RNA. Viral  nucleic acids may persist in vivo, independent of viability. Detection of  viral nucleic acid does not imply the presence of infectious virus, or that  virus nucleic acid is the cause of clinical symptoms. Negative results do  not preclude SARS-CoV-2, influenza A, and/or influenza B infection and  should not be used as the sole basis for diagnosis, treatment or other  patient management decisions. Negative results must be combined with  clinical observations, patient history, and/or epidemiological information.  Invalid results may be due to inhibiting substances in the specimen and  recollection should occur. Please see Fact Sheets for patients and providers  located: http://olson-hall.info/.         Narrative:      o Collect and clearly label specimen type:  o PREFERRED-Upper respiratory specimen: One Nasal Swab in  Transport Media.  o Hand deliver to laboratory ASAP  Diagnostic -PUI             Patient Instructions   Discharge Diet: Heart healthy diet  Discharge Activity: As tolerated    Follow Up Appointment:   Follow-up Information       West Carbo, MD Follow up in 1 week(s).    Specialties: Gastroenterology, Internal Medicine  Contact information:  [place] [address]  [phone]               Tia Alert, MD Follow up in 1 week(s).    Specialties: Medical Oncology, Hematology  Contact information:  [place]  [phone]  [place] [id number]  [id number]                Vonzell Schlatter, MD Follow up in 1 week(s).    Specialties: Interventional Cardiology, Internal Medicine, Cardiology  Contact information:  [place]  [place] [address]  [phone]               Pcp, None, MD .                              Discharge Medications:     Medication List        START taking these medications      aspirin 81 MG chewable tablet  Chew 1 tablet (81 mg) by mouth daily     atorvastatin 40 MG tablet  Commonly known as: LIPITOR  Take 1 tablet (40 mg) by mouth nightly     carboxymethylcellulose 0.5 % ophthalmic solution  Commonly known as: REFRESH TEARS  Place 1 drop into both eyes 3 (three) times daily as needed (dry eye)     insulin glargine 100 UNIT/ML injection  Commonly known as: LANTUS  Inject 10 Units into the skin nightly     pantoprazole 40 MG tablet  Commonly known as: PROTONIX  Take 1 tablet (40 mg) by mouth 2 (two) times daily for 30 days, THEN 1 tablet (40 mg) daily.  Start taking on: July 30, 2022            CONTINUE taking these medications      acetaminophen 500 MG tablet  Commonly known as: TYLENOL     CENTRUM SILVER 50+WOMEN PO     dabigatran 150 MG Caps  Commonly known as: PRADAXA     losartan 100 MG TABS, hydroCHLOROthiazide 25 MG TABS     metoprolol succinate 200 MG 24 hr tablet  Commonly known as: TOPROL-XL     TURMERIC PO     valACYclovir 1000 MG tablet  Commonly known as: VALTREX     vitamin D (ergocalciferol) 50000 UNIT Caps  Commonly known as: DRISDOL            STOP taking these medications      amLODIPine 5 MG tablet  Commonly known as: NORVASC     HUMULIN 70/30 KWIKPEN SC               Where to Get Your Medications        These medications were sent to Bartholomew Boards MD Isabel[place], MD - [address]  [address] [place] [address]      Phone: [phone]   aspirin 81 MG chewable tablet  atorvastatin 40 MG tablet  pantoprazole 40 MG tablet       Information about where to get these medications is not yet  available    Ask your nurse or doctor about these medications  carboxymethylcellulose 0.5 % ophthalmic solution  insulin glargine 100 UNIT/ML injection          Time spent examining patient, discussing with patient/family regarding hospital course, chart review, reconciling medications and discharge planning: 40 minutes.    Signed,  Arley Phenix, MD    10:56 AM 07/30/2022

## 2022-07-31 LAB — GLUCOSE WHOLE BLOOD - POCT
Whole Blood Glucose POCT: 178 mg/dL — ABNORMAL HIGH (ref 70–100)
Whole Blood Glucose POCT: 181 mg/dL — ABNORMAL HIGH (ref 70–100)
Whole Blood Glucose POCT: 184 mg/dL — ABNORMAL HIGH (ref 70–100)
Whole Blood Glucose POCT: 228 mg/dL — ABNORMAL HIGH (ref 70–100)

## 2022-07-31 NOTE — Discharge Summary (Signed)
[facility]      Patient: Meredith Baker  Admission Date: 07/23/2022   DOB: Apr 24, 1946  Discharge Date: 07/31/2022    MRN: [id number]  Discharge Attending: A Delane Ginger, MD     Referring Physician: Pcp, None, MD  PCP: Pcp, None, MD       DISCHARGE SUMMARY     Discharge Information   Admission Diagnosis:   NSTEMI (non-ST elevated myocardial infarction)    Discharge Diagnosis:   Active Hospital Problems    Diagnosis    NSTEMI (non-ST elevated myocardial infarction)        Admission Condition: Guarded  Discharge Condition: Stable and improved  Consultants: Cardiology/pathology and gastroenterology  Functional Status: Bedbound  Discharged to: [facility] Course   Presentation History   76 years old female with history of hypertension, type 2 diabetes, hyperlipidemia, osteoarthritis, bedbound, history of bilateral DVT who presented for evaluation of generalized weakness presented for weakness and inability to care for herself admitted with a diagnosis of UTI and NSTEMI cardiology on board    See HPI for details.    Hospital Course (7 Days)     Anemia  Hemoglobin has been dropping from  From 12-10--8.2  I have ordered H&H monitor every 6 hours  Fecal occult blood test  Given patient has history of DVT I will continue the anticoagulation for now  Fecal occult blood test  Will monitor closely  No obvious bleeding noted  11/29: So far H&H stable between 8-9, continue heparin drip, pending hematology evaluation) final recommendation, fecal occult blood test pending, patient had 1 bowel movement yesterday which is brown, no obvious bleeding noted  11/30: H&H stayed stable, patient switched back to Pradaxa per heme-onc recommendation, fecal occult blood test came positive, GI consulted to weigh in, started on PPI  12/1-12/3: Discussed with GI, recommended outpatient EGD colonoscopy and Protonix 40 mg twice daily for a month followed by daily, H&H remained stable in fact uptrending plan communicated with the  patient as well as niece    Sepsis likely from UTI  Presented with leukocytosis and tachycardia  CT scan of the chest showed small left pleural effusion and evidence of pulmonary hypertension  UA consistent with UTI  Blood culture and urine culture came negative  Initially started on empirical broad-spectrum antibiotic vancomycin and Zosyn  MRSA test is negative  CT abdomen showed nonobstructive stone and small bladder stone  Antibiotic de-escalated to ceftriaxone  Leukocytosis improved  11/29-11/30: Patient will complete 5 days course of antibiotics today        History of DVT  Patient had bilateral lower extremity DVT back in September 2023 currently on Pradaxa  Repeat venous ultrasound showed left femoral popliteal DVT extending to the left peroneal vein and posterior tibialis vein with associated mild soft tissue swelling/edema and short segment nonocclusive thrombus within the right common femoral vein  I will involve hematology to guide on treatment  11/29: CTA ruled out PE, for now we will continue heparin drip, will switch her back to [place] once hematology clears  11/30: Patient is a switch to Pradaxa after hematology cleared the patient  12/1-12/3: Patient tolerated Pradaxa, outpatient follow-up with hematology    NSTEMI  Elevated troponin  Likely demand ischemia  Troponin trended up from 90-108  Patient is on aspirin, statin and Pradaxa  Cardiology on board, recommended to get CTA  Follow-up recommendation  11/29-12/3: 2D echo showed normal EF, no LV wall motion abnormality, elevated troponin is  likely from demand ischemia and anemia, continue aspirin/statin/Coreg/losartan.  Outpatient follow-up with cardiology        Chronic stable medical condition  Hypertension: Continue amlodipine/metoprolol/losartan  11/29-12/3: Blood pressure within acceptable range, outpatient follow-up with PCP     Type 2 diabetes  Carb controlled diet  Sliding scale  Hypoglycemia protocol  12/1-12/3: Increase Lantus to 10, continue  sliding scale, and carb controlled diet.  :  Morbid obesity  Outpatient follow-up     Bedbound  Wound on her left iliac area  Wound nurse consult       Procedures/Imaging:   Upon my review: Negative except discussed above         Progress Note/Physical Exam at Discharge     Subjective:Patient is stable for discharge.    Vitals:    07/31/22 0726 07/31/22 0932 07/31/22 0933 07/31/22 1126   BP: 114/72 127/74 127/74 98/66   Pulse: 95 94 95 73   Resp: 16   16   Temp: 97.9 F (36.6 C)   97.9 F (36.6 C)   TempSrc: Oral   Axillary   SpO2: 97% 99%  99%   Weight: 102 kg (224 lb 13.9 oz)      Height:               General: Chronically sick looking, NAD, AAOx3  HEENT: Sclera anicteric, no conjunctival injection, OP: Clear, MMM  Neck: Supple, FROM, no LAD  Cardiovascular: RRR, no m/r/g  Lungs: CTAB, no w/r/r  Abdomen: Soft, +BS, NT/ND, no masses, no g/r  Extremities: No C/C/E  Skin: No rashes or lesions noted  Neuro: Weak all over but able to follow commands and move all extremities       Diagnostics     Labs/Studies Pending at Discharge: Yes outpatient EGD and colonoscopy    Last Labs   Recent Labs   Lab 07/30/22  0324 07/29/22  0254 07/28/22  0908   WBC 9.76* 8.64 9.56*   RBC 3.04* 2.73* 2.84*   Hgb 9.3* 8.5* 8.8*   Hematocrit 30.3* 26.8* 28.2*   MCV 99.7* 98.2* 99.3*   Platelets 495* 481* 529*         Recent Labs   Lab 07/29/22  0254 07/28/22  0908 07/27/22  0455 07/26/22  0256   Sodium 135 136 138 142   Potassium 4.4 4.5 4.1 3.4*   Chloride 104 107 109 111   CO2 '26 23 21 24   '$ BUN 13.0 11.0 10.0 10.0   Creatinine 0.7 0.7 0.8 0.7   Glucose 284* 189* 243* 174*   Calcium 8.7 8.5 8.3 7.9   Magnesium  --  1.6 1.8 1.0*         Microbiology Results (last 15 days)       Procedure Component Value Units Date/Time    MRSA culture [id number] Collected: 07/24/22 1932    Order Status: Completed Specimen: Culturette from Nasal Swab Updated: 07/26/22 0214     Culture MRSA Surveillance Negative for Methicillin Resistant Staph aureus     MRSA culture [id number] Collected: 07/24/22 1932    Order Status: Completed Specimen: Culturette from Throat Updated: 07/26/22 0214     Culture MRSA Surveillance Negative for Methicillin Resistant Staph aureus    Urine culture [id number] Collected: 07/24/22 1932    Order Status: Completed Specimen: Bladder Updated: 07/26/22 0413    Narrative:      ORDER#: [id number]  ORDERED BY: BRENNAN, IRINA  SOURCE: Urine                                        COLLECTED:  07/24/22 19:32  ANTIBIOTICS AT COLL.:                                RECEIVED :  07/24/22 20:10  Culture Urine                              FINAL       07/26/22 04:13  07/26/22   No growth of >1,000 CFU/ML, No further work      Culture Blood Aerobic and Anaerobic [id number] Collected: 07/23/22 2218    Order Status: Completed Specimen: Blood, Venipuncture Updated: 07/29/22 0421    Narrative:      The order will result in two separate 8-34m bottles  Please do NOT order repeat blood cultures if one has been  drawn within the last 48 hours  UNLESS concerned for  endocarditis  AVOID BLOOD CULTURE DRAWS FROM CENTRAL LINE IF POSSIBLE  Indications:->Sepsis  ORDER#: [id number]                                   ORDERED BY: BEnis Gash SOURCE: Blood, Venipuncture                          COLLECTED:  07/23/22 22:18  ANTIBIOTICS AT COLL.:                                RECEIVED :  07/24/22 01:42  Culture Blood Aerobic and Anaerobic        FINAL       07/29/22 04:21  07/29/22   No growth after 5 days of incubation.      Culture Blood Aerobic and Anaerobic [[id number]: 07/23/22 2218    Order Status: Completed Specimen: Blood, Venipuncture Updated: 07/29/22 0421    Narrative:      The order will result in two separate 8-144mbottles  Please do NOT order repeat blood cultures if one has been  drawn within the last 48 hours  UNLESS concerned for  endocarditis  AVOID BLOOD CULTURE DRAWS FROM CENTRAL LINE IF  POSSIBLE  Indications:->Sepsis  ORDER#: [id number]                                  ORDERED BY: BREnis GashSOURCE: Blood, Venipuncture                          COLLECTED:  07/23/22 22:18  ANTIBIOTICS AT COLL.:                                RECEIVED :  07/24/22 01:42  Culture Blood Aerobic and Anaerobic        FINAL       07/29/22 04:21  07/29/22   No growth after 5 days  of incubation.      COVID-19 (SARS-CoV-2) and Influenza A/B, NAA (Liat Rapid) [id number] Collected: 07/23/22 2218    Order Status: Completed Specimen: Culturette from Nasopharyngeal Updated: 07/23/22 2259     Purpose of COVID testing Diagnostic -PUI     SARS-CoV-2 Specimen Source Nasal Swab     SARS CoV 2 Overall Result Not Detected     Comment: __________________________________________________  -A result of "Detected" indicates POSITIVE for the    presence of SARS CoV-2 RNA  -A result of "Not Detected" indicates NEGATIVE for the    presence of SARS CoV-2 RNA  __________________________________________________________  Test performed using the Roche cobas Liat SARS-CoV-2 assay. This assay is  only for use under the Food and Drug Administration's Emergency Use  Authorization. This is a real-time RT-PCR assay for the qualitative  detection of SARS-CoV-2 RNA. Viral nucleic acids may persist in vivo,  independent of viability. Detection of viral nucleic acid does not imply the  presence of infectious virus, or that virus nucleic acid is the cause of  clinical symptoms. Negative results do not preclude SARS-CoV-2 infection and  should not be used as the sole basis for diagnosis, treatment or other  patient management decisions. Negative results must be combined with  clinical observations, patient history, and/or epidemiological information.  Invalid results may be due to inhibiting substances in the specimen and  recollection should occur. Please see Fact Sheets for patients and  providers  located:  https://www.benson-chung.com/          Influenza A Not Detected     Influenza B Not Detected     Comment: Test performed using the Roche cobas Liat SARS-CoV-2 & Influenza A/B assay.  This assay is only for use under the Food and Drug Administration's  Emergency Use Authorization. This is a multiplex real-time RT-PCR assay  intended for the simultaneous in vitro qualitative detection and  differentiation of SARS-CoV-2, influenza A, and influenza B virus RNA. Viral  nucleic acids may persist in vivo, independent of viability. Detection of  viral nucleic acid does not imply the presence of infectious virus, or that  virus nucleic acid is the cause of clinical symptoms. Negative results do  not preclude SARS-CoV-2, influenza A, and/or influenza B infection and  should not be used as the sole basis for diagnosis, treatment or other  patient management decisions. Negative results must be combined with  clinical observations, patient history, and/or epidemiological information.  Invalid results may be due to inhibiting substances in the specimen and  recollection should occur. Please see Fact Sheets for patients and providers  located: http://olson-hall.info/.         Narrative:      o Collect and clearly label specimen type:  o PREFERRED-Upper respiratory specimen: One Nasal Swab in  Transport Media.  o Hand deliver to laboratory ASAP  Diagnostic -PUI             Patient Instructions   Discharge Diet: Heart healthy diet  Discharge Activity: As tolerated    Follow Up Appointment:   Follow-up Information       West Carbo, MD Follow up in 1 week(s).    Specialties: Gastroenterology, Internal Medicine  Contact information:  [place] [address]  [phone]               Tia Alert, MD Follow up in 1 week(s).    Specialties: Medical Oncology, Hematology  Contact information:  [place]  [phone]  [place] [address]  [phone]                Vonzell Schlatter, MD Follow up in 1 week(s).    Specialties: Interventional Cardiology, Internal Medicine, Cardiology  Contact information:  [address]  [place] [address]  [phone]               Pcp, None, MD .                              Discharge Medications:     Medication List        START taking these medications      aspirin 81 MG chewable tablet  Chew 1 tablet (81 mg) by mouth daily     atorvastatin 40 MG tablet  Commonly known as: LIPITOR  Take 1 tablet (40 mg) by mouth nightly     carboxymethylcellulose 0.5 % ophthalmic solution  Commonly known as: REFRESH TEARS  Place 1 drop into both eyes 3 (three) times daily as needed (dry eye)     insulin glargine 100 UNIT/ML injection  Commonly known as: LANTUS  Inject 10 Units into the skin nightly     pantoprazole 40 MG tablet  Commonly known as: PROTONIX  Take 1 tablet (40 mg) by mouth 2 (two) times daily for 30 days, THEN 1 tablet (40 mg) daily.  Start taking on: July 30, 2022            CONTINUE taking these medications      acetaminophen 500 MG tablet  Commonly known as: TYLENOL     CENTRUM SILVER 50+WOMEN PO     dabigatran 150 MG Caps  Commonly known as: PRADAXA     losartan 100 MG TABS, hydroCHLOROthiazide 25 MG TABS     metoprolol succinate 200 MG 24 hr tablet  Commonly known as: TOPROL-XL     TURMERIC PO     valACYclovir 1000 MG tablet  Commonly known as: VALTREX     vitamin D (ergocalciferol) 50000 UNIT Caps  Commonly known as: DRISDOL            STOP taking these medications      amLODIPine 5 MG tablet  Commonly known as: NORVASC     HUMULIN 70/30 KWIKPEN SC               Where to Get Your Medications        These medications were sent to [facility], MD - [address]  [address] [place] [address]      Phone: [phone]   aspirin 81 MG chewable tablet  atorvastatin 40 MG tablet  pantoprazole 40 MG tablet       Information about where to get these medications is not yet  available    Ask your nurse or doctor about these medications  carboxymethylcellulose 0.5 % ophthalmic solution  insulin glargine 100 UNIT/ML injection          Time spent examining patient, discussing with patient/family regarding hospital course, chart review, reconciling medications and discharge planning: 40 minutes.    Signed,  Arley Phenix, MD    11:47 AM 07/31/2022

## 2022-07-31 NOTE — Plan of Care (Signed)
NURSING SHIFT NOTE     Patient: Meredith Baker  Day: 7      SHIFT EVENTS     Shift Narrative/Significant Events (PRN med administration, fall, RRT, etc.):     Patient alerted x4. VSS in no respiratory distress or pain. No c/o of n/v. No significant shift events. Will continue to monitor patient progression and implement plan of care   Safety and fall precautions remain in place. Purposeful rounding completed.          ASSESSMENT     Changes in assessment from patient's baseline this shift:    Neuro: No  CV: No  Pulm: No  Peripheral Vascular: No  HEENT: No  GI: No  BM during shift: No, Last BM: Last BM Date: 07/28/22  GU: No   Integ: No  MS: No    Pain: Improved  Pain Interventions: Rest  Medications Utilized: gabapentin    Mobility: PMP Activity: Step 3 - Bed Mobility of Distance Walked (ft) (Step 6,7): 0 Feet           Lines     Patient Lines/Drains/Airways Status       Active Lines, Drains and Airways       Name Placement date Placement time Site Days    Peripheral IV 07/24/22 20 G Anterior;Left Forearm 07/24/22  0030  Forearm  7    External Urinary Catheter 07/23/22  1602  --  7                         VITAL SIGNS     Vitals:    07/30/22 2302   BP: 130/75   Pulse: 78   Resp: 18   Temp: 98.4 F (36.9 C)   SpO2: (!) 76%       Temp  Min: 97.3 F (36.3 C)  Max: 98.4 F (36.9 C)  Pulse  Min: 78  Max: 99  Resp  Min: 12  Max: 18  BP  Min: 118/72  Max: 145/73  SpO2  Min: 76 %  Max: 99 %      Intake/Output Summary (Last 24 hours) at 07/31/2022 0217  Last data filed at 07/30/2022 1400  Gross per 24 hour   Intake 200 ml   Output 650 ml   Net -450 ml                  CARE PLAN       Problem: Compromised Tissue integrity  Goal: Nutritional status is improving  Outcome: Progressing  Flowsheets (Taken 07/30/2022 2000)  Nutritional status is improving: Allow adequate time for meals     Problem: Safety  Goal: Patient will be free from injury during hospitalization  Outcome: Progressing  Flowsheets (Taken 07/27/2022 2059 by  Elodia Florence, RN)  Patient will be free from injury during hospitalization:   Assess patient's risk for falls and implement fall prevention plan of care per policy   Provide and maintain safe environment   [place] rounding

## 2022-07-31 NOTE — Plan of Care (Signed)
Problem: Pain interferes with ability to perform ADL  Goal: Pain at adequate level as identified by patient  07/31/2022 1221 by Idelia Salm, RN  Outcome: Progressing  Flowsheets (Taken 07/27/2022 1952 by Brandon Melnick, RN)  Pain at adequate level as identified by patient:   Identify patient comfort function goal   Assess for risk of opioid induced respiratory depression, including snoring/sleep apnea. Alert healthcare team of risk factors identified.   Reassess pain within 30-60 minutes of any procedure/intervention, per Pain Assessment, Intervention, Reassessment (AIR) Cycle   Assess pain on admission, during daily assessment and/or before any "as needed" intervention(s)  07/31/2022 1218 by Idelia Salm, [place] (Taken 07/27/2022 1952 by Brandon Melnick, RN)  Pain at adequate level as identified by patient:   Identify patient comfort function goal   Assess for risk of opioid induced respiratory depression, including snoring/sleep apnea. Alert healthcare team of risk factors identified.   Reassess pain within 30-60 minutes of any procedure/intervention, per Pain Assessment, Intervention, Reassessment (AIR) Cycle   Assess pain on admission, during daily assessment and/or before any "as needed" intervention(s)     Problem: Moderate/High Fall Risk Score >5  Goal: Patient will remain free of falls  07/31/2022 1221 by Idelia Salm, RN  Outcome: Progressing  07/31/2022 1218 by Idelia Salm, RN  Flowsheets  Taken 07/30/2022 2000 by Erlinda Hong, RN  High (Greater than 13): HIGH-Initiate use of floor mats as appropriate  Taken 07/29/2022 1207 by Mechele Dawley, RN  VH High Risk (Greater than 13):   ALL REQUIRED LOW INTERVENTIONS   ALL REQUIRED MODERATE INTERVENTIONS   BED ALARM WILL BE ACTIVATED WHEN THE PATEINT IS IN BED WITH SIGNAGE "RESET BED ALARM"   Include family/significant other in multidisciplinary discussion regarding plan of care as appropriate   Request PT/OT therapy consult order from physician  for patients with gait/mobility impairment   Use of floor mat     Problem: Compromised Tissue integrity  Goal: Damaged tissue is healing and protected  07/31/2022 1221 by Idelia Salm, RN  Outcome: Progressing  Flowsheets (Taken 07/31/2022 0800)  Damaged tissue is healing and protected: Monitor/assess Braden scale every shift  07/31/2022 1218 by Idelia Salm, RN  Flowsheets (Taken 07/31/2022 0800)  Damaged tissue is healing and protected: Monitor/assess Braden scale every shift  Goal: Nutritional status is improving  Outcome: Progressing     Problem: Safety  Goal: Patient will be free from injury during hospitalization  07/31/2022 1221 by Idelia Salm, RN  Outcome: [place] (Taken 07/27/2022 2059 by Elodia Florence, RN)  Patient will be free from injury during hospitalization:   Assess patient's risk for falls and implement fall prevention plan of care per policy   Provide and maintain safe environment   Hourly rounding  07/31/2022 1218 by Idelia Salm, [place] (Taken 07/27/2022 2059 by Elodia Florence, RN)  Patient will be free from injury during hospitalization:   Assess patient's risk for falls and implement fall prevention plan of care per policy   Provide and maintain safe environment   Hourly rounding  Goal: Patient will be free from infection during hospitalization  Outcome: Progressing     Problem: Fluid and Electrolyte Imbalance/ Endocrine  Goal: Fluid and electrolyte balance are achieved/maintained  07/31/2022 1221 by Idelia Salm, RN  Outcome: Progressing  07/31/2022 1218 by Idelia Salm, RN  Flowsheets (Taken 07/30/2022 1143 by Mechele Dawley, RN)  Fluid and electrolyte balance are achieved/maintained:   Monitor/assess lab values and report abnormal values  Assess and reassess fluid and electrolyte status   Observe for cardiac arrhythmias   Monitor for muscle weakness  Goal: Adequate hydration  Outcome: Progressing  Flowsheets (Taken 07/25/2022 0141 by Azell Der, RN)  Adequate hydration:   Assess mucus membranes, skin color, turgor, perfusion and presence of edema   Assess for peripheral, sacral, periorbital and abdominal edema   Monitor and assess vital signs and perfusion     Problem: Everyday - Pneumonia  Goal: Stable Vital Signs and Fluid Balance  Outcome: Progressing  Flowsheets (Taken 07/29/2022 1207 by Mechele Dawley, RN)  Stable Vital Signs and Fluid Balance:   Monitor/ assess vital signs and telemetry per policy   Monitor labs and report abnormalities to physicians  Goal: Mobility/Activity is Maintained at Optimal Level for Patient  Outcome: Progressing

## 2022-07-31 NOTE — Progress Notes (Addendum)
See previous CM note regarding SNF auth denial.  CM called niece Ms. Willis  [phone] and left voicemail message.          1:25 pm :Cm spoke to niece Ms. Willis who voiced that she is the only support ,lives an hr away and has not been able to find assistance for patient to return home with prior arrangements. Niece voiced that she filled out LTC paperwork for [facility], but did not submit to facility. No paperwork noted in chart.  CM called [place]  # [phone] ( [facility] ) and left voicemail message.  Ms. Jannifer Franklin e-mail : [email or web address]

## 2022-08-01 LAB — GLUCOSE WHOLE BLOOD - POCT
Whole Blood Glucose POCT: 167 mg/dL — ABNORMAL HIGH (ref 70–100)
Whole Blood Glucose POCT: 223 mg/dL — ABNORMAL HIGH (ref 70–100)
Whole Blood Glucose POCT: 289 mg/dL — ABNORMAL HIGH (ref 70–100)
Whole Blood Glucose POCT: 268 mg/dL — ABNORMAL HIGH (ref 70–100)

## 2022-08-01 LAB — CBC AND DIFFERENTIAL
Absolute NRBC: 0 10*3/uL (ref 0.00–0.00)
Basophils Absolute Automated: 0.06 10*3/uL (ref 0.00–0.08)
Basophils Automated: 0.6 %
Eosinophils Absolute Automated: 0.36 10*3/uL (ref 0.00–0.44)
Eosinophils Automated: 3.6 %
Hematocrit: 28.2 % — ABNORMAL LOW (ref 34.7–43.7)
Hgb: 8.8 g/dL — ABNORMAL LOW (ref 11.4–14.8)
Immature Granulocytes Absolute: 0.1 10*3/uL — ABNORMAL HIGH (ref 0.00–0.07)
Immature Granulocytes: 1 %
Instrument Absolute Neutrophil Count: 7.04 10*3/uL — ABNORMAL HIGH (ref 1.10–6.33)
Lymphocytes Absolute Automated: 1.58 10*3/uL (ref 0.42–3.22)
Lymphocytes Automated: 15.9 %
MCH: 31.7 pg (ref 25.1–33.5)
MCHC: 31.2 g/dL — ABNORMAL LOW (ref 31.5–35.8)
MCV: 101.4 fL — ABNORMAL HIGH (ref 78.0–96.0)
MPV: 9.9 fL (ref 8.9–12.5)
Monocytes Absolute Automated: 0.82 10*3/uL (ref 0.21–0.85)
Monocytes: 8.2 %
Neutrophils Absolute: 7.04 10*3/uL — ABNORMAL HIGH (ref 1.10–6.33)
Neutrophils: 70.7 %
Nucleated RBC: 0 /100 WBC (ref 0.0–0.0)
Platelets: 550 10*3/uL — ABNORMAL HIGH (ref 142–346)
RBC: 2.78 10*6/uL — ABNORMAL LOW (ref 3.90–5.10)
RDW: 19 % — ABNORMAL HIGH (ref 11–15)
WBC: 9.96 10*3/uL — ABNORMAL HIGH (ref 3.10–9.50)

## 2022-08-01 NOTE — Progress Notes (Addendum)
CM called  niece Ms. Willis who confirmed that she will drop off the LTC forms at  [facility] by 2 :00 pm. Daughter to fill out [employer] application.CM called [place] ( admissions) # [phone] and confirmed.

## 2022-08-01 NOTE — Progress Notes (Signed)
CARDIOLOGY PROGRESS NOTE    Date Time: 08/01/22 1:16 PM  Patient Name: Meredith Baker, Meredith Baker      Assessment:     Mildly elevated HS troponin I level, likely demand ischemia; chest pain-free  Abnormal EKG; right bundle branch block and left anterior fascicular block  Coronary calcification, seen on CTA chest; suggestive of CAD  Normal LV systolic function, with EF 60 to 65%  Mild pulmonic, mitral, and tricuspid regurgitation   Hypertension, hypertensive cardiovascular disease  Diabetes mellitus, A1c 6.0%  Hx of DVT/PE  Arthritis and being bed bound  Left lower extremity laterally rotated; 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 once a day  Atorvastatin 40 mg p.o. once a day  Carvedilol 12.5 mg p.o. twice a day  Losartan 100 mg p.o. once a day  Hydrochlorothiazide 25 mg p.o. once a day  On Pradaxa 150 mg p.o. twice a day, for hx of DVT  CBC and BMP level  Out of bed and sit in chair  Work-up for CAD once medically stable, can be done as outpatient    Subjective/chief complaint:   Patient is seen and examined  All medications and labs reviewed    Patient appears fatigued. She denies chest pain or shortness of breath.     Echocardiogram ([id number]): LV systolic function is normal with an EF of 60 to 65%.  Severe concentric LVH.  Grade I LV diastolic dysfunction. Left atrium is moderately dilated.  Mild pulmonic regurgitation.  Mild mitral regurgitation. Mild tricuspid regurgitation. Mild pulmonary hypertension with estimated right ventricular systolic pressure of 34 mmHg.      Medications:     Current Facility-Administered Medications   Medication Dose Route Frequency    aspirin  81 mg Oral Daily    atorvastatin  40 mg Oral QHS    balsam peru-castor oil (VENELEX)   Topical Q12H    carvedilol  12.5 mg Oral Q12H SCH    dabigatran  150 mg Oral Q12H SCH    gabapentin  200 mg Oral Q8H Secretary    insulin glargine  8 Units Subcutaneous QHS    insulin lispro  1-4 Units Subcutaneous QHS    insulin  lispro  1-8 Units Subcutaneous TID AC    losartan (COZAAR) 100 mg, hydroCHLOROthiazide (HYDRODIURIL) 25 mg for HYZAAR   Oral Daily    miconazole 2 % with zinc oxide   Topical Q12H    pantoprazole  40 mg Oral BID    polyethylene glycol  17 g Oral Daily    senna-docusate  1 tablet Oral QHS       Review of Systems:   General ROS: no weight loss, no weight gain, no fever, no chills  Hematological and Lymphatic ROS: negative  Endocrine ROS: no fatigue, no polyuria, no polydipsia, no general weakness  Respiratory ROS: no shortness of breath, no cough, no wheezes and no hemoptysis  Cardiovascular ROS: no chest pain, no palpitations, no PND and no orthopnea, no DOE  Gastrointestinal ROS: no nausea, no vomiting, no diarrhea, no constipation, no blood in stool and no abdominal pain  Musculoskeletal ROS: no muscle pain, no muscle weakness, no joint pain, no swelling and no redness  Neurological ROS: no headache, no dizziness, no diplopia, no focal weakness, no seizure  Dermatological ROS: no rash, no itching and no ecchymoses, no pressure ulcer      Physical Exam:   BP 100/63   Pulse 91   Temp 98.1  F (36.7 C) (Oral)   Resp 16   Ht 1.575 m ('5\' 2"'$ )   Wt 104.7 kg (230 lb 13.2 oz)   SpO2 99%   BMI 42.22 kg/m       Intake/Output Summary (Last 24 hours) at 08/01/2022 1316  Last data filed at 08/01/2022 0900  Gross per 24 hour   Intake 120 ml   Output --   Net 120 ml         General appearance - alert, ill appearing, and in no distress  Mental status - alert, oriented to person, place, and time  HEENT:normocephalic, no icterus, no pallor, no cyanosis  Neck: supple, no JVD, no thyromegaly, no carotid bruits  Chest - clear to auscultation, no wheezes, rales or rhonchi, symmetric air entry  Heart - normal rate, regular rhythm, normal S1, S2, no S3 ,+murmurs, rubs, clicks or gallops  Abdomen - soft, nontender, nondistended, no masses or organomegaly  Neurological - alert, oriented, normal speech, no focal findings   noted  Musculoskeletal - no joint tenderness, no significant deformity   Extremities - peripheral pulses palpable, trace pedal edema, no clubbing or cyanosis  Skin - normal color, no rashes.    Labs:     CBC w/Diff   Recent Labs   Lab 08/01/22  0312 07/30/22  0324 07/29/22  0254   WBC 9.96* 9.76* 8.64   Hgb 8.8* 9.3* 8.5*   Hematocrit 28.2* 30.3* 26.8*   Platelets 550* 495* 481*            Basic Metabolic Profile   Recent Labs   Lab 07/29/22  0254 07/28/22  0908 07/27/22  0455   Sodium 135 136 138   Potassium 4.4 4.5 4.1   Chloride 104 107 109   CO2 '26 23 21   '$ BUN 13.0 11.0 10.0   Creatinine 0.7 0.7 0.8   Calcium 8.7 8.5 8.3   ALT  --   --  6   AST (SGOT)  --   --  10   Glucose 284* 189* 243*            Cardiac Enzymes           Lab Results   Component Value Date    BNP 4,800 (H) 07/26/2022          Thyroid Studies          Invalid input(s): "FREET4"          Lipid Profile   Recent Labs   Lab 07/26/22  0256   Cholesterol 92            Radiology Results (24 Hour)       ** No results found for the last 24 hours. **            Discussed with Vonzell Schlatter, MD.      Eustacia Urbanek S Thang Flett, PA, PA-C  08/01/2022 1:16 PM

## 2022-08-01 NOTE — Progress Notes (Signed)
Admitted to Unit 25, unit 2503A. Patient is alert and oriented, able to follow commands. Initial assessment and vital signs taken and recorded.Attending physician made aware of admission. Place call bell within reach safety measures observed. Admission notes completed and patient able to answer question. On CIWA protocol and monitored closely,     Oriented to the unit and provided and explain the plan of care. TELE box placed as ordered.       Personal belongings are kept at patient's drawer at bedside and no patient's own medication brought.     Noted admissions order and carried out, TRIO rounds done with attending physician, explained the plan of care to the patient and verbalized understanding. Regular medication started and manage the pain as patient complain of headache;   1x Tylenol tablet as PRN   1x IV Morphine as ordered; reassess patient and documented.     CIWA monitored as per protocol and documented, patient explain the plan of care and verbali ze understanding.

## 2022-08-01 NOTE — Plan of Care (Addendum)
NURSING SHIFT NOTE     Patient: Meredith Baker  Day: 8      SHIFT EVENTS     Shift Narrative/Significant Events (PRN med administration, fall, RRT, etc.):     Pt alert andoriented x3, disoriented to time. Pt RA and IV clean and intact. Pt denies chest pain and SOB. Safety and fall precautions remain in place. Purposeful rounding completed.          ASSESSMENT     Changes in assessment from patient's baseline this shift:    Neuro: No  CV: No  Pulm: No  Peripheral Vascular: No  HEENT: No  GI: No  BM during shift: No, Last BM: Last BM Date: 07/31/22  GU: No   Integ: No  MS: No    Pain: None  Pain Interventions: N/A  Medications Utilized: N/A    Mobility: PMP Activity: Step 3 - Bed Mobility of Distance Walked (ft) (Step 6,7): 0 Feet           Lines     Patient Lines/Drains/Airways Status       Active Lines, Drains and Airways       Name Placement date Placement time Site Days    Peripheral IV 07/24/22 20 G Anterior;Left Forearm 07/24/22  0030  Forearm  8    External Urinary Catheter 07/23/22  1602  --  8                         VITAL SIGNS     Vitals:    08/01/22 1057   BP: 100/63   Pulse: 91   Resp: 16   Temp: 98.1 F (36.7 C)   SpO2: 99%       Temp  Min: 97.9 F (36.6 C)  Max: 98.2 F (36.8 C)  Pulse  Min: 91  Max: 177  Resp  Min: 14  Max: 18  BP  Min: 93/60  Max: 128/79  SpO2  Min: 97 %  Max: 100 %      Intake/Output Summary (Last 24 hours) at 08/01/2022 1410  Last data filed at 08/01/2022 0900  Gross per 24 hour   Intake 120 ml   Output --   Net 120 ml            CARE PLAN         Problem: Pain interferes with ability to perform ADL  Goal: Pain at adequate level as identified by patient  Outcome: Progressing  Flowsheets (Taken 07/27/2022 1952 by Brandon Melnick, RN)  Pain at adequate level as identified by patient:   Identify patient comfort function goal   Assess for risk of opioid induced respiratory depression, including snoring/sleep apnea. Alert healthcare team of risk factors identified.   Reassess pain within  30-60 minutes of any procedure/intervention, per Pain Assessment, Intervention, Reassessment (AIR) Cycle   Assess pain on admission, during daily assessment and/or before any "as needed" intervention(s)     Problem: Side Effects from Pain Analgesia  Goal: Patient will experience minimal side effects of analgesic therapy  Outcome: Progressing  Flowsheets (Taken 07/24/2022 0536 by Azell Der, RN)  Patient will experience minimal side effects of analgesic therapy:   Monitor/assess patient's respiratory status (RR depth, effort, breath sounds)   Prevent/manage side effects per LIP orders (i.e. nausea, vomiting, pruritus, constipation, urinary retention, etc.)   Assess for changes in cognitive function   Evaluate for opioid-induced sedation with appropriate assessment tool (i.e. POSS)  Problem: Moderate/High Fall Risk Score >5  Goal: Patient will remain free of falls  Outcome: Progressing  Flowsheets (Taken 08/01/2022 0745)  High (Greater than 13):   HIGH-Consider use of low bed   HIGH-Initiate use of floor mats as appropriate   HIGH-Apply yellow "Fall Risk" arm band   HIGH-Bed alarm on at all times while patient in bed     Problem: Compromised Tissue integrity  Goal: Damaged tissue is healing and protected  Outcome: Progressing  Flowsheets (Taken 08/01/2022 0745)  Damaged tissue is healing and protected:   Monitor/assess Braden scale every shift   Provide wound care per wound care algorithm   Reposition patient every 2 hours and as needed unless able to reposition self   Avoid shearing injuries  Goal: Nutritional status is improving  Outcome: Progressing  Flowsheets (Taken 08/01/2022 0745)  Nutritional status is improving: Allow adequate time for meals     Problem: Safety  Goal: Patient will be free from injury during hospitalization  Outcome: Progressing  Flowsheets (Taken 08/01/2022 0514 by Derrill Memo, RN)  Patient will be free from injury during hospitalization:   Use appropriate transfer methods   Assess  patient's risk for falls and implement fall prevention plan of care per policy   Include patient/ family/ care giver in decisions related to safety   Provide alternative method of communication if needed (communication boards, writing)   Hourly rounding   Assess for patients risk for elopement and implement Elopement Risk Plan per policy   Ensure appropriate safety devices are available at the bedside   Provide and maintain safe environment  Goal: Patient will be free from infection during hospitalization  Outcome: Progressing  Flowsheets (Taken 07/25/2022 0141 by Azell Der, RN)  Free from Infection during hospitalization:   Assess and monitor for signs and symptoms of infection   Encourage patient and family to use good hand hygiene technique   Monitor lab/diagnostic results   Monitor all insertion sites (i.e. indwelling lines, tubes, urinary catheters, and drains)     Problem: Fluid and Electrolyte Imbalance/ Endocrine  Goal: Fluid and electrolyte balance are achieved/maintained  Outcome: Progressing  Flowsheets (Taken 08/01/2022 0514 by Derrill Memo, RN)  Fluid and electrolyte balance are achieved/maintained: Monitor/assess lab values and report abnormal values  Goal: Adequate hydration  Outcome: Progressing  Flowsheets (Taken 07/25/2022 0141 by Azell Der, RN)  Adequate hydration:   Assess mucus membranes, skin color, turgor, perfusion and presence of edema   Assess for peripheral, sacral, periorbital and abdominal edema   Monitor and assess vital signs and perfusion     Problem: Day of Admission - Pneumonia  Goal: Pneumonia Admission  Outcome: Progressing  Flowsheets (Taken 07/25/2022 0141 by Azell Der, RN)  Pneumonia Admission:   Standing Weight on admission. If unable to stand-zero the bed and use the bed scale   Assess and document vaccination status   Educate patient and caregiver on  PNA, incentive spirometer, oral care, mobility and review 90 day commitment care([facility] only)    Dysphagia screening if ordered by provider   Vital signs and telemetry per policy     Problem: Everyday - Pneumonia  Goal: Stable Vital Signs and Fluid Balance  Outcome: Progressing  Flowsheets (Taken 07/29/2022 1207 by Mechele Dawley, RN)  Stable Vital Signs and Fluid Balance:   Monitor/ assess vital signs and telemetry per policy   Monitor labs and report abnormalities to physicians  Goal: Mobility/Activity is Maintained at Optimal Level for Patient  Outcome: Progressing  Flowsheets (Taken 07/25/2022 0141 by Azell Der, RN)  Mobility/Activity is Maintained at Optimal Level for Patient:   Increase mobility as tolerated using Dangle/Stand/Walk and progressive mobility   Reposition patient every 2 hours and as needed unless able to reposition self   Perform active/passive ROM     Problem: Hemodynamic Status: Cardiac  Goal: Stable vital signs and fluid balance  Outcome: Progressing  Flowsheets (Taken 07/26/2022 2107 by Elodia Florence, RN)  Stable vital signs and fluid balance:   Assess signs and symptoms associated with cardiac rhythm changes   Monitor lab values     Problem: Diabetes: Glucose Imbalance  Goal: Blood glucose stable at established goal  Outcome: Progressing  Flowsheets (Taken 07/28/2022 2011 by Elodia Florence, RN)  Blood glucose stable at established goal:   Monitor lab values   Assess for hypoglycemia /hyperglycemia   Ensure appropriate consults are obtained (Nutrition, Diabetes Education, and Case Management)

## 2022-08-01 NOTE — Plan of Care (Signed)
NURSING SHIFT NOTE     Patient: Meredith Baker  Day: 8      SHIFT EVENTS     Shift Narrative/Significant Events (PRN med administration, fall, RRT, etc.):     Patient is Alert and Oriented x3. Patient denies of any pain, dizziness and SOB. Fall precautions in place. Patient is total care. No acute distress noted. Safety measures continue. Bed is locked and in lowest setting. Continue to monitor.     Safety and fall precautions remain in place. Purposeful rounding completed.          ASSESSMENT     Changes in assessment from patient's baseline this shift:    Neuro: No  CV: No  Pulm: No  Peripheral Vascular: No  HEENT: No  GI: No  BM during shift: No, Last BM: Last BM Date: 07/31/22  GU: No   Integ: No  MS: No    Pain: None  Pain Interventions:   Medications Utilized:     Mobility: PMP Activity: Step 3 - Bed Mobility of Distance Walked (ft) (Step 6,7): 0 Feet           Lines     Patient Lines/Drains/Airways Status       Active Lines, Drains and Airways       Name Placement date Placement time Site Days    Peripheral IV 07/24/22 20 G Anterior;Left Forearm 07/24/22  0030  Forearm  8    External Urinary Catheter 07/23/22  1602  --  8                         VITAL SIGNS     Vitals:    08/01/22 0314   BP: 93/58   Pulse: 94   Resp: 18   Temp: 97.9 F (36.6 C)   SpO2: 97%       Temp  Min: 97.9 F (36.6 C)  Max: 98.2 F (36.8 C)  Pulse  Min: 73  Max: 177  Resp  Min: 16  Max: 18  BP  Min: 93/58  Max: 128/79  SpO2  Min: 97 %  Max: 100 %      Intake/Output Summary (Last 24 hours) at 08/01/2022 0515  Last data filed at 07/31/2022 0933  Gross per 24 hour   Intake 250 ml   Output --   Net 250 ml                CARE PLAN        Problem: Moderate/High Fall Risk Score >5  Goal: Patient will remain free of falls  Outcome: Progressing  Flowsheets (Taken 07/31/2022 2000)  High (Greater than 13):   HIGH-Visual cue at entrance to patient's room   HIGH-Bed alarm on at all times while patient in bed   HIGH-Utilize chair pad alarm for  patient while in the chair   HIGH-Apply yellow "Fall Risk" arm band   HIGH-Initiate use of floor mats as appropriate   HIGH-Consider use of low bed     Problem: Safety  Goal: Patient will be free from injury during hospitalization  Outcome: Progressing  Flowsheets (Taken 08/01/2022 0514)  Patient will be free from injury during hospitalization:   Use appropriate transfer methods   Assess patient's risk for falls and implement fall prevention plan of care per policy   Include patient/ family/ care giver in decisions related to safety   Provide alternative method of communication if needed (communication boards, writing)   Hourly  rounding   Assess for patients risk for elopement and implement Elopement Risk Plan per policy   Ensure appropriate safety devices are available at the bedside   Provide and maintain safe environment     Problem: Fluid and Electrolyte Imbalance/ Endocrine  Goal: Fluid and electrolyte balance are achieved/maintained  Outcome: Progressing  Flowsheets (Taken 08/01/2022 0514)  Fluid and electrolyte balance are achieved/maintained: Monitor/assess lab values and report abnormal values

## 2022-08-01 NOTE — PT Progress Note (Signed)
Physical Therapy Note    Physical Therapy Treatment  [name]  Post Acute Care Therapy Recommendations   Discharge Recommendations:  SNF    If recommended discharge disposition is not available, patient will require the following assistance: Total assist, Assist with OOB activity, and Assist with I/ADLs    DME needs IF patient is discharging home: Dressing stick, Long-handled sponge, Sock aid, Long-handled shoehorn, Hospital bed, Grab bars, BSC, Hoyer Lift    Therapy discharge recommendations may change with patient status.  Please refer to most recent note for up-to-date recommendations.    Unit: [facility]  Bed: A2502/A2502-B    ___________________________________________________    Time of treatment:  PT Received On: (P) 08/01/22  Start Time: (P) 1500  Stop Time: (P) 1540  Time Calculation (min): (P) 40 min       Chart Review and Collaboration with Care Team: 5 minutes, not included in above time.    PT Visit Number: (P) 4    ___________________________________________________    Precautions:   Precautions  Weight Bearing Status: (P) no restrictions  Restricted BP Precautions: (P) no R UE  Fall Risks: (P) High, Impaired balance/gait, Impaired mobility, Muscle weakness  Other Precautions: (P) Bleeding & Sores    Personal Protective Equipment (PPE)  gloves    Updated X-Rays/Tests/Labs:  Lab Results   Component Value Date/Time    HGB 8.8 (L) 08/01/2022 03:12 AM    HCT 28.2 (L) 08/01/2022 03:12 AM    K 4.4 07/29/2022 02:54 AM    NA 135 07/29/2022 02:54 AM    INR 1.6 (H) 07/24/2022 01:17 AM    TROPI 103.4 (AA) 07/24/2022 06:38 AM    TROPI 108.5 (AA) 07/24/2022 12:26 AM    TROPI calc n/a 07/24/2022 12:26 AM    TROPI 92.0 (AA) 07/23/2022 04:48 PM       All imaging reviewed, please see chart for details.      Subjective: "Thank you for your help."    Pain Assessment  Pain Assessment: (P) Numeric Scale (0-10)  Pain Score: (P)  (Unable to Assess)      Patient's medical condition is appropriate for  Physical Therapy intervention at this time.  Patient is agreeable to participation in the therapy session.      Objective:    PTA reviewed chart for POC and goals prior to treatment.    Observation of Patient/Vital Signs:  Blood pressure 97/53, pulse 71, temperature 98.2 F (36.8 C), temperature source Oral, resp. rate 16, height 1.575 m ('5\' 2"'$ ), weight 104.7 kg (230 lb 13.2 oz), SpO2 98 %.     Cognition/Neuro Status  Arousal/Alertness: (P) Appropriate responses to stimuli  Attention Span: (P) Appears intact  Orientation Level: (P) Oriented X4  Memory: (P) Appears intact  Following Commands: (P) Follows all commands and directions without difficulty  Safety Awareness: (P) independent  Insights: (P) Fully aware of deficits;Educated in safety awareness  Problem Solving: (P) Able to problem solve independently  Behavior: (P) calm;cooperative;attentive  Motor Planning: (P) decreased initiation;decreased processing speed  Coordination: (P) GMC impaired  Hand Dominance: (P) right handed    Functional Mobility  Rolling: (P) Maximal Assist;to Left;to Right;Logroll (maxA x2)  Scooting to HOB: (P) Maximal Assist;Increased Time;Increased Effort (maxA x2 w/ tapsheet)        Distance Walked (ft) (Step 6,7): (P) 0 Feet    Therapeutic Exercise  Heelslides: (P) x 10 BLE  Hip Abduction: (P) x 10 BLE      Educated the Patient  to role of physical therapy, plan of care, goals of therapy with verbalized understanding .    Patient left in bed with alarm and all other medical equipment in place and call bell and all personal items/needs within reach.  RN notified of session outcome.        Assessment:    Pt received supine exercises well, observed slight progression in LE ROM. Logrolling R<>L w/ maxA x2 w/ verbal instruction on hand placement. Required inc time during session to assist w/ pt hygiene. Patient would continue to benefit from skilled physical therapy to address deficits and increase functional independence.         PMP  Activity: (P) Step 3 - Bed Mobility  Distance Walked (ft) (Step 6,7): (P) 0 Feet    Plan:  Treatment/Interventions: (P) Exercise, Neuromuscular re-education, Functional transfer training, LE strengthening/ROM, Bed mobility, Compensatory technique education      PT Frequency: (P) 2-3x/wk   Continue plan of care.    Goals:  Goals  Goal Formulation: With patient  Time for Goal Acheivement: By time of discharge  Goals: Select goal  Pt Will Roll Right: (P) with minimal assist, to maximize functional mobility and independence, Partly met (required maxA x2 on 12/4)  Pt Will Go Supine To Sit: (P) with moderate assist, to maximize functional mobility and independence  Pt Will Achieve Sitting Balance: (P) 3/5 sits without UE support up to 30 seconds, to maximize functional mobility and independence  Pt Will Perform Home Exer Program: (P) independent, to maximize functional mobility and independence      Today's session was completed under the direct supervision of LPTA. LPTA was physically present and immediately available for the entire session and is fully responsible for the therapy tasks and activities being performed. LPTA is actively involved in the clinical assessment of the patient. Documentation and plan of care has been reviewed and approved by the LPTA.     [facility]  Patient: Meredith Baker MRN#: [id number]  Unit: [facility] Bed: A2502/A2502-B

## 2022-08-01 NOTE — Discharge Summary (Signed)
[facility]      Patient: Meredith Baker  Admission Date: 07/23/2022   DOB: 1946-08-29  Discharge Date: 08/01/2022    MRN: [id number]  Discharge Attending: A Delane Ginger, MD     Referring Physician: Pcp, None, MD  PCP: Pcp, None, MD       DISCHARGE SUMMARY     Discharge Information   Admission Diagnosis:   NSTEMI (non-ST elevated myocardial infarction)    Discharge Diagnosis:   Active Hospital Problems    Diagnosis    NSTEMI (non-ST elevated myocardial infarction)        Admission Condition: Guarded  Discharge Condition: Stable and improved  Consultants: Cardiology/pathology and gastroenterology  Functional Status: Bedbound  Discharged to: [facility] Course   Presentation History   76 years old female with history of hypertension, type 2 diabetes, hyperlipidemia, osteoarthritis, bedbound, history of bilateral DVT who presented for evaluation of generalized weakness presented for weakness and inability to care for herself admitted with a diagnosis of UTI and NSTEMI cardiology on board    See HPI for details.    Hospital Course (8 Days)     Anemia  Hemoglobin has been dropping from  From 12-10--8.2  I have ordered H&H monitor every 6 hours  Fecal occult blood test  Given patient has history of DVT I will continue the anticoagulation for now  Fecal occult blood test  Will monitor closely  No obvious bleeding noted  11/29: So far H&H stable between 8-9, continue heparin drip, pending hematology evaluation) final recommendation, fecal occult blood test pending, patient had 1 bowel movement yesterday which is brown, no obvious bleeding noted  11/30: H&H stayed stable, patient switched back to Pradaxa per heme-onc recommendation, fecal occult blood test came positive, GI consulted to weigh in, started on PPI  12/1-12/4: Discussed with GI, recommended outpatient EGD colonoscopy and Protonix 40 mg twice daily for a month followed by daily, H&H remained stable in fact uptrending plan communicated with the  patient as well as niece    Sepsis likely from UTI  Presented with leukocytosis and tachycardia  CT scan of the chest showed small left pleural effusion and evidence of pulmonary hypertension  UA consistent with UTI  Blood culture and urine culture came negative  Initially started on empirical broad-spectrum antibiotic vancomycin and Zosyn  MRSA test is negative  CT abdomen showed nonobstructive stone and small bladder stone  Antibiotic de-escalated to ceftriaxone  Leukocytosis improved  11/29-11/30: Patient will complete 5 days course of antibiotics today        History of DVT  Patient had bilateral lower extremity DVT back in September 2023 currently on Pradaxa  Repeat venous ultrasound showed left femoral popliteal DVT extending to the left peroneal vein and posterior tibialis vein with associated mild soft tissue swelling/edema and short segment nonocclusive thrombus within the right common femoral vein  I will involve hematology to guide on treatment  11/29: CTA ruled out PE, for now we will continue heparin drip, will switch her back to [place] once hematology clears  11/30: Patient is a switch to Pradaxa after hematology cleared the patient  12/1-12/4: Patient tolerated Pradaxa, outpatient follow-up with hematology    NSTEMI  Elevated troponin  Likely demand ischemia  Troponin trended up from 90-108  Patient is on aspirin, statin and Pradaxa  Cardiology on board, recommended to get CTA  Follow-up recommendation  11/29-12/4: 2D echo showed normal EF, no LV wall motion abnormality, elevated troponin is  likely from demand ischemia and anemia, continue aspirin/statin/Coreg/losartan.  Outpatient follow-up with cardiology        Chronic stable medical condition  Hypertension: Continue amlodipine/metoprolol/losartan  11/29-12/4: Blood pressure within acceptable range, outpatient follow-up with PCP     Type 2 diabetes  Carb controlled diet  Sliding scale  Hypoglycemia protocol  12/1-12/4: Increase Lantus to 10, continue  sliding scale, and carb controlled diet.  :  Morbid obesity  Outpatient follow-up     Bedbound  Wound on her left iliac area  Wound nurse consult       Procedures/Imaging:   Upon my review: Negative except discussed above         Progress Note/Physical Exam at Discharge     Subjective:Patient is stable for discharge.    Vitals:    08/01/22 0708 08/01/22 0845 08/01/22 1057 08/01/22 1508   BP: 93/60 120/70 100/63 97/53   Pulse: 95 92 91 71   Resp: '14  16 16   '$ Temp: 97.9 F (36.6 C)  98.1 F (36.7 C) 98.2 F (36.8 C)   TempSrc: Oral  Oral Oral   SpO2: 100% 97% 99% 98%   Weight:       Height:               General: Chronically sick looking, NAD, AAOx3  HEENT: Sclera anicteric, no conjunctival injection, OP: Clear, MMM  Neck: Supple, FROM, no LAD  Cardiovascular: RRR, no m/r/g  Lungs: CTAB, no w/r/r  Abdomen: Soft, +BS, NT/ND, no masses, no g/r  Extremities: No C/C/E  Skin: No rashes or lesions noted  Neuro: Weak all over but able to follow commands and move all extremities       Diagnostics     Labs/Studies Pending at Discharge: Yes outpatient EGD and colonoscopy    Last Labs   Recent Labs   Lab 08/01/22  0312 07/30/22  0324 07/29/22  0254   WBC 9.96* 9.76* 8.64   RBC 2.78* 3.04* 2.73*   Hgb 8.8* 9.3* 8.5*   Hematocrit 28.2* 30.3* 26.8*   MCV 101.4* 99.7* 98.2*   Platelets 550* 495* 481*         Recent Labs   Lab 07/29/22  0254 07/28/22  0908 07/27/22  0455 07/26/22  0256   Sodium 135 136 138 142   Potassium 4.4 4.5 4.1 3.4*   Chloride 104 107 109 111   CO2 '26 23 21 24   '$ BUN 13.0 11.0 10.0 10.0   Creatinine 0.7 0.7 0.8 0.7   Glucose 284* 189* 243* 174*   Calcium 8.7 8.5 8.3 7.9   Magnesium  --  1.6 1.8 1.0*         Microbiology Results (last 15 days)       Procedure Component Value Units Date/Time    MRSA culture [id number] Collected: 07/24/22 1932    Order Status: Completed Specimen: Culturette from Nasal Swab Updated: 07/26/22 0214     Culture MRSA Surveillance Negative for Methicillin Resistant Staph aureus     MRSA culture [id number] Collected: 07/24/22 1932    Order Status: Completed Specimen: Culturette from Throat Updated: 07/26/22 0214     Culture MRSA Surveillance Negative for Methicillin Resistant Staph aureus    Urine culture [id number] Collected: 07/24/22 1932    Order Status: Completed Specimen: Bladder Updated: 07/26/22 0413    Narrative:      ORDER#: [id number]  ORDERED BY: BRENNAN, IRINA  SOURCE: Urine                                        COLLECTED:  07/24/22 19:32  ANTIBIOTICS AT COLL.:                                RECEIVED :  07/24/22 20:10  Culture Urine                              FINAL       07/26/22 04:13  07/26/22   No growth of >1,000 CFU/ML, No further work      Culture Blood Aerobic and Anaerobic [id number] Collected: 07/23/22 2218    Order Status: Completed Specimen: Blood, Venipuncture Updated: 07/29/22 0421    Narrative:      The order will result in two separate 8-65m bottles  Please do NOT order repeat blood cultures if one has been  drawn within the last 48 hours  UNLESS concerned for  endocarditis  AVOID BLOOD CULTURE DRAWS FROM CENTRAL LINE IF POSSIBLE  Indications:->Sepsis  ORDER#: [id number]                                   ORDERED BY: BEnis Gash SOURCE: Blood, Venipuncture                          COLLECTED:  07/23/22 22:18  ANTIBIOTICS AT COLL.:                                RECEIVED :  07/24/22 01:42  Culture Blood Aerobic and Anaerobic        FINAL       07/29/22 04:21  07/29/22   No growth after 5 days of incubation.      Culture Blood Aerobic and Anaerobic [[id number]: 07/23/22 2218    Order Status: Completed Specimen: Blood, Venipuncture Updated: 07/29/22 0421    Narrative:      The order will result in two separate 8-130mbottles  Please do NOT order repeat blood cultures if one has been  drawn within the last 48 hours  UNLESS concerned for  endocarditis  AVOID BLOOD CULTURE DRAWS FROM CENTRAL LINE IF  POSSIBLE  Indications:->Sepsis  ORDER#: [id number]                                  ORDERED BY: BREnis GashSOURCE: Blood, Venipuncture                          COLLECTED:  07/23/22 22:18  ANTIBIOTICS AT COLL.:                                RECEIVED :  07/24/22 01:42  Culture Blood Aerobic and Anaerobic        FINAL       07/29/22 04:21  07/29/22   No growth after 5 days  of incubation.      COVID-19 (SARS-CoV-2) and Influenza A/B, NAA (Liat Rapid) [id number] Collected: 07/23/22 2218    Order Status: Completed Specimen: Culturette from Nasopharyngeal Updated: 07/23/22 2259     Purpose of COVID testing Diagnostic -PUI     SARS-CoV-2 Specimen Source Nasal Swab     SARS CoV 2 Overall Result Not Detected     Comment: __________________________________________________  -A result of "Detected" indicates POSITIVE for the    presence of SARS CoV-2 RNA  -A result of "Not Detected" indicates NEGATIVE for the    presence of SARS CoV-2 RNA  __________________________________________________________  Test performed using the Roche cobas Liat SARS-CoV-2 assay. This assay is  only for use under the Food and Drug Administration's Emergency Use  Authorization. This is a real-time RT-PCR assay for the qualitative  detection of SARS-CoV-2 RNA. Viral nucleic acids may persist in vivo,  independent of viability. Detection of viral nucleic acid does not imply the  presence of infectious virus, or that virus nucleic acid is the cause of  clinical symptoms. Negative results do not preclude SARS-CoV-2 infection and  should not be used as the sole basis for diagnosis, treatment or other  patient management decisions. Negative results must be combined with  clinical observations, patient history, and/or epidemiological information.  Invalid results may be due to inhibiting substances in the specimen and  recollection should occur. Please see Fact Sheets for patients and  providers  located:  https://www.benson-chung.com/          Influenza A Not Detected     Influenza B Not Detected     Comment: Test performed using the Roche cobas Liat SARS-CoV-2 & Influenza A/B assay.  This assay is only for use under the Food and Drug Administration's  Emergency Use Authorization. This is a multiplex real-time RT-PCR assay  intended for the simultaneous in vitro qualitative detection and  differentiation of SARS-CoV-2, influenza A, and influenza B virus RNA. Viral  nucleic acids may persist in vivo, independent of viability. Detection of  viral nucleic acid does not imply the presence of infectious virus, or that  virus nucleic acid is the cause of clinical symptoms. Negative results do  not preclude SARS-CoV-2, influenza A, and/or influenza B infection and  should not be used as the sole basis for diagnosis, treatment or other  patient management decisions. Negative results must be combined with  clinical observations, patient history, and/or epidemiological information.  Invalid results may be due to inhibiting substances in the specimen and  recollection should occur. Please see Fact Sheets for patients and providers  located: http://olson-hall.info/.         Narrative:      o Collect and clearly label specimen type:  o PREFERRED-Upper respiratory specimen: One Nasal Swab in  Transport Media.  o Hand deliver to laboratory ASAP  Diagnostic -PUI             Patient Instructions   Discharge Diet: Heart healthy diet  Discharge Activity: As tolerated    Follow Up Appointment:   Follow-up Information       West Carbo, MD Follow up in 1 week(s).    Specialties: Gastroenterology, Internal Medicine  Contact information:  [place] [address]  [phone]               Tia Alert, MD Follow up in 1 week(s).    Specialties: Medical Oncology, Hematology  Contact information:  [place]  [phone]  [place] [id number]  [id number]                Vonzell Schlatter, MD Follow up in 1 week(s).    Specialties: Interventional Cardiology, Internal Medicine, Cardiology  Contact information:  [place]  [place] [address]  [phone]               Pcp, None, MD .                              Discharge Medications:     Medication List        START taking these medications      aspirin 81 MG chewable tablet  Chew 1 tablet (81 mg) by mouth daily     atorvastatin 40 MG tablet  Commonly known as: LIPITOR  Take 1 tablet (40 mg) by mouth nightly     carboxymethylcellulose 0.5 % ophthalmic solution  Commonly known as: REFRESH TEARS  Place 1 drop into both eyes 3 (three) times daily as needed (dry eye)     insulin glargine 100 UNIT/ML injection  Commonly known as: LANTUS  Inject 10 Units into the skin nightly     pantoprazole 40 MG tablet  Commonly known as: PROTONIX  Take 1 tablet (40 mg) by mouth 2 (two) times daily for 30 days, THEN 1 tablet (40 mg) daily.  Start taking on: July 30, 2022            CONTINUE taking these medications      acetaminophen 500 MG tablet  Commonly known as: TYLENOL     CENTRUM SILVER 50+WOMEN PO     dabigatran 150 MG Caps  Commonly known as: PRADAXA     losartan 100 MG TABS, hydroCHLOROthiazide 25 MG TABS     metoprolol succinate 200 MG 24 hr tablet  Commonly known as: TOPROL-XL     TURMERIC PO     valACYclovir 1000 MG tablet  Commonly known as: VALTREX     vitamin D (ergocalciferol) 50000 UNIT Caps  Commonly known as: DRISDOL            STOP taking these medications      amLODIPine 5 MG tablet  Commonly known as: NORVASC     HUMULIN 70/30 KWIKPEN SC               Where to Get Your Medications        These medications were sent to [facility] [place], [place] - [address]  [address] [place] [address]      Phone: [phone]   aspirin 81 MG chewable tablet  atorvastatin 40 MG tablet  pantoprazole 40 MG tablet       Information about where to get these medications is not yet  available    Ask your nurse or doctor about these medications  carboxymethylcellulose 0.5 % ophthalmic solution  insulin glargine 100 UNIT/ML injection          Time spent examining patient, discussing with patient/family regarding hospital course, chart review, reconciling medications and discharge planning: 40 minutes.    Signed,  Arley Phenix, MD    4:10 PM 08/01/2022

## 2022-08-01 NOTE — UM Notes (Signed)
Continued Stay Review:07/30/2022 & 07/31/2022  Admission status:inpt        07/30/22 2302 -- 98.4 F (36.9 C) Oral 78 76 % (Abnormal)   -- 18 130/75     CBC without differential [id number] (Abnormal) Collected: 07/30/22 0324   Specimen: Blood Updated: 07/30/22 0336    WBC 9.76 High  x10 3/uL     Hgb 9.3 Low  g/dL     Hematocrit 30.3 Low  %     Platelets 495 High  x10 3/uL     RBC 3.04 Low  x10 6/uL     MCV 99.7 High  fL     MCH 30.6 pg     MCHC 30.7 Low  g/dL     RDW 19 High  %     MPV 8.7 Low  fL             Glucose Whole Blood - POCT [id number] (Abnormal) Collected: 07/30/22 2129    Updated: 07/30/22 2132    Whole Blood Glucose POCT 227 High  mg/dL    Glucose Whole Blood - POCT [id number] (Abnormal) Collected: 07/30/22 1533    Updated: 07/30/22 1633    Whole Blood Glucose POCT 298 High  mg/dL    Glucose Whole Blood - POCT [id number] (Abnormal) Collected: 07/30/22 1138    Updated: 07/30/22 1255    Whole Blood Glucose POCT 164 High  mg/dL    Glucose Whole Blood - POCT [id number] (Abnormal) Collected: 07/30/22 1241    Updated: 07/30/22 1245    Whole Blood Glucose POCT 169 High  mg/dL    Glucose Whole Blood - POCT [id number] (Abnormal) Collected: 07/30/22 0847    Updated: 07/30/22 0851    Whole Blood Glucose POCT 192 High  mg/dL      10/01/21 15:36:39 98.1 F (36.7 C) -- 177 Abnormal  16 128/79 98 %     Glucose Whole Blood - POCT [id number] (Abnormal) Collected: 07/31/22 2050    Updated: 07/31/22 2053    Whole Blood Glucose POCT 228 High  mg/dL    Glucose Whole Blood - POCT [id number] (Abnormal) Collected: 07/31/22 1534    Updated: 07/31/22 1608    Whole Blood Glucose POCT 184 High  mg/dL    Glucose Whole Blood - POCT [id number] (Abnormal) Collected: 07/31/22 1128    Updated: 07/31/22 1148    Whole Blood Glucose POCT 181 High  mg/dL    Glucose Whole Blood - POCT [id number] (Abnormal) Collected: 07/31/22 0728    Updated: 07/31/22 0800       Marciano Sequin RN, BSN, ACM-RN, CCM  Utilization Review RN Case Manager  [facility]  Utilization Review Department  [address] [place]  [place], [place] [address]  T [phone] (Confidential voice mail) C [phone] (Confidential voice mail)   Jerilynn Mages [phone] F [phone]  [email or web address]

## 2022-08-01 NOTE — Progress Notes (Signed)
Nutritional Support Services  Nutrition Follow-up    Meredith Baker 76 y.o. female   MRN: [id number]    Summary of Nutrition Recommendations:    1. Continue CCHO diet    2. Continue Ensure Plus HP 1 bottle PO TID to optimize nutritional intake.   Each Ensure Plus High Protein provides 350 kcal and 20 gm protein.    3. Daily weights.     4. Consider adjusting sliding scale d/t high BG. Target BG range: 140-'180mg'$ /dl    -----------------------------------------------------------------------------------------------------------------                                                           ASSESSMENT DATA     Subjective Nutrition: Patient seen at bedside, awake. Patient reporting appetite has been improving, feels she is consuming ~50% of meals (c/w charted intake). Pt reports drinking 1 ONS per day, likely providing additional 20-25% of daily needs. Observed opened ONS 100% consumed at bedside, but also several ONS unopened at bedside. Discussed adjusting ONS frequency, pt requesting to continue current frequency. Will continue to monitor as able.    Learning Needs: no educational needs    Events of Current Admission:  11 female with PMHx DM, presenting with generalized weakness.     Medical Hx:  has a past medical history of Diabetes mellitus, History of blood clots, and Hypertension.       Orders Placed This Encounter   Procedures    Diet consistent carbohydrate    Ensure Plus High Protein Supplement Quantity: A. One; Flavor: Vanilla; Frequency: TID (3 times a day) with meals       Intake: average 50% meal intake   07/28/22 0908 07/28/22 1800 07/28/22 1900   Intake (mL)   Percent Meal Consumed (%) 25% 0% 100%      07/29/22 0755 07/30/22 1035 07/30/22 2200   Intake (mL)   Percent Meal Consumed (%) 0% 100%  --    Percent Supplement Consumed (%)  --  100% 0%      07/31/22 0933 08/01/22 0900   Intake (mL)   Percent Meal Consumed (%) 75% 50%   Percent Supplement Consumed (%)  --  100%           ANTHROPOMETRIC  Height:  157.5 cm ('5\' 2"'$ )  Weight: 104.7 kg (230 lb 13.2 oz)  Weight Change: 2.65  Body mass index is 42.22 kg/m.      Weight Monitoring     Weight Weight Method   07/23/2022 100.336 kg  Bed Scale    07/24/2022 102.9 kg  Bed Scale     102.9 kg  Bed Scale     103.103 kg     07/27/2022 105.7 kg  Bed Scale    07/28/2022 109.589 kg  Bed Scale    07/29/2022 110 kg  Bed Scale    07/30/2022 106 kg  Bed Scale    07/31/2022 102 kg  Bed Scale    08/01/2022 104.7 kg  Bed Scale        Weight History Summary: Slightly variable weights, potential weight gain during admission        ESTIMATED NEEDS    Total Daily Energy Needs: 1546.5 to 2062 kcal  Method for Calculating Energy Needs: 15 kcal - 20 kcal per kg  at 103.1 kg (Actual body  weight)  Rationale: obese, noncritical       Total Daily Protein Needs: 78.3 to 104.4 g  Method for Calculating Protein Needs: 1.5 g - 2 g per kg at 52.2 kg (Ideal body weight)  Rationale: obese, noncritical      Total Daily Fluid Needs: 1148.4 to 1409.4 ml  Method for Calculating Fluid Needs: 22 ml - 27 ml  per kg at 52.2 kg (Ideal body weight)  Rationale: bmi, age, or per md    Pertinent Medications: lantus, protonix, SSI, miralax, pericoalce    Pertinent labs: no new labs; recent BG levels 164-'298mg'$ /dl within the past 72 hrs  Recent Labs   Lab 07/29/22  0254 07/28/22  0908 07/27/22  0455 07/26/22  0256   Sodium 135 136 138 142   Potassium 4.4 4.5 4.1 3.4*   Chloride 104 107 109 111   CO2 '26 23 21 24   '$ BUN 13.0 11.0 10.0 10.0   Creatinine 0.7 0.7 0.8 0.7   Glucose 284* 189* 243* 174*   Calcium 8.7 8.5 8.3 7.9   Magnesium  --  1.6 1.8 1.0*   Phosphorus  --  2.4 2.4  --    eGFR >60.0 >60.0 >60.0 >60.0   Triglycerides  --   --   --  122   Hemoglobin A1C  --   --   --  6.0*       Physical Assessment: August 01, 2022   Head: temple region: slight depression with decrease in muscle tone/resistance (mild muscle loss - temporalis), orbital region: slightly bulged fat pads, ample bounce back, fluid retention may mask  loss, dehydration may falsely appear as loss (no wasting observed), and buccal region: slight depression, somewhat sunken appearance, flat cheeks, decrease in bounce back of fat pads (mild fat loss)  Upper Body: clavicle bone region: some protrusion of the clavicle with decrease in muscle tone/resistance (mild muscle loss - pectoralis major), shoulder and acromion bone region: slight protrusion of acromion process, decrease in muscle tone/resistance (mild muscle loss - deltoid), upper arm region: ample fat tissue between fingers (no wasting observed), and dorsal hand region: muscle may bulge or be flat, no depression, feel muscle tone/resistance (no wasting observed)  Lower Body: no s/s subcutaneous fat or muscle loss and edema: mild pitting (1+), indentation lasts 0 to 15 seconds (mild edema - BLE)  Skin: left flank wound per flow sheet  GI function: Last BM Date: 07/31/22                                                    NUTRITION DIAGNOSIS     Moderate Malnutrition related to inadequate nutritional  intake in the setting of social/environmental circumstances as evidenced by <50% EER x 1 month, mild muscle losses (temporalis, pectoralis deltoid) - active                                                            INTERVENTION     Nutrition recommendation - Please refer to top of note  MONITORING/EVALUATION     Goals:    1. Patient will consume >/=75% of nutritional needs via meals by next RD follow up - active     Nutrition Risk Level: High (will follow up at least 2 times per week and PRN)    *Can likely downgrade at next RD follow up if continuing to improve PO intake      Parke Simmers, RD, [place] Dietitian  [phone]

## 2022-08-01 NOTE — Progress Notes (Addendum)
See previous CM note. CM Called [place] ( [facility] ) # [phone] who confirmed that niece has submitted LTC paperwork. Niece to f/u with patient to confirm missing financial information.

## 2022-08-02 LAB — GLUCOSE WHOLE BLOOD - POCT
Whole Blood Glucose POCT: 191 mg/dL — ABNORMAL HIGH (ref 70–100)
Whole Blood Glucose POCT: 240 mg/dL — ABNORMAL HIGH (ref 70–100)
Whole Blood Glucose POCT: 292 mg/dL — ABNORMAL HIGH (ref 70–100)
Whole Blood Glucose POCT: 288 mg/dL — ABNORMAL HIGH (ref 70–100)

## 2022-08-02 MED ORDER — INSULIN GLARGINE 100 UNIT/ML SC SOLN
12.0000 [IU] | Freq: Every evening | SUBCUTANEOUS | Status: DC
Start: 2022-08-02 — End: 2022-08-15
  Administered 2022-08-02 – 2022-08-14 (×13): 12 [IU] via SUBCUTANEOUS
  Filled 2022-08-02 (×13): qty 12

## 2022-08-02 MED ORDER — SODIUM CHLORIDE 0.9 % IV BOLUS
500.0000 mL | Freq: Once | INTRAVENOUS | Status: AC
Start: 2022-08-02 — End: 2022-08-02
  Administered 2022-08-02: 500 mL via INTRAVENOUS

## 2022-08-02 MED ORDER — INSULIN GLARGINE 100 UNIT/ML SC SOLN
12.0000 [IU] | Freq: Every evening | SUBCUTANEOUS | Status: AC
Start: 2022-08-02 — End: ?

## 2022-08-02 NOTE — Discharge Summary (Signed)
[facility]      Patient: Meredith Baker  Admission Date: 07/23/2022   DOB: 1945/09/22  Discharge Date: 08/02/2022    MRN: [id number]  Discharge Attending: A Delane Ginger, MD     Referring Physician: Pcp, None, MD  PCP: Pcp, None, MD       DISCHARGE SUMMARY     Discharge Information   Admission Diagnosis:   NSTEMI (non-ST elevated myocardial infarction)    Discharge Diagnosis:   Active Hospital Problems    Diagnosis    NSTEMI (non-ST elevated myocardial infarction)        Admission Condition: Guarded  Discharge Condition: Stable and improved  Consultants: Cardiology/pathology and gastroenterology  Functional Status: Bedbound  Discharged to: [facility] Course   Presentation History   76 years old female with history of hypertension, type 2 diabetes, hyperlipidemia, osteoarthritis, bedbound, history of bilateral DVT who presented for evaluation of generalized weakness presented for weakness and inability to care for herself admitted with a diagnosis of UTI and NSTEMI cardiology on board, Noted trended down H/H.    See HPI for details.    Hospital Course (9 Days)     Anemia  Hemoglobin has been dropping from  From 12-10--8.2  I have ordered H&H monitor every 6 hours  Fecal occult blood test  Given patient has history of DVT I will continue the anticoagulation for now  Fecal occult blood test  Will monitor closely  No obvious bleeding noted  11/29: So far H&H stable between 8-9, continue heparin drip, pending hematology evaluation) final recommendation, fecal occult blood test pending, patient had 1 bowel movement yesterday which is brown, no obvious bleeding noted  11/30: H&H stayed stable, patient switched back to Pradaxa per heme-onc recommendation, fecal occult blood test came positive, GI consulted to weigh in, started on PPI  12/1-12/5: Discussed with GI, recommended outpatient EGD colonoscopy and Protonix 40 mg twice daily for a month followed by daily, H&H remained stable in fact uptrending plan  communicated with the patient as well as niece    Sepsis likely from UTI  Presented with leukocytosis and tachycardia  CT scan of the chest showed small left pleural effusion and evidence of pulmonary hypertension  UA consistent with UTI  Blood culture and urine culture came negative  Initially started on empirical broad-spectrum antibiotic vancomycin and Zosyn  MRSA test is negative  CT abdomen showed nonobstructive stone and small bladder stone  Antibiotic de-escalated to ceftriaxone  Leukocytosis improved  11/29-11/30: Patient will complete 5 days course of antibiotics today        History of DVT  Patient had bilateral lower extremity DVT back in September 2023 currently on Pradaxa  Repeat venous ultrasound showed left femoral popliteal DVT extending to the left peroneal vein and posterior tibialis vein with associated mild soft tissue swelling/edema and short segment nonocclusive thrombus within the right common femoral vein  I will involve hematology to guide on treatment  11/29: CTA ruled out PE, for now we will continue heparin drip, will switch her back to [place] once hematology clears  11/30: Patient is a switch to Pradaxa after hematology cleared the patient  12/1-12/5: Patient tolerated Pradaxa, outpatient follow-up with hematology    NSTEMI  Elevated troponin  Likely demand ischemia  Troponin trended up from 90-108  Patient is on aspirin, statin and Pradaxa  Cardiology on board, recommended to get CTA  Follow-up recommendation  11/29-12/5: 2D echo showed normal EF, no LV wall motion  abnormality, elevated troponin is likely from demand ischemia and anemia, continue aspirin/statin/Coreg/losartan.  Outpatient follow-up with cardiology        Chronic stable medical condition  Hypertension: Continue amlodipine/metoprolol/losartan  11/29-12/5: Blood pressure within acceptable range, outpatient follow-up with PCP     Type 2 diabetes  Carb controlled diet  Sliding scale  Hypoglycemia protocol  12/1-12/5: Increase  Lantus to 10, continue sliding scale, and carb controlled diet.  12/5: Blood sugar low 200s, I have increased the Lantus to 12, continue to monitor POC    Morbid obesity  Outpatient follow-up     Bedbound  Wound on her left iliac area  Wound nurse consult       Procedures/Imaging:   Upon my review: Negative except discussed above         Progress Note/Physical Exam at Discharge     Subjective:Patient is stable for discharge.    Vitals:    08/02/22 0846 08/02/22 0846 08/02/22 1124 08/02/22 1526   BP: 145/85 145/86 114/68 138/69   Pulse: 92 92 100 99   Resp:   16 14   Temp:   97.9 F (36.6 C) 97.3 F (36.3 C)   TempSrc:   Oral Oral   SpO2:  95% 100% 98%   Weight:       Height:               General: Chronically sick looking, NAD, AAOx3  HEENT: Sclera anicteric, no conjunctival injection, OP: Clear, MMM  Neck: Supple, FROM, no LAD  Cardiovascular: RRR, no m/r/g  Lungs: CTAB, no w/r/r  Abdomen: Soft, +BS, NT/ND, no masses, no g/r  Extremities: No C/C/E  Skin: No rashes or lesions noted  Neuro: Weak all over but able to follow commands and move all extremities       Diagnostics     Labs/Studies Pending at Discharge: Yes outpatient EGD and colonoscopy    Last Labs   Recent Labs   Lab 08/01/22  0312 07/30/22  0324 07/29/22  0254   WBC 9.96* 9.76* 8.64   RBC 2.78* 3.04* 2.73*   Hgb 8.8* 9.3* 8.5*   Hematocrit 28.2* 30.3* 26.8*   MCV 101.4* 99.7* 98.2*   Platelets 550* 495* 481*         Recent Labs   Lab 07/29/22  0254 07/28/22  0908 07/27/22  0455   Sodium 135 136 138   Potassium 4.4 4.5 4.1   Chloride 104 107 109   CO2 '26 23 21   '$ BUN 13.0 11.0 10.0   Creatinine 0.7 0.7 0.8   Glucose 284* 189* 243*   Calcium 8.7 8.5 8.3   Magnesium  --  1.6 1.8         Microbiology Results (last 15 days)       Procedure Component Value Units Date/Time    MRSA culture [id number] Collected: 07/24/22 1932    Order Status: Completed Specimen: Culturette from Nasal Swab Updated: 07/26/22 0214     Culture MRSA Surveillance Negative for  Methicillin Resistant Staph aureus    MRSA culture [id number] Collected: 07/24/22 1932    Order Status: Completed Specimen: Culturette from Throat Updated: 07/26/22 0214     Culture MRSA Surveillance Negative for Methicillin Resistant Staph aureus    Urine culture [id number] Collected: 07/24/22 1932    Order Status: Completed Specimen: Bladder Updated: 07/26/22 0413    Narrative:      ORDER#: [id number]  ORDERED BY: BRENNAN, IRINA  SOURCE: Urine                                        COLLECTED:  07/24/22 19:32  ANTIBIOTICS AT COLL.:                                RECEIVED :  07/24/22 20:10  Culture Urine                              FINAL       07/26/22 04:13  07/26/22   No growth of >1,000 CFU/ML, No further work      Culture Blood Aerobic and Anaerobic [id number] Collected: 07/23/22 2218    Order Status: Completed Specimen: Blood, Venipuncture Updated: 07/29/22 0421    Narrative:      The order will result in two separate 8-32m bottles  Please do NOT order repeat blood cultures if one has been  drawn within the last 48 hours  UNLESS concerned for  endocarditis  AVOID BLOOD CULTURE DRAWS FROM CENTRAL LINE IF POSSIBLE  Indications:->Sepsis  ORDER#: [id number]                                   ORDERED BY: BEnis Gash SOURCE: Blood, Venipuncture                          COLLECTED:  07/23/22 22:18  ANTIBIOTICS AT COLL.:                                RECEIVED :  07/24/22 01:42  Culture Blood Aerobic and Anaerobic        FINAL       07/29/22 04:21  07/29/22   No growth after 5 days of incubation.      Culture Blood Aerobic and Anaerobic [[id number]: 07/23/22 2218    Order Status: Completed Specimen: Blood, Venipuncture Updated: 07/29/22 0421    Narrative:      The order will result in two separate 8-133mbottles  Please do NOT order repeat blood cultures if one has been  drawn within the last 48 hours  UNLESS concerned for  endocarditis  AVOID BLOOD CULTURE DRAWS FROM  CENTRAL LINE IF POSSIBLE  Indications:->Sepsis  ORDER#: [id number]                                  ORDERED BY: BREnis GashSOURCE: Blood, Venipuncture                          COLLECTED:  07/23/22 22:18  ANTIBIOTICS AT COLL.:                                RECEIVED :  07/24/22 01:42  Culture Blood Aerobic and Anaerobic        FINAL       07/29/22 04:21  07/29/22   No growth after 5 days  of incubation.      COVID-19 (SARS-CoV-2) and Influenza A/B, NAA (Liat Rapid) [id number] Collected: 07/23/22 2218    Order Status: Completed Specimen: Culturette from Nasopharyngeal Updated: 07/23/22 2259     Purpose of COVID testing Diagnostic -[facility]     SARS-CoV-2 Specimen Source Nasal Swab     SARS CoV 2 Overall Result Not Detected     Comment: __________________________________________________  -A result of "Detected" indicates POSITIVE for the    presence of SARS CoV-2 RNA  -A result of "Not Detected" indicates NEGATIVE for the    presence of SARS CoV-2 RNA  __________________________________________________________  Test performed using the Roche cobas Liat SARS-CoV-2 assay. This assay is  only for use under the Food and Drug Administration's Emergency Use  Authorization. This is a real-time RT-PCR assay for the qualitative  detection of SARS-CoV-2 RNA. Viral nucleic acids may persist in vivo,  independent of viability. Detection of viral nucleic acid does not imply the  presence of infectious virus, or that virus nucleic acid is the cause of  clinical symptoms. Negative results do not preclude SARS-CoV-2 infection and  should not be used as the sole basis for diagnosis, treatment or other  patient management decisions. Negative results must be combined with  clinical observations, patient history, and/or epidemiological information.  Invalid results may be due to inhibiting substances in the specimen and  recollection should occur. Please see Fact Sheets for patients and  providers  located:  https://www.benson-chung.com/          Influenza A Not Detected     Influenza B Not Detected     Comment: Test performed using the Roche cobas Liat SARS-CoV-2 & Influenza A/B assay.  This assay is only for use under the Food and Drug Administration's  Emergency Use Authorization. This is a multiplex real-time RT-PCR assay  intended for the simultaneous in vitro qualitative detection and  differentiation of SARS-CoV-2, influenza A, and influenza B virus RNA. Viral  nucleic acids may persist in vivo, independent of viability. Detection of  viral nucleic acid does not imply the presence of infectious virus, or that  virus nucleic acid is the cause of clinical symptoms. Negative results do  not preclude SARS-CoV-2, influenza A, and/or influenza B infection and  should not be used as the sole basis for diagnosis, treatment or other  patient management decisions. Negative results must be combined with  clinical observations, patient history, and/or epidemiological information.  Invalid results may be due to inhibiting substances in the specimen and  recollection should occur. Please see Fact Sheets for patients and providers  located: http://olson-hall.info/.         Narrative:      o Collect and clearly label specimen type:  o PREFERRED-Upper respiratory specimen: One Nasal Swab in  Transport Media.  o Hand deliver to laboratory ASAP  Diagnostic -[facility]             Patient Instructions   Discharge Diet: Heart healthy diet  Discharge Activity: As tolerated    Follow Up Appointment:   Follow-up Information       West Carbo, MD Follow up in 1 week(s).    Specialties: Gastroenterology, Internal Medicine  Contact information:  [place] [address]  [phone]               Tia Alert, MD Follow up in 1 week(s).    Specialties: Medical Oncology, Hematology  Contact information:  [place]  [phone]  [place] [address]  [id number]                Vonzell Schlatter, MD Follow up in 1 week(s).    Specialties: Interventional Cardiology, Internal Medicine, Cardiology  Contact information:  [address]  [place] [address]  [phone]               Pcp, None, MD .                              Discharge Medications:     Medication List        START taking these medications      aspirin 81 MG chewable tablet  Chew 1 tablet (81 mg) by mouth daily     atorvastatin 40 MG tablet  Commonly known as: LIPITOR  Take 1 tablet (40 mg) by mouth nightly     carboxymethylcellulose 0.5 % ophthalmic solution  Commonly known as: REFRESH TEARS  Place 1 drop into both eyes 3 (three) times daily as needed (dry eye)     insulin glargine 100 UNIT/ML injection  Commonly known as: LANTUS  Inject 10 Units into the skin nightly     pantoprazole 40 MG tablet  Commonly known as: PROTONIX  Take 1 tablet (40 mg) by mouth 2 (two) times daily for 30 days, THEN 1 tablet (40 mg) daily.  Start taking on: July 30, 2022            CONTINUE taking these medications      acetaminophen 500 MG tablet  Commonly known as: TYLENOL     CENTRUM SILVER 50+WOMEN PO     dabigatran 150 MG Caps  Commonly known as: PRADAXA     losartan 100 MG TABS, hydroCHLOROthiazide 25 MG TABS     metoprolol succinate 200 MG 24 hr tablet  Commonly known as: TOPROL-XL     TURMERIC PO     valACYclovir 1000 MG tablet  Commonly known as: VALTREX     vitamin D (ergocalciferol) 50000 UNIT Caps  Commonly known as: DRISDOL            STOP taking these medications      amLODIPine 5 MG tablet  Commonly known as: NORVASC     HUMULIN 70/30 KWIKPEN SC               Where to Get Your Medications        These medications were sent to [facility] [place], [place] - [address]  [address], [place] [address]      Phone: [phone]   aspirin 81 MG chewable tablet  atorvastatin 40 MG tablet  pantoprazole 40 MG tablet       Information about where to get these medications is not yet  available    Ask your nurse or doctor about these medications  carboxymethylcellulose 0.5 % ophthalmic solution  insulin glargine 100 UNIT/ML injection          Time spent examining patient, discussing with patient/family regarding hospital course, chart review, reconciling medications and discharge planning: 40 minutes.    Signed,  Arley Phenix, MD    4:49 PM 08/02/2022

## 2022-08-02 NOTE — Plan of Care (Signed)
NURSING SHIFT NOTE     Patient: Meredith Baker  Day: 9      SHIFT EVENTS     Shift Narrative/Significant Events (PRN med administration, fall, RRT, etc.):     Patient is Alert and Oriented x2/3. Patient denies of any pain, dizziness and SOB. Patient shows normal sinus rhythm on tele. Fall precautions in place. Patient's BP dipped to 90/48. Notified [place]. 555m bolus ordered and given. Pt tolerated well. No acute distress noted. Safety measures continue. Bed is locked and in lowest setting. Continue to monitor.     Safety and fall precautions remain in place. Purposeful rounding completed.          ASSESSMENT     Changes in assessment from patient's baseline this shift:    Neuro: No  CV: No  Pulm: No  Peripheral Vascular: No  HEENT: No  GI: No  BM during shift: No, Last BM: Last BM Date: 07/31/22  GU: No   Integ: No  MS: No    Pain: None  Pain Interventions:   Medications Utilized:     Mobility: PMP Activity: Step 3 - Bed Mobility of Distance Walked (ft) (Step 6,7): 0 Feet           Lines     Patient Lines/Drains/Airways Status       Active Lines, Drains and Airways       Name Placement date Placement time Site Days    Peripheral IV 07/24/22 20 G Anterior;Left Forearm 07/24/22  0030  Forearm  9    External Urinary Catheter 07/23/22  1602  --  9                         VITAL SIGNS     Vitals:    08/02/22 0412   BP: 90/48   Pulse: 91   Resp: 16   Temp:    SpO2: 94%       Temp  Min: 97.9 F (36.6 C)  Max: 98.2 F (36.8 C)  Pulse  Min: 71  Max: 112  Resp  Min: 14  Max: 16  BP  Min: 90/48  Max: 124/69  SpO2  Min: 94 %  Max: 100 %      Intake/Output Summary (Last 24 hours) at 08/02/2022 0535  Last data filed at 08/01/2022 0900  Gross per 24 hour   Intake 120 ml   Output --   Net 120 ml            CARE PLAN        Problem: Pain interferes with ability to perform ADL  Goal: Pain at adequate level as identified by patient  Outcome: Progressing  Flowsheets (Taken 08/02/2022 0534)  Pain at adequate level as identified by  patient: Identify patient comfort function goal     Problem: Side Effects from Pain Analgesia  Goal: Patient will experience minimal side effects of analgesic therapy  Outcome: Progressing  Flowsheets (Taken 08/02/2022 0534)  Patient will experience minimal side effects of analgesic therapy: Monitor/assess patient's respiratory status (RR depth, effort, breath sounds)     Problem: Moderate/High Fall Risk Score >5  Goal: Patient will remain free of falls  Outcome: Progressing  Flowsheets (Taken 08/01/2022 2000)  High (Greater than 13):   HIGH-Visual cue at entrance to patient's room   HIGH-Bed alarm on at all times while patient in bed   HIGH-Utilize chair pad alarm for patient while in the chair   HIGH-Apply  yellow "Fall Risk" arm band   HIGH-Initiate use of floor mats as appropriate   HIGH-Consider use of low bed     Problem: Diabetes: Glucose Imbalance  Goal: Blood glucose stable at established goal  Outcome: Progressing  Flowsheets (Taken 08/02/2022 0534)  Blood glucose stable at established goal:   Monitor lab values   Monitor/assess vital signs

## 2022-08-02 NOTE — Progress Notes (Addendum)
2:30 update  DELAY   [place] Order placed 12/01 @ 5:34 pm    SW reached [place] ( [facility] Admissions representative) PH# [phone]    Per [place], business office at the above named facility has reached out to pt's niece Ms Jannifer Franklin:     PH# [phone]  ( attempted today as well per report )   as business needs additional financial information for patient    SW reached pt's niece Ms Jannifer Franklin @ 2:30pm    Per Ms Jannifer Franklin, she spoke w/ pt and is trying to get the information the facility is requesting    "It is like pulling teeth" Ms Jannifer Franklin stated in regards to try and get the needed information.    Ms Jannifer Franklin reports pt also kept saying " I thought you were coming to get me to take me home "    Niece in process of trying to get the necessary information:        1) pt's life insurance policy    2) checking account statements    Niece willing for pt to come to her home although niece is concerned that   pt's significant other is not an appropriate/ safe person.    Niece verbalizes many concerns that significant other will come to her home and that SO is not "safe"     CM con't to follow for DCP needs    Jamse Belfast , MSW  [phone]

## 2022-08-02 NOTE — Plan of Care (Signed)
NURSING SHIFT NOTE     Patient: Meredith Baker  Day: 9      SHIFT EVENTS     Shift Narrative/Significant Events (PRN med administration, fall, RRT, etc.):     Patient is alert and oriented to self, place sometimes on time, awake and assisted when needed, blood pressure monitored and due medications givenSafety and fall precautions remain in place. Purposeful rounding completed.     Still awaiting for placement, patient updated about the plan of care.        ASSESSMENT     Changes in assessment from patient's baseline this shift:    Neuro: No  CV: No  Pulm: No  Peripheral Vascular: No  HEENT: No  GI: No  BM during shift: Yes   , Last BM: Last BM Date: 08/02/22  GU: No   Integ: No  MS: No    Pain:   Pain Interventions:   Medications Utilized:     Mobility: PMP Activity: Step 1 - Bedrest, Step 3 - Bed Mobility of Distance Walked (ft) (Step 6,7): 0 Feet           Lines     Patient Lines/Drains/Airways Status       Active Lines, Drains and Airways       Name Placement date Placement time Site Days    Peripheral IV 07/24/22 20 G Anterior;Left Forearm 07/24/22  0030  Forearm  9    External Urinary Catheter 07/23/22  1602  --  10                         VITAL SIGNS     Vitals:    08/02/22 1526   BP: 138/69   Pulse: 99   Resp: 14   Temp: 97.3 F (36.3 C)   SpO2: 98%       Temp  Min: 97.3 F (36.3 C)  Max: 97.9 F (36.6 C)  Pulse  Min: 91  Max: 112  Resp  Min: 14  Max: 16  BP  Min: 90/48  Max: 145/86  SpO2  Min: 94 %  Max: 100 %      Intake/Output Summary (Last 24 hours) at 08/02/2022 1846  Last data filed at 08/02/2022 1813  Gross per 24 hour   Intake 120 ml   Output 550 ml   Net -430 ml                  CARE PLAN         Problem: Pain interferes with ability to perform ADL  Goal: Pain at adequate level as identified by patient  Outcome: Progressing  Flowsheets (Taken 08/02/2022 1843)  Pain at adequate level as identified by patient:   Identify patient comfort function goal   Assess pain on admission, during daily  assessment and/or before any "as needed" intervention(s)   Evaluate if patient comfort function goal is met   Evaluate patient's satisfaction with pain management progress   Offer non-pharmacological pain management interventions   Include patient/patient care companion in decisions related to pain management as needed     Problem: Moderate/High Fall Risk Score >5  Goal: Patient will remain free of falls  08/02/2022 1843 by Delice Bison, RN  Outcome: Progressing  Flowsheets (Taken 07/29/2022 1207 by Mechele Dawley, RN)  VH High Risk (Greater than 13):   ALL REQUIRED LOW INTERVENTIONS   ALL REQUIRED MODERATE INTERVENTIONS   BED ALARM WILL BE ACTIVATED WHEN  THE PATEINT IS IN BED WITH SIGNAGE "RESET BED ALARM"   Include family/significant other in multidisciplinary discussion regarding plan of care as appropriate   Request PT/OT therapy consult order from physician for patients with gait/mobility impairment   Use of floor mat  08/02/2022 1629 by Delice Bison, RN  Outcome: Progressing     Problem: Compromised Tissue integrity  Goal: Damaged tissue is healing and protected  08/02/2022 1843 by Delice Bison, RN  Outcome: Progressing  Flowsheets (Taken 08/02/2022 1843)  Damaged tissue is healing and protected:   Monitor/assess Braden scale every shift   Reposition patient every 2 hours and as needed unless able to reposition self   Avoid shearing injuries   Relieve pressure to bony prominences for patients at moderate and high risk   Use incontinence wipes for cleaning urine, stool and caustic drainage. Foley care as needed   Monitor external devices/tubes for correct placement to prevent pressure, friction and shearing   Monitor patient's hygiene practices  08/02/2022 1629 by Delice Bison, RN  Outcome: Progressing  Goal: Nutritional status is improving  08/02/2022 1843 by Delice Bison, RN  Outcome: Progressing  Flowsheets (Taken 08/02/2022 1843)  Nutritional status is improving:   Assist  patient with eating   Allow adequate time for meals   Include patient/patient care companion in decisions related to nutrition   Encourage patient to take dietary supplement(s) as ordered  08/02/2022 1629 by Delice Bison, RN  Outcome: Progressing     Problem: Safety  Goal: Patient will be free from injury during hospitalization  08/02/2022 1843 by Delice Bison, RN  Outcome: Progressing  Flowsheets (Taken 08/01/2022 1833)  Patient will be free from injury during hospitalization:   Assess patient's risk for falls and implement fall prevention plan of care per policy   Use appropriate transfer methods   Hourly rounding   Provide and maintain safe environment   Ensure appropriate safety devices are available at the bedside   Include patient/ family/ care giver in decisions related to safety  08/02/2022 1629 by Delice Bison, RN  Outcome: Progressing  Goal: Patient will be free from infection during hospitalization  08/02/2022 1843 by Delice Bison, RN  Outcome: Progressing  Flowsheets (Taken 08/01/2022 1833)  Free from Infection during hospitalization:   Assess and monitor for signs and symptoms of infection   Monitor lab/diagnostic results   Encourage patient and family to use good hand hygiene technique  08/02/2022 1629 by Delice Bison, RN  Outcome: Progressing     Problem: Fluid and Electrolyte Imbalance/ Endocrine  Goal: Fluid and electrolyte balance are achieved/maintained  08/02/2022 1843 by Delice Bison, RN  Outcome: Progressing  Flowsheets (Taken 08/01/2022 1833)  Fluid and electrolyte balance are achieved/maintained:   Monitor/assess lab values and report abnormal values   Monitor for muscle weakness   Assess and reassess fluid and electrolyte status   Observe for cardiac arrhythmias  08/02/2022 1629 by Delice Bison, RN  Outcome: Progressing  Goal: Adequate hydration  08/02/2022 1843 by Delice Bison, RN  Outcome: Progressing  Flowsheets (Taken 08/02/2022 1843)  Adequate  hydration:   Monitor and assess vital signs and perfusion   Assess for peripheral, sacral, periorbital and abdominal edema   Assess mucus membranes, skin color, turgor, perfusion and presence of edema  08/02/2022 1629 by Delice Bison, RN  Outcome: Progressing     Problem: Diabetes: Glucose Imbalance  Goal: Blood glucose stable at established goal  08/02/2022 1843 by Delice Bison, RN  Outcome: Progressing  Flowsheets (Taken 08/02/2022 1843)  Blood glucose stable at established goal:   Monitor lab values   Include patient/family in decisions related to nutrition/dietary selections   Coordinate medication administration with meals, as indicated   Assess for hypoglycemia /hyperglycemia   Monitor intake and output.  Notify LIP if urine output is < 30 mL/hour.   Monitor/assess vital signs   Ensure appropriate diet and assess tolerance   Ensure adequate hydration  08/02/2022 1629 by Delice Bison, RN  Outcome: Progressing     Problem: Bladder/Voiding  Goal: Patient will experience proper bladder emptying during admission and remain free from infection  Outcome: Progressing

## 2022-08-02 NOTE — OT Progress Note (Signed)
Occupational Therapy Treatment  [place] Acute [facility] Therapy Recommendations   Discharge Recommendations:  SNF    DME needs IF patient is discharging home: No additional equipment/DME recommended at this time    Therapy discharge recommendations may change with patient status.  Please refer to most recent note for up-to-date recommendations.    Unit: [facility]  Bed: A2502/A2502-B    ___________________________________________________    Time of treatment:  OT Received On: 08/02/22  Start Time: 1330  Stop Time: 1400  Time Calculation (min): 30 min       Chart Review and Collaboration with Care Team: 5 minutes, not included in above time.    OT Visit Number: 4      Precautions and Contraindications:    Precautions  Weight Bearing Status: no restrictions  Other Precautions: Bleeding & Sores    Personal Protective Equipment (PPE)  gloves and procedure mask    Updated Labs:  Lab Results   Component Value Date/Time    HGB 8.8 (L) 08/01/2022 03:12 AM    HCT 28.2 (L) 08/01/2022 03:12 AM    K 4.4 07/29/2022 02:54 AM    NA 135 07/29/2022 02:54 AM    INR 1.6 (H) 07/24/2022 01:17 AM    TROPI 103.4 (AA) 07/24/2022 06:38 AM    TROPI 108.5 (AA) 07/24/2022 12:26 AM    TROPI calc n/a 07/24/2022 12:26 AM    TROPI 92.0 (AA) 07/23/2022 04:48 PM       All imaging reviewed, please see chart for details.    Subjective: "I want to lay on my side."     Patient Goal: "sit up"    Patient's medical condition is appropriate for Occupational Therapy intervention at this time.  Patient is agreeable to participation in the therapy session. Nursing clears patient for therapy.  Pain Assessment  Pain Assessment: No/denies pain      Objective:  Observation of Patient/Vital Signs:  Stable      Cognition/Neuro Status  Arousal/Alertness: Appropriate responses to stimuli  Attention Span: Appears intact  Orientation Level: Oriented X4  Memory: Appears intact  Following Commands: Follows all commands and directions without  difficulty  Safety Awareness: independent  Insights: Fully aware of deficits;Educated in safety awareness  Problem Solving: Able to problem solve independently  Behavior: calm;cooperative;attentive    Functional Mobility  Rolling: Moderate Assist;to left (Pt propped up on left side in order to relieve pressure)  Scooting Transfers: Moderate Assist;additional time (to [facility] utilizing overhead grab bars)  Supine to Sit Transfers: Maximal Assist;additional time  Sit to Supine Transfers: Maximal Assist;additional time    Self-care and Home Management  Grooming: Contact Guard Assist;in bed;wash/dry face;wash/dry hands          Educated the Patient to role of occupational therapy, plan of care, goals of therapy and safety with mobility and ADLs, energy conservation techniques with verbalized understanding  and demonstrated understanding.     Patient left in bed with alarm and all other medical equipment in place and call bell and all personal items/needs within reach.  RN notified of session outcome.         Assessment: Pt is progressing towards OT goals. Main limitations are decreased activity endurance, decreased functional mobility, weakness and fatigue limiting participation in functional mobility and ADL tasks. Pt has demonstrated an increase in sitting balance and activity endurance. Pt would benefit from continued OT services in order to maximize potential. Therapist continues to recommend  [place] to SNF              PMP Activity: Step 4 - Dangle at Bedside  Distance Walked (ft) (Step 6,7): 0 Feet      Plan:  OT Frequency Recommended: 2-3x/wk  Goal Formulation: Patient      Time For Goal Achievement: by time of discharge  ADL Goals  Patient will feed self: Independent, Goal met  Patient will groom self: Independent, Not met  Mobility and Transfer Goals  Other Goal: Patient will transitioned supine <> EOB c Min A; not met                       Continue plan of care.      Theresia Lo, COTA/L    Physical Medicine and  [facility]  [phone]    08/02/2022  3:22 PM    [facility]  Patient: Meredith Baker MRN#: [id number]   Unit: [place] [facility] Bed: A2502/A2502-B

## 2022-08-03 LAB — CBC
Absolute NRBC: 0 10*3/uL (ref 0.00–0.00)
Hematocrit: 26 % — ABNORMAL LOW (ref 34.7–43.7)
Hgb: 8.2 g/dL — ABNORMAL LOW (ref 11.4–14.8)
MCH: 31.9 pg (ref 25.1–33.5)
MCHC: 31.5 g/dL (ref 31.5–35.8)
MCV: 101.2 fL — ABNORMAL HIGH (ref 78.0–96.0)
MPV: 10.1 fL (ref 8.9–12.5)
Nucleated RBC: 0 /100 WBC (ref 0.0–0.0)
Platelets: 442 10*3/uL — ABNORMAL HIGH (ref 142–346)
RBC: 2.57 10*6/uL — ABNORMAL LOW (ref 3.90–5.10)
RDW: 19 % — ABNORMAL HIGH (ref 11–15)
WBC: 8.89 10*3/uL (ref 3.10–9.50)

## 2022-08-03 LAB — GLUCOSE WHOLE BLOOD - POCT
Whole Blood Glucose POCT: 153 mg/dL — ABNORMAL HIGH (ref 70–100)
Whole Blood Glucose POCT: 136 mg/dL — ABNORMAL HIGH (ref 70–100)
Whole Blood Glucose POCT: 102 mg/dL — ABNORMAL HIGH (ref 70–100)
Whole Blood Glucose POCT: 266 mg/dL — ABNORMAL HIGH (ref 70–100)

## 2022-08-03 NOTE — Plan of Care (Signed)
Patient received conscious, oriented, cooperative.  Has spontaneous eye opening, moves freely in bed, obeys command.  On room air, stable vital signs.  No other complain        Problem: Pain interferes with ability to perform ADL  Goal: Pain at adequate level as identified by patient  Outcome: Progressing  Flowsheets (Taken 08/02/2022 1843 by Delice Bison, RN)  Pain at adequate level as identified by patient:   Identify patient comfort function goal   Assess pain on admission, during daily assessment and/or before any "as needed" intervention(s)   Evaluate if patient comfort function goal is met   Evaluate patient's satisfaction with pain management progress   Offer non-pharmacological pain management interventions   Include patient/patient care companion in decisions related to pain management as needed     Problem: Side Effects from Pain Analgesia  Goal: Patient will experience minimal side effects of analgesic therapy  Outcome: Progressing  Flowsheets (Taken 08/02/2022 0534 by Derrill Memo, RN)  Patient will experience minimal side effects of analgesic therapy: Monitor/assess patient's respiratory status (RR depth, effort, breath sounds)     Problem: Moderate/High Fall Risk Score >5  Goal: Patient will remain free of falls  Outcome: Progressing  Flowsheets  Taken 08/02/2022 2020 by Minerva Fester, RN  High (Greater than 13):   HIGH-Consider use of low bed   HIGH-Initiate use of floor mats as appropriate   HIGH-Apply yellow "Fall Risk" arm band   HIGH-Bed alarm on at all times while patient in bed   HIGH-Visual cue at entrance to patient's room  Taken 08/02/2022 1600 by Delice Bison, RN  Moderate Risk (6-13):   MOD- Consider video monitoring   MOD-Use gait belt when appropriate   MOD-Perform dangle, stand, walk (DSW) prior to mobilization   MOD-Utilize diversion activities   MOD-Re-orient confused patients   MOD-Floor mat at bedside (where available) if appropriate   MOD-Apply bed exit alarm if  patient is confused  Taken 07/29/2022 1207 by Mechele Dawley, RN  VH High Risk (Greater than 13):   ALL REQUIRED LOW INTERVENTIONS   ALL REQUIRED MODERATE INTERVENTIONS   BED ALARM WILL BE ACTIVATED WHEN THE PATEINT IS IN BED WITH SIGNAGE "RESET BED ALARM"   Include family/significant other in multidisciplinary discussion regarding plan of care as appropriate   Request PT/OT therapy consult order from physician for patients with gait/mobility impairment   Use of floor mat     Problem: Compromised Tissue integrity  Goal: Damaged tissue is healing and protected  Outcome: Progressing  Flowsheets (Taken 08/02/2022 2020 by Minerva Fester, RN)  Damaged tissue is healing and protected:   Monitor/assess Braden scale every shift   Reposition patient every 2 hours and as needed unless able to reposition self   Increase activity as tolerated/progressive mobility   Relieve pressure to bony prominences for patients at moderate and high risk   Avoid shearing injuries   Keep intact skin clean and dry   Use bath wipes, not soap and water, for daily bathing  Goal: Nutritional status is improving  Outcome: Progressing  Flowsheets (Taken 08/02/2022 2020 by Minerva Fester, RN)  Nutritional status is improving:   Assist patient with eating   Allow adequate time for meals     Problem: Safety  Goal: Patient will be free from injury during hospitalization  Outcome: Progressing  Flowsheets (Taken 08/01/2022 1833 by Delice Bison, RN)  Patient will be free from injury during hospitalization:   Assess patient's risk for falls  and implement fall prevention plan of care per policy   Use appropriate transfer methods   Hourly rounding   Provide and maintain safe environment   Ensure appropriate safety devices are available at the bedside   Include patient/ family/ care giver in decisions related to safety  Goal: Patient will be free from infection during hospitalization  Outcome: Progressing  Flowsheets (Taken 08/01/2022 1833 by  Delice Bison, RN)  Free from Infection during hospitalization:   Assess and monitor for signs and symptoms of infection   Monitor lab/diagnostic results   Encourage patient and family to use good hand hygiene technique     Problem: Fluid and Electrolyte Imbalance/ Endocrine  Goal: Fluid and electrolyte balance are achieved/maintained  Outcome: Progressing  Flowsheets (Taken 08/01/2022 1833 by Delice Bison, RN)  Fluid and electrolyte balance are achieved/maintained:   Monitor/assess lab values and report abnormal values   Monitor for muscle weakness   Assess and reassess fluid and electrolyte status   Observe for cardiac arrhythmias  Goal: Adequate hydration  Outcome: Progressing  Flowsheets (Taken 08/02/2022 1843 by Delice Bison, RN)  Adequate hydration:   Monitor and assess vital signs and perfusion   Assess for peripheral, sacral, periorbital and abdominal edema   Assess mucus membranes, skin color, turgor, perfusion and presence of edema     Problem: Everyday - Pneumonia  Goal: Stable Vital Signs and Fluid Balance  Outcome: Progressing  Flowsheets (Taken 07/29/2022 1207 by Mechele Dawley, RN)  Stable Vital Signs and Fluid Balance:   Monitor/ assess vital signs and telemetry per policy   Monitor labs and report abnormalities to physicians  Goal: Mobility/Activity is Maintained at Optimal Level for Patient  Outcome: Progressing  Flowsheets (Taken 07/25/2022 0141 by Azell Der, RN)  Mobility/Activity is Maintained at Optimal Level for Patient:   Increase mobility as tolerated using Dangle/Stand/Walk and progressive mobility   Reposition patient every 2 hours and as needed unless able to reposition self   Perform active/passive ROM     Problem: Hemodynamic Status: Cardiac  Goal: Stable vital signs and fluid balance  Outcome: Progressing  Flowsheets (Taken 07/26/2022 2107 by Elodia Florence, RN)  Stable vital signs and fluid balance:   Assess signs and symptoms associated with cardiac  rhythm changes   Monitor lab values     Problem: Diabetes: Glucose Imbalance  Goal: Blood glucose stable at established goal  Outcome: Progressing  Flowsheets (Taken 08/02/2022 1843 by Delice Bison, RN)  Blood glucose stable at established goal:   Monitor lab values   Include patient/family in decisions related to nutrition/dietary selections   Coordinate medication administration with meals, as indicated   Assess for hypoglycemia /hyperglycemia   Monitor intake and output.  Notify LIP if urine output is < 30 mL/hour.   Monitor/assess vital signs   Ensure appropriate diet and assess tolerance   Ensure adequate hydration

## 2022-08-03 NOTE — Plan of Care (Addendum)
Shift Note:      Orientation: AOx 3-4, sometimes not to time  Oxygen: RA  Ambulation: BM  Pain: gen body aches relieved by tylenol  Lines/Drips: L FA 20  GI/GU: ext cath. LBM 12/6  Fall Score: HIGH         Problem: Pain interferes with ability to perform ADL  Goal: Pain at adequate level as identified by patient  Outcome: Progressing     Problem: Side Effects from Pain Analgesia  Goal: Patient will experience minimal side effects of analgesic therapy  Outcome: Progressing     Problem: Moderate/High Fall Risk Score >5  Goal: Patient will remain free of falls  Outcome: Progressing     Problem: Compromised Tissue integrity  Goal: Damaged tissue is healing and protected  Outcome: Progressing  Goal: Nutritional status is improving  Outcome: Progressing     Problem: Safety  Goal: Patient will be free from injury during hospitalization  Outcome: Progressing  Goal: Patient will be free from infection during hospitalization  Outcome: Progressing     Problem: Fluid and Electrolyte Imbalance/ Endocrine  Goal: Fluid and electrolyte balance are achieved/maintained  Outcome: Progressing  Goal: Adequate hydration  Outcome: Progressing     Problem: Day of Admission - Pneumonia  Goal: Pneumonia Admission  Outcome: Progressing     Problem: Everyday - Pneumonia  Goal: Stable Vital Signs and Fluid Balance  Outcome: Progressing  Goal: Mobility/Activity is Maintained at Optimal Level for Patient  Outcome: Progressing     Problem: Day of Discharge - Pneumonia  Goal: Discharge Education  Outcome: Progressing     Problem: Hemodynamic Status: Cardiac  Goal: Stable vital signs and fluid balance  Outcome: Progressing     Problem: Bladder/Voiding  Goal: Patient will experience proper bladder emptying during admission and remain free from infection  Outcome: Progressing     Problem: Diabetes: Glucose Imbalance  Goal: Blood glucose stable at established goal  Outcome: Progressing

## 2022-08-03 NOTE — Progress Notes (Addendum)
LOS # 10      Summary of Discharge Plan: D/C delay. LTC SNF- [facility] [place] Possible Discharge Barriers: Placement- Patient's life insurance policy number and member number for [employer] obtained by [employer]. Information given to [place] ([facility] Admissions representative) PH# [phone] with consent of patient. [place] stated that she has sent over information to the business office. The bed that was available for the patient was given to someone else. She said she may have a bed available Friday. Niece updated.    VM left for patient niece 11:45am. Spoke with the patient's niece 3:21p.    CM Interventions and Outcome: Chart review and obtaining information needed for LTC bed at SNF.       Discussed above Discharge Plan with (patient, family, Care Team, others):Patient, family member, care team and attending.       Case Management will continue to following on patient's discharge needs.    Dorcas Mcmurray RN, BSN  Case Manager I  [phone]  Field [email or web address]

## 2022-08-03 NOTE — Discharge Summary (Signed)
[facility]      Patient: Meredith Baker  Admission Date: 07/23/2022   DOB: 12-31-1945  Discharge Date: 08/03/2022    MRN: [id number]  Discharge Attending: Vernona Rieger, MD     Referring Physician: Pcp, None, MD  PCP: Pcp, None, MD       DISCHARGE SUMMARY     Discharge Information   Admission Diagnosis:   NSTEMI (non-ST elevated myocardial infarction)    Discharge Diagnosis:   Active Hospital Problems    Diagnosis    NSTEMI (non-ST elevated myocardial infarction)        Admission Condition: Guarded  Discharge Condition: Stable and improved  Consultants: Cardiology/pathology and gastroenterology  Functional Status: Bedbound  Discharged to: SNF  08-03-22: Multidisciplinary conversation involving myself, case management, nurse, patient, we spoke on the speakerphone with the contact at the nursing home to try to address any deficiencies in the patient's application so that her skilled nursing facility placement could be facilitated.  Greatly appreciate case management's assistance       Hospital Course   Presentation History   76 years old female with history of hypertension, type 2 diabetes, hyperlipidemia, osteoarthritis, bedbound, history of bilateral DVT who presented for evaluation of generalized weakness presented for weakness and inability to care for herself admitted with a diagnosis of UTI and NSTEMI cardiology on board, Noted trended down H/H.    See HPI for details.    Hospital Course (10 Days)     Anemia  Hemoglobin has been dropping from  From 12-10--8.2  I have ordered H&H monitor every 6 hours  Fecal occult blood test  Given patient has history of DVT I will continue the anticoagulation for now  Fecal occult blood test  Will monitor closely  No obvious bleeding noted  11/29: So far H&H stable between 8-9, continue heparin drip, pending hematology evaluation) final recommendation, fecal occult blood test pending, patient had 1 bowel movement yesterday which is brown, no obvious bleeding  noted  11/30: H&H stayed stable, patient switched back to Pradaxa per heme-onc recommendation, fecal occult blood test came positive, GI consulted to weigh in, started on PPI  12/1-12/5: Discussed with GI, recommended outpatient EGD colonoscopy and Protonix 40 mg twice daily for a month followed by daily, H&H remained stable in fact uptrending plan communicated with the patient as well as niece    Sepsis likely from UTI  Presented with leukocytosis and tachycardia  CT scan of the chest showed small left pleural effusion and evidence of pulmonary hypertension  UA consistent with UTI  Blood culture and urine culture came negative  Initially started on empirical broad-spectrum antibiotic vancomycin and Zosyn  MRSA test is negative  CT abdomen showed nonobstructive stone and small bladder stone  Antibiotic de-escalated to ceftriaxone  Leukocytosis improved  11/29-11/30: Patient will complete 5 days course of antibiotics today        History of DVT  Patient had bilateral lower extremity DVT back in September 2023 currently on Pradaxa  Repeat venous ultrasound showed left femoral popliteal DVT extending to the left peroneal vein and posterior tibialis vein with associated mild soft tissue swelling/edema and short segment nonocclusive thrombus within the right common femoral vein  I will involve hematology to guide on treatment  11/29: CTA ruled out PE, for now we will continue heparin drip, will switch her back to [place] once hematology clears  11/30: Patient is a switch to Pradaxa after hematology cleared the patient  12/1-12/5: Patient tolerated Pradaxa, outpatient follow-up with hematology  NSTEMI  Elevated troponin  Likely demand ischemia  Troponin trended up from 90-108  Patient is on aspirin, statin and Pradaxa  Cardiology on board, recommended to get CTA  Follow-up recommendation  11/29-12/5: 2D echo showed normal EF, no LV wall motion abnormality, elevated troponin is likely from demand ischemia and anemia,  continue aspirin/statin/Coreg/losartan.  Outpatient follow-up with cardiology        Chronic stable medical condition  Hypertension: Continue amlodipine/metoprolol/losartan  11/29-12/5: Blood pressure within acceptable range, outpatient follow-up with PCP     Type 2 diabetes  Carb controlled diet  Sliding scale  Hypoglycemia protocol  12/1-12/5: Increase Lantus to 10, continue sliding scale, and carb controlled diet.  12/5: Blood sugar low 200s, I have increased the Lantus to 12, continue to monitor POC    Morbid obesity  Outpatient follow-up     Bedbound  Wound on her left iliac area  Wound nurse consult       Procedures/Imaging:   Upon my review: Negative except discussed above         Progress Note/Physical Exam at Discharge     Subjective:Patient is stable for discharge.    Vitals:    08/03/22 0740 08/03/22 0841 08/03/22 1121 08/03/22 1635   BP: 128/77 128/77 121/69 155/71   Pulse: 89 89 90 74   Resp: '19  19 19   '$ Temp: 98.1 F (36.7 C)  97.9 F (36.6 C) 98.1 F (36.7 C)   TempSrc: Oral  Oral Oral   SpO2: 99%  99% 98%   Weight: 110.8 kg (244 lb 4.3 oz)      Height:               General: Chronically sick looking, NAD, AAOx3  HEENT: Sclera anicteric, no conjunctival injection, OP: Clear, MMM  Neck: Supple, FROM, no LAD  Cardiovascular: RRR, no m/r/g  Lungs: CTAB, no w/r/r  Abdomen: Soft, +BS, NT/ND, no masses, no g/r  Extremities: No C/C/E  Skin: No rashes or lesions noted  Neuro: Weak all over but able to follow commands and move all extremities       Diagnostics     Labs/Studies Pending at Discharge: Yes outpatient EGD and colonoscopy    Last Labs   Recent Labs   Lab 08/03/22  0422 08/01/22  0312 07/30/22  0324   WBC 8.89 9.96* 9.76*   RBC 2.57* 2.78* 3.04*   Hgb 8.2* 8.8* 9.3*   Hematocrit 26.0* 28.2* 30.3*   MCV 101.2* 101.4* 99.7*   Platelets 442* 550* 495*         Recent Labs   Lab 07/29/22  0254 07/28/22  0908   Sodium 135 136   Potassium 4.4 4.5   Chloride 104 107   CO2 26 23   BUN 13.0 11.0    Creatinine 0.7 0.7   Glucose 284* 189*   Calcium 8.7 8.5   Magnesium  --  1.6         Microbiology Results (last 15 days)       Procedure Component Value Units Date/Time    MRSA culture [id number] Collected: 07/24/22 1932    Order Status: Completed Specimen: Culturette from Nasal Swab Updated: 07/26/22 0214     Culture MRSA Surveillance Negative for Methicillin Resistant Staph aureus    MRSA culture [id number] Collected: 07/24/22 1932    Order Status: Completed Specimen: Culturette from Throat Updated: 07/26/22 0214     Culture MRSA Surveillance Negative for Methicillin Resistant Staph aureus  Urine culture [id number] Collected: 07/24/22 1932    Order Status: Completed Specimen: Bladder Updated: 07/26/22 0413    Narrative:      ORDER#: [id number]                                    ORDERED BY: Enis Gash  SOURCE: Urine                                        COLLECTED:  07/24/22 19:32  ANTIBIOTICS AT COLL.:                                RECEIVED :  07/24/22 20:10  Culture Urine                              FINAL       07/26/22 04:13  07/26/22   No growth of >1,000 CFU/ML, No further work      Culture Blood Aerobic and Anaerobic [id number] Collected: 07/23/22 2218    Order Status: Completed Specimen: Blood, Venipuncture Updated: 07/29/22 0421    Narrative:      The order will result in two separate 8-4m bottles  Please do NOT order repeat blood cultures if one has been  drawn within the last 48 hours  UNLESS concerned for  endocarditis  AVOID BLOOD CULTURE DRAWS FROM CENTRAL LINE IF POSSIBLE  Indications:->Sepsis  ORDER#: [id number]                                   ORDERED BY: BEnis Gash SOURCE: Blood, Venipuncture                          COLLECTED:  07/23/22 22:18  ANTIBIOTICS AT COLL.:                                RECEIVED :  07/24/22 01:42  Culture Blood Aerobic and Anaerobic        FINAL       07/29/22 04:21  07/29/22   No growth after 5 days of incubation.      Culture Blood Aerobic and  Anaerobic [[id number]: 07/23/22 2218    Order Status: Completed Specimen: Blood, Venipuncture Updated: 07/29/22 0421    Narrative:      The order will result in two separate 8-122mbottles  Please do NOT order repeat blood cultures if one has been  drawn within the last 48 hours  UNLESS concerned for  endocarditis  AVOID BLOOD CULTURE DRAWS FROM CENTRAL LINE IF POSSIBLE  Indications:->Sepsis  ORDER#: [id number]                                  ORDERED BY: BREnis GashSOURCE: Blood, Venipuncture                          COLLECTED:  07/23/22 22:18  ANTIBIOTICS AT COLL.:  RECEIVED :  07/24/22 01:42  Culture Blood Aerobic and Anaerobic        FINAL       07/29/22 04:21  07/29/22   No growth after 5 days of incubation.      COVID-19 (SARS-CoV-2) and Influenza A/B, NAA (Liat Rapid) [id number] Collected: 07/23/22 2218    Order Status: Completed Specimen: Culturette from Nasopharyngeal Updated: 07/23/22 2259     Purpose of COVID testing Diagnostic -[facility]I     SARS-CoV-2 Specimen Source Nasal Swab     SARS CoV 2 Overall Result Not Detected     Comment: __________________________________________________  -A result of "Detected" indicates POSITIVE for the    presence of SARS CoV-2 RNA  -A result of "Not Detected" indicates NEGATIVE for the    presence of SARS CoV-2 RNA  __________________________________________________________  Test performed using the Roche cobas Liat SARS-CoV-2 assay. This assay is  only for use under the Food and Drug Administration's Emergency Use  Authorization. This is a real-time RT-PCR assay for the qualitative  detection of SARS-CoV-2 RNA. Viral nucleic acids may persist in vivo,  independent of viability. Detection of viral nucleic acid does not imply the  presence of infectious virus, or that virus nucleic acid is the cause of  clinical symptoms. Negative results do not preclude SARS-CoV-2 infection and  should not be used as the sole basis for diagnosis,  treatment or other  patient management decisions. Negative results must be combined with  clinical observations, patient history, and/or epidemiological information.  Invalid results may be due to inhibiting substances in the specimen and  recollection should occur. Please see Fact Sheets for patients and providers  located:  https://www.benson-chung.com/          Influenza A Not Detected     Influenza B Not Detected     Comment: Test performed using the Roche cobas Liat SARS-CoV-2 & Influenza A/B assay.  This assay is only for use under the Food and Drug Administration's  Emergency Use Authorization. This is a multiplex real-time RT-PCR assay  intended for the simultaneous in vitro qualitative detection and  differentiation of SARS-CoV-2, influenza A, and influenza B virus RNA. Viral  nucleic acids may persist in vivo, independent of viability. Detection of  viral nucleic acid does not imply the presence of infectious virus, or that  virus nucleic acid is the cause of clinical symptoms. Negative results do  not preclude SARS-CoV-2, influenza A, and/or influenza B infection and  should not be used as the sole basis for diagnosis, treatment or other  patient management decisions. Negative results must be combined with  clinical observations, patient history, and/or epidemiological information.  Invalid results may be due to inhibiting substances in the specimen and  recollection should occur. Please see Fact Sheets for patients and providers  located: http://olson-hall.info/.         Narrative:      o Collect and clearly label specimen type:  o PREFERRED-Upper respiratory specimen: One Nasal Swab in  Transport Media.  o Hand deliver to laboratory ASAP  Diagnostic -[facility]I             Patient Instructions   Discharge Diet: Heart healthy diet  Discharge Activity: As tolerated    Follow Up Appointment:   Follow-up Information       West Carbo, MD Follow up in 1 week(s).     Specialties: Gastroenterology, Internal Medicine  Contact information:  [address]  [place] [address]  [phone]  Tia Alert, MD Follow up in 1 week(s).    Specialties: Medical Oncology, Hematology  Contact information:  [address]  [place] [address]  [phone]               Vonzell Schlatter, MD Follow up in 1 week(s).    Specialties: Interventional Cardiology, Internal Medicine, Cardiology  Contact information:  [place]  [place] [address]  [phone]               Pcp, None, MD .                              Discharge Medications:     Medication List        START taking these medications      aspirin 81 MG chewable tablet  Chew 1 tablet (81 mg) by mouth daily     atorvastatin 40 MG tablet  Commonly known as: LIPITOR  Take 1 tablet (40 mg) by mouth nightly     carboxymethylcellulose 0.5 % ophthalmic solution  Commonly known as: REFRESH TEARS  Place 1 drop into both eyes 3 (three) times daily as needed (dry eye)     insulin glargine 100 UNIT/ML injection  Commonly known as: LANTUS  Inject 12 Units into the skin nightly     pantoprazole 40 MG tablet  Commonly known as: PROTONIX  Take 1 tablet (40 mg) by mouth 2 (two) times daily for 30 days, THEN 1 tablet (40 mg) daily.  Start taking on: July 30, 2022            CONTINUE taking these medications      acetaminophen 500 MG tablet  Commonly known as: TYLENOL     CENTRUM SILVER 50+WOMEN PO     dabigatran 150 MG Caps  Commonly known as: PRADAXA     losartan 100 MG TABS, hydroCHLOROthiazide 25 MG TABS     metoprolol succinate 200 MG 24 hr tablet  Commonly known as: TOPROL-XL     TURMERIC PO     valACYclovir 1000 MG tablet  Commonly known as: VALTREX     vitamin D (ergocalciferol) 50000 UNIT Caps  Commonly known as: DRISDOL            STOP taking these medications      amLODIPine 5 MG tablet  Commonly known as: NORVASC     HUMULIN 70/30 KWIKPEN SC               Where to Get Your Medications         These medications were sent to [facility] [place], [place] - [address]  [address] [place] [address]      Phone: [phone]   aspirin 81 MG chewable tablet  atorvastatin 40 MG tablet  pantoprazole 40 MG tablet       Information about where to get these medications is not yet available    Ask your nurse or doctor about these medications  carboxymethylcellulose 0.5 % ophthalmic solution  insulin glargine 100 UNIT/ML injection          Time spent examining patient, discussing with patient/family regarding hospital course, chart review, reconciling medications and discharge planning: 40 minutes.    Signed,  Danelle Earthly, MD    7:00 PM 08/03/2022

## 2022-08-03 NOTE — Plan of Care (Signed)
NURSING SHIFT NOTE     Patient: Meredith Baker  Day: 10      SHIFT EVENTS     Shift Narrative/Significant Events (PRN med administration, fall, RRT, etc.):     No acute change in status overnight. Vital signs stable. All scheduled meds administered as ordered.   Blood sugar monitored and covered accordingly.  Personal hygienic needs taken care of. Will continue to monitor further.  Safety and fall precautions remain in place. Purposeful rounding completed.          CARE PLAN        Problem: Compromised Tissue integrity  Goal: Damaged tissue is healing and protected  Outcome: Progressing  Flowsheets (Taken 08/03/2022 2342)  Damaged tissue is healing and protected:   Monitor/assess Braden scale every shift   Provide wound care per wound care algorithm   Reposition patient every 2 hours and as needed unless able to reposition self     Problem: Safety  Goal: Patient will be free from injury during hospitalization  Outcome: Progressing  Flowsheets (Taken 08/03/2022 2342)  Patient will be free from injury during hospitalization:   Assess patient's risk for falls and implement fall prevention plan of care per policy   Provide and maintain safe environment   Hourly rounding     Problem: Hemodynamic Status: Cardiac  Goal: Stable vital signs and fluid balance  Outcome: Progressing  Flowsheets (Taken 07/26/2022 2107)  Stable vital signs and fluid balance:   Assess signs and symptoms associated with cardiac rhythm changes   Monitor lab values     Problem: Diabetes: Glucose Imbalance  Goal: Blood glucose stable at established goal  Outcome: Progressing  Flowsheets (Taken 08/03/2022 2342)  Blood glucose stable at established goal:   Monitor lab values   Assess for hypoglycemia /hyperglycemia   Ensure patient/family has adequate teaching materials

## 2022-08-03 NOTE — UM Notes (Signed)
08/02/22, 08/03/22     08/02/22:  Cardiology:  Assessment:      Mildly elevated HS troponin I level, likely demand ischemia; chest pain-free  Abnormal EKG; right bundle branch block and left anterior fascicular block  Coronary calcification, seen on CTA chest; suggestive of CAD  Normal LV systolic function, with EF 60 to 65%  Mild pulmonic, mitral, and tricuspid regurgitation   Hypertension, hypertensive cardiovascular disease  Diabetes mellitus, A1c 6.0%  Hx of DVT/PE  Arthritis and being bed bound  Left lower extremity laterally rotated; Xray reports bilateral hip degenerative changes      Plan:   Aspirin 81 mg p.o. once a day  Atorvastatin 40 mg p.o. once a day  Carvedilol 12.5 mg p.o. twice a day  Losartan 100 mg p.o. once a day  Hydrochlorothiazide 25 mg p.o. once a day  On Pradaxa 150 mg p.o. twice a day, for hx of DVT  CBC and BMP level  Out of bed and sit in chair    Current Facility-Administered Medications   Medication Dose Route Frequency    aspirin  81 mg Oral Daily    atorvastatin  40 mg Oral QHS    balsam peru-castor oil (VENELEX)   Topical Q12H    carvedilol  12.5 mg Oral Q12H [place]    dabigatran  150 mg Oral Q12H [place]    gabapentin  200 mg Oral Q8H [facility]    insulin glargine  8 Units Subcutaneous QHS    insulin lispro  1-4 Units Subcutaneous QHS    insulin lispro  1-8 Units Subcutaneous TID AC    losartan (COZAAR) 100 mg, hydroCHLOROthiazide (HYDRODIURIL) 25 mg for HYZAAR   Oral Daily    miconazole 2 % with zinc oxide   Topical Q12H    pantoprazole  40 mg Oral BID    polyethylene glycol  17 g Oral Daily    senna-docusate  1 tablet Oral QHS       Physical Exam:   BP 100/63   Pulse 91   Temp 98.1 F (36.7 C) (Oral)   Resp 16   Ht 1.575 m ('5\' 2"'$ )   Wt 104.7 kg (230 lb 13.2 oz)   SpO2 99%   BMI 42.22 kg/m     See previous CM note. CM Called [place] ( [facility] ) # [phone] who confirmed that niece has submitted LTC paperwork. Niece to f/u with patient to confirm missing financial  information.       CSR 08/03/22    Nursing:Patient is Alert and Oriented x2/3. Patient denies of any pain, dizziness and SOB. Patient shows normal sinus rhythm on tele. Fall precautions in place. Patient's BP dipped to 90/48. Notified [place]. 563m bolus ordered and given. Pt tolerated well. No acute distress noted. Safety measures continue. Bed is locked and in lowest setting. Continue to monitor     Temp:  [97.3 F (36.3 C)-99 F (37.2 C)] 97.9 F (36.6 C)  Heart Rate:  [88-105] 90  Resp Rate:  [14-20] 19  BP: (114-138)/(59-77) 121/69     Scheduled Meds:  Current Facility-Administered Medications   Medication Dose Route Frequency    aspirin  81 mg Oral Daily    atorvastatin  40 mg Oral QHS    balsam peru-castor oil (VENELEX)   Topical Q12H    carvedilol  12.5 mg Oral Q12H [place]   dabigatran  150 mg Oral Q12H [facility]    gabapentin  200 mg [facility]  insulin glargine  12 Units Subcutaneous QHS    insulin lispro  1-4 Units Subcutaneous QHS    insulin lispro  1-8 Units Subcutaneous TID AC    losartan (COZAAR) 100 mg, hydroCHLOROthiazide (HYDRODIURIL) 25 mg for HYZAAR   Oral Daily    miconazole 2 % with zinc oxide   Topical Q12H    pantoprazole  40 mg Oral BID    polyethylene glycol  17 g Oral Daily    senna-docusate  1 tablet Oral QHS     Continuous Infusions:  PRN Meds:.acetaminophen, benzocaine-menthol, benzonatate, artificial tears (REFRESH PLUS), dextrose **OR** dextrose **OR** dextrose **OR** glucagon (rDNA), magnesium sulfate, melatonin, naloxone, ondansetron, potassium & sodium phosphates, potassium chloride **AND** potassium chloride, saline

## 2022-08-04 LAB — COMPREHENSIVE METABOLIC PANEL
ALT: 11 U/L (ref 0–55)
AST (SGOT): 15 U/L (ref 5–41)
Albumin/Globulin Ratio: 0.7 — ABNORMAL LOW (ref 0.9–2.2)
Albumin: 1.7 g/dL — ABNORMAL LOW (ref 3.5–5.0)
Alkaline Phosphatase: 48 U/L (ref 37–117)
Anion Gap: 8 (ref 5.0–15.0)
BUN: 19 mg/dL (ref 7.0–21.0)
Bilirubin, Total: 0.3 mg/dL (ref 0.2–1.2)
CO2: 29 mEq/L (ref 17–29)
Calcium: 9.1 mg/dL (ref 7.9–10.2)
Chloride: 104 mEq/L (ref 99–111)
Creatinine: 0.7 mg/dL (ref 0.4–1.0)
Globulin: 2.4 g/dL (ref 2.0–3.6)
Glucose: 191 mg/dL — ABNORMAL HIGH (ref 70–100)
Potassium: 4.4 mEq/L (ref 3.5–5.3)
Protein, Total: 4.1 g/dL — ABNORMAL LOW (ref 6.0–8.3)
Sodium: 141 mEq/L (ref 135–145)
eGFR: >60 mL/min/{1.73_m2} (ref >=60)

## 2022-08-04 LAB — GLUCOSE WHOLE BLOOD - POCT
Whole Blood Glucose POCT: 250 mg/dL — ABNORMAL HIGH (ref 70–100)
Whole Blood Glucose POCT: 149 mg/dL — ABNORMAL HIGH (ref 70–100)
Whole Blood Glucose POCT: 226 mg/dL — ABNORMAL HIGH (ref 70–100)
Whole Blood Glucose POCT: 287 mg/dL — ABNORMAL HIGH (ref 70–100)

## 2022-08-04 NOTE — Plan of Care (Signed)
NURSING SHIFT NOTE     Patient: Meredith Baker  Day: 11      SHIFT EVENTS     Shift Narrative/Significant Events (PRN med administration, fall, RRT, etc.):       No acute change in status overnight. Vital signs stable. All scheduled meds administered as ordered.   Blood sugar monitored and covered accordingly.  Personal hygienic needs taken care of. Will continue to monitor further.  Safety and fall precautions remain in place. Purposeful rounding completed.          CARE PLAN        Problem: Compromised Tissue integrity  Goal: Damaged tissue is healing and protected  Outcome: Progressing  Flowsheets (Taken 08/04/2022 2000)  Damaged tissue is healing and protected: Monitor/assess Braden scale every shift     Problem: Safety  Goal: Patient will be free from injury during hospitalization  Outcome: Progressing  Flowsheets (Taken 08/03/2022 2342)  Patient will be free from injury during hospitalization:   Assess patient's risk for falls and implement fall prevention plan of care per policy   Provide and maintain safe environment   Hourly rounding     Problem: Hemodynamic Status: Cardiac  Goal: Stable vital signs and fluid balance  Outcome: Progressing  Flowsheets (Taken 07/26/2022 2107)  Stable vital signs and fluid balance:   Assess signs and symptoms associated with cardiac rhythm changes   Monitor lab values     Problem: Diabetes: Glucose Imbalance  Goal: Blood glucose stable at established goal  Outcome: Progressing  Flowsheets (Taken 08/03/2022 2342)  Blood glucose stable at established goal:   Monitor lab values   Assess for hypoglycemia /hyperglycemia   Ensure patient/family has adequate teaching materials

## 2022-08-04 NOTE — Plan of Care (Signed)
Patient received conscious, oriented, cooperative.  Has spontaneous eye opening, moves freely in bed, obeys command.  On room air, stable vital signs.  No other complain              Problem: Pain interferes with ability to perform ADL  Goal: Pain at adequate level as identified by patient  Outcome: Progressing  Flowsheets (Taken 08/02/2022 1843 by Delice Bison, RN)  Pain at adequate level as identified by patient:   Identify patient comfort function goal   Assess pain on admission, during daily assessment and/or before any "as needed" intervention(s)   Evaluate if patient comfort function goal is met   Evaluate patient's satisfaction with pain management progress   Offer non-pharmacological pain management interventions   Include patient/patient care companion in decisions related to pain management as needed     Problem: Side Effects from Pain Analgesia  Goal: Patient will experience minimal side effects of analgesic therapy  Outcome: Progressing  Flowsheets (Taken 08/02/2022 0534 by Derrill Memo, RN)  Patient will experience minimal side effects of analgesic therapy: Monitor/assess patient's respiratory status (RR depth, effort, breath sounds)     Problem: Moderate/High Fall Risk Score >5  Goal: Patient will remain free of falls  Outcome: Progressing  Flowsheets  Taken 08/03/2022 2000 by Elodia Florence, RN  High (Greater than 13):   HIGH-Visual cue at entrance to patient's room   HIGH-Bed alarm on at all times while patient in bed   HIGH-Utilize chair pad alarm for patient while in the chair   HIGH-Apply yellow "Fall Risk" arm band   HIGH-Consider use of low bed  Taken 08/02/2022 1600 by Delice Bison, RN  Moderate Risk (6-13):   MOD- Consider video monitoring   MOD-Use gait belt when appropriate   MOD-Perform dangle, stand, walk (DSW) prior to mobilization   MOD-Utilize diversion activities   MOD-Re-orient confused patients   MOD-Floor mat at bedside (where available) if appropriate   MOD-Apply  bed exit alarm if patient is confused  Taken 07/29/2022 1207 by Mechele Dawley, RN  VH High Risk (Greater than 13):   ALL REQUIRED LOW INTERVENTIONS   ALL REQUIRED MODERATE INTERVENTIONS   BED ALARM WILL BE ACTIVATED WHEN THE PATEINT IS IN BED WITH SIGNAGE "RESET BED ALARM"   Include family/significant other in multidisciplinary discussion regarding plan of care as appropriate   Request PT/OT therapy consult order from physician for patients with gait/mobility impairment   Use of floor mat     Problem: Compromised Tissue integrity  Goal: Damaged tissue is healing and protected  Outcome: Progressing  Flowsheets (Taken 08/03/2022 2342 by Elodia Florence, RN)  Damaged tissue is healing and protected:   Monitor/assess Braden scale every shift   Provide wound care per wound care algorithm   Reposition patient every 2 hours and as needed unless able to reposition self  Goal: Nutritional status is improving  Outcome: Progressing  Flowsheets (Taken 08/02/2022 2020 by Minerva Fester, RN)  Nutritional status is improving:   Assist patient with eating   Allow adequate time for meals     Problem: Safety  Goal: Patient will be free from injury during hospitalization  Outcome: Progressing  Flowsheets (Taken 08/03/2022 2342 by Elodia Florence, RN)  Patient will be free from injury during hospitalization:   Assess patient's risk for falls and implement fall prevention plan of care per policy   Provide and maintain safe environment   Hourly rounding  Goal: Patient will be free from infection during  hospitalization  Outcome: Progressing  Flowsheets (Taken 08/01/2022 1833 by Delice Bison, RN)  Free from Infection during hospitalization:   Assess and monitor for signs and symptoms of infection   Monitor lab/diagnostic results   Encourage patient and family to use good hand hygiene technique     Problem: Fluid and Electrolyte Imbalance/ Endocrine  Goal: Fluid and electrolyte balance are achieved/maintained  Outcome:  Progressing  Flowsheets (Taken 08/01/2022 1833 by Delice Bison, RN)  Fluid and electrolyte balance are achieved/maintained:   Monitor/assess lab values and report abnormal values   Monitor for muscle weakness   Assess and reassess fluid and electrolyte status   Observe for cardiac arrhythmias  Goal: Adequate hydration  Outcome: Progressing  Flowsheets (Taken 08/02/2022 1843 by Delice Bison, RN)  Adequate hydration:   Monitor and assess vital signs and perfusion   Assess for peripheral, sacral, periorbital and abdominal edema   Assess mucus membranes, skin color, turgor, perfusion and presence of edema     Problem: Hemodynamic Status: Cardiac  Goal: Stable vital signs and fluid balance  Outcome: Progressing  Flowsheets (Taken 07/26/2022 2107 by Elodia Florence, RN)  Stable vital signs and fluid balance:   Assess signs and symptoms associated with cardiac rhythm changes   Monitor lab values     Problem: Bladder/Voiding  Goal: Patient will experience proper bladder emptying during admission and remain free from infection  Outcome: Progressing     Problem: Diabetes: Glucose Imbalance  Goal: Blood glucose stable at established goal  Outcome: Progressing  Flowsheets (Taken 08/03/2022 2342 by Elodia Florence, RN)  Blood glucose stable at established goal:   Monitor lab values   Assess for hypoglycemia /hyperglycemia   Ensure patient/family has adequate teaching materials

## 2022-08-04 NOTE — Progress Notes (Signed)
Nutritional Support Services  Nutrition Follow-up    Meredith Baker 76 y.o. female   MRN: [id number]    Summary of Nutrition Recommendations:    Continue CCHO diet.    Continue Ensure Plus HP 1 bottle PO TID to optimize nutritional intake in the setting of malnutrition.   Each Ensure Plus High Protein provides 350 kcal and 20 gm protein.    Daily weights.     Consider adjusting sliding scale d/t high BG. Target BG range: 140-180 mg/dl    -----------------------------------------------------------------------------------------------------------------  D/w RN                                                       ASSESSMENT DATA     Subjective Nutrition: Patient seen at bedside, awake. Pt reports appetite is fine. Pt states she is eating 3 meals/day, ~50% of each meal (c/w charted intake). Pt reports drinking Ensure TID (likely providing ~70% of daily needs). Observed unopened Ensure at bedside and one 100% consumed in the pt's hand. Pt states she likes the mashed potatoes because they are soft, however denies chew/swallow difficulties. Pt denies n/v/c/d. Pt requests to continue current ONS frequency.     Learning Needs: No educational needs at this time.     Events of Current Admission: 76 years old female with history of hypertension, type 2 diabetes, hyperlipidemia, osteoarthritis, bedbound, history of bilateral DVT who presented for evaluation of generalized weakness presented for weakness and inability to care for herself admitted with a diagnosis of UTI and NSTEMI cardiology on board, Noted trended down H/H.      Medical Hx:  has a past medical history of Diabetes mellitus, History of blood clots, and Hypertension.     Orders Placed This Encounter   Procedures    Diet consistent carbohydrate    Ensure Plus High Protein Supplement Quantity: A. One; Flavor: Vanilla; Frequency: TID (3 times a day) with meals     Intake:  12/5: 25% x1, 100% Ensure x1  12/4: 50% x1, 75% x1, 100% Ensure x2    ANTHROPOMETRIC  Height:  157.5 cm ('5\' 2"'$ )  Weight: 107.7 kg (237 lb 7 oz)  Weight Change: -2.8  Body mass index is 43.43 kg/m.      Weight Monitoring     Weight Weight Method   07/23/2022 100.336 kg  Bed Scale    07/24/2022 102.9 kg  Bed Scale     102.9 kg  Bed Scale     103.103 kg     07/27/2022 105.7 kg  Bed Scale    07/28/2022 109.589 kg  Bed Scale    07/29/2022 110 kg  Bed Scale    07/30/2022 106 kg  Bed Scale    07/31/2022 102 kg  Bed Scale    08/01/2022 104.7 kg  Bed Scale    08/02/2022 107 kg  Bed Scale    08/03/2022 110.8 kg  Bed Scale    08/04/2022 107.7 kg  Bed Scale      Weight History Summary: Variable weights, potential gain during admission.    ESTIMATED NEEDS    Total Daily Energy Needs: 1546.5 to 2062 kcal  Method for Calculating Energy Needs: 15 kcal - 20 kcal per kg  at 103.1 kg (Actual body weight)  Rationale: obese, noncritical     Total Daily Protein Needs: 78.3 to  104.4 g  Method for Calculating Protein Needs: 1.5 g - 2 g per kg at 52.2 kg (Ideal body weight)  Rationale: obese, noncritical    Total Daily Fluid Needs: 1148.4 to 1409.4 ml  Method for Calculating Fluid Needs: 22 ml - 27 ml  per kg at 52.2 kg (Ideal body weight)  Rationale: bmi, age, or per md    Pertinent Medications: lipitor,lantus, SSI, protonix, miralax, pericolace    Pertinent labs:  Recent Labs   Lab 08/03/22  0422 08/01/22  0312 07/30/22  0324 07/29/22  0254   Sodium  --   --   --  135   Potassium  --   --   --  4.4   Chloride  --   --   --  104   CO2  --   --   --  26   BUN  --   --   --  13.0   Creatinine  --   --   --  0.7   Glucose  --   --   --  284*   Calcium  --   --   --  8.7   eGFR  --   --   --  >60.0   WBC 8.89 9.96* 9.76* 8.64   Hematocrit 26.0* 28.2* 30.3* 26.8*   Hgb 8.2* 8.8* 9.3* 8.5*                                                        MONITORING/EVALUATION     Goals:     1. Patient will consume >/=75% of nutritional needs via meals by next RD follow up - active     Nutrition Risk Level: Moderate (will follow up at least 1 time per week  and PRN)   Downgraded d/t increased PO intake.     [place] [facility]  [phone]

## 2022-08-04 NOTE — Discharge Summary (Signed)
[facility]      Patient: Meredith Baker  Admission Date: 07/23/2022   DOB: May 20, 1946  Discharge Date: 08/04/2022    MRN: [id number]  Discharge Attending: Vernona Rieger, MD     Referring Physician: Pcp, None, MD  PCP: Pcp, None, MD       DISCHARGE SUMMARY     Discharge Information   Admission Diagnosis:   NSTEMI (non-ST elevated myocardial infarction)    Discharge Diagnosis:   Active Hospital Problems    Diagnosis    NSTEMI (non-ST elevated myocardial infarction)        Admission Condition: Guarded  Discharge Condition: Stable and improved  Consultants: Cardiology/pathology and gastroenterology  Functional Status: Bedbound  Discharged to: SNF  08-03-22: Multidisciplinary conversation involving myself, case management, nurse, patient, we spoke on the speakerphone with the contact at the nursing home to try to address any deficiencies in the patient's application so that her skilled nursing facility placement could be facilitated.  Greatly appreciate case management's assistance  08-04-22: Patient with intermittent delirium.  Thought that she went to a party last night.  Remains pleasant.  Attempted to redirect.  Still working on [facility] Course   Presentation History   76 years old female with history of hypertension, type 2 diabetes, hyperlipidemia, osteoarthritis, bedbound, history of bilateral DVT who presented for evaluation of generalized weakness presented for weakness and inability to care for herself admitted with a diagnosis of UTI and NSTEMI cardiology on board, Noted trended down H/H.    See HPI for details.    Hospital Course (11 Days)     Anemia  Hemoglobin has been dropping from  From 12-10--8.2  I have ordered H&H monitor every 6 hours  Fecal occult blood test  Given patient has history of DVT I will continue the anticoagulation for now  Fecal occult blood test  Will monitor closely  No obvious bleeding noted  11/29: So far H&H stable between 8-9, continue heparin drip, pending  hematology evaluation) final recommendation, fecal occult blood test pending, patient had 1 bowel movement yesterday which is brown, no obvious bleeding noted  11/30: H&H stayed stable, patient switched back to Pradaxa per heme-onc recommendation, fecal occult blood test came positive, GI consulted to weigh in, started on PPI  12/1-12/5: Discussed with GI, recommended outpatient EGD colonoscopy and Protonix 40 mg twice daily for a month followed by daily, H&H remained stable in fact uptrending plan communicated with the patient as well as niece    Sepsis likely from UTI  Presented with leukocytosis and tachycardia  CT scan of the chest showed small left pleural effusion and evidence of pulmonary hypertension  UA consistent with UTI  Blood culture and urine culture came negative  Initially started on empirical broad-spectrum antibiotic vancomycin and Zosyn  MRSA test is negative  CT abdomen showed nonobstructive stone and small bladder stone  Antibiotic de-escalated to ceftriaxone  Leukocytosis improved  11/29-11/30: Patient will complete 5 days course of antibiotics today        History of DVT  Patient had bilateral lower extremity DVT back in September 2023 currently on Pradaxa  Repeat venous ultrasound showed left femoral popliteal DVT extending to the left peroneal vein and posterior tibialis vein with associated mild soft tissue swelling/edema and short segment nonocclusive thrombus within the right common femoral vein  I will involve hematology to guide on treatment  11/29: CTA ruled out PE, for now we will continue heparin drip, will switch her  back to [place] once hematology clears  11/30: Patient is a switch to Pradaxa after hematology cleared the patient  12/1-12/5: Patient tolerated Pradaxa, outpatient follow-up with hematology    NSTEMI  Elevated troponin  Likely demand ischemia  Troponin trended up from 90-108  Patient is on aspirin, statin and Pradaxa  Cardiology on board, recommended to get CTA  Follow-up  recommendation  11/29-12/5: 2D echo showed normal EF, no LV wall motion abnormality, elevated troponin is likely from demand ischemia and anemia, continue aspirin/statin/Coreg/losartan.  Outpatient follow-up with cardiology        Chronic stable medical condition  Hypertension: Continue amlodipine/metoprolol/losartan  11/29-12/5: Blood pressure within acceptable range, outpatient follow-up with PCP     Type 2 diabetes  Carb controlled diet  Sliding scale  Hypoglycemia protocol  12/1-12/5: Increase Lantus to 10, continue sliding scale, and carb controlled diet.  12/5: Blood sugar low 200s, I have increased the Lantus to 12, continue to monitor POC    Morbid obesity  Outpatient follow-up     Bedbound  Wound on her left iliac area  Wound nurse consult       Procedures/Imaging:   Upon my review: Negative except discussed above         Progress Note/Physical Exam at Discharge     Subjective:Patient is stable for discharge.    Vitals:    08/04/22 1235 08/04/22 1632 08/04/22 1916 08/04/22 1959   BP: 121/73 148/62 113/68 113/68   Pulse: 84 90 95 95   Resp: '16 16 14    '$ Temp: 97.9 F (36.6 C) 98.2 F (36.8 C) 98.6 F (37 C)    TempSrc: Oral Oral Oral    SpO2: 99% 97% 98%    Weight:       Height:               General: Chronically sick looking, NAD, AAOx3  HEENT: Sclera anicteric, no conjunctival injection, OP: Clear, MMM  Neck: Supple, FROM, no LAD  Cardiovascular: RRR, no m/r/g  Lungs: CTAB, no w/r/r  Abdomen: Soft, +BS, NT/ND, no masses, no g/r  Extremities: No C/C/E  Skin: No rashes or lesions noted  Neuro: Weak all over but able to follow commands and move all extremities       Diagnostics     Labs/Studies Pending at Discharge: Yes outpatient EGD and colonoscopy    Last Labs   Recent Labs   Lab 08/03/22  0422 08/01/22  0312 07/30/22  0324   WBC 8.89 9.96* 9.76*   RBC 2.57* 2.78* 3.04*   Hgb 8.2* 8.8* 9.3*   Hematocrit 26.0* 28.2* 30.3*   MCV 101.2* 101.4* 99.7*   Platelets 442* 550* 495*         Recent Labs   Lab  08/04/22  1036 07/29/22  0254   Sodium 141 135   Potassium 4.4 4.4   Chloride 104 104   CO2 29 26   BUN 19.0 13.0   Creatinine 0.7 0.7   Glucose 191* 284*   Calcium 9.1 8.7         Microbiology Results (last 15 days)       Procedure Component Value Units Date/Time    MRSA culture [id number] Collected: 07/24/22 1932    Order Status: Completed Specimen: Culturette from Nasal Swab Updated: 07/26/22 0214     Culture MRSA Surveillance Negative for Methicillin Resistant Staph aureus    MRSA culture [id number] Collected: 07/24/22 1932    Order Status: Completed Specimen: Culturette from Throat  Updated: 07/26/22 0214     Culture MRSA Surveillance Negative for Methicillin Resistant Staph aureus    Urine culture [id number] Collected: 07/24/22 1932    Order Status: Completed Specimen: Bladder Updated: 07/26/22 0413    Narrative:      ORDER#: [id number]                                    ORDERED BY: Enis Gash  SOURCE: Urine                                        COLLECTED:  07/24/22 19:32  ANTIBIOTICS AT COLL.:                                RECEIVED :  07/24/22 20:10  Culture Urine                              FINAL       07/26/22 04:13  07/26/22   No growth of >1,000 CFU/ML, No further work      Culture Blood Aerobic and Anaerobic [id number] Collected: 07/23/22 2218    Order Status: Completed Specimen: Blood, Venipuncture Updated: 07/29/22 0421    Narrative:      The order will result in two separate 8-33m bottles  Please do NOT order repeat blood cultures if one has been  drawn within the last 48 hours  UNLESS concerned for  endocarditis  AVOID BLOOD CULTURE DRAWS FROM CENTRAL LINE IF POSSIBLE  Indications:->Sepsis  ORDER#: [id number]                                   ORDERED BY: BEnis Gash SOURCE: Blood, Venipuncture                          COLLECTED:  07/23/22 22:18  ANTIBIOTICS AT COLL.:                                RECEIVED :  07/24/22 01:42  Culture Blood Aerobic and Anaerobic        FINAL        07/29/22 04:21  07/29/22   No growth after 5 days of incubation.      Culture Blood Aerobic and Anaerobic [[id number]: 07/23/22 2218    Order Status: Completed Specimen: Blood, Venipuncture Updated: 07/29/22 0421    Narrative:      The order will result in two separate 8-13mbottles  Please do NOT order repeat blood cultures if one has been  drawn within the last 48 hours  UNLESS concerned for  endocarditis  AVOID BLOOD CULTURE DRAWS FROM CENTRAL LINE IF POSSIBLE  Indications:->Sepsis  ORDER#: [id number]                                  ORDERED BY: BREnis GashSOURCE: Blood, Venipuncture  COLLECTED:  07/23/22 22:18  ANTIBIOTICS AT COLL.:                                RECEIVED :  07/24/22 01:42  Culture Blood Aerobic and Anaerobic        FINAL       07/29/22 04:21  07/29/22   No growth after 5 days of incubation.      COVID-19 (SARS-CoV-2) and Influenza A/B, NAA (Liat Rapid) [id number] Collected: 07/23/22 2218    Order Status: Completed Specimen: Culturette from Nasopharyngeal Updated: 07/23/22 2259     Purpose of COVID testing Diagnostic -[facility]     SARS-CoV-2 Specimen Source Nasal Swab     SARS CoV 2 Overall Result Not Detected     Comment: __________________________________________________  -A result of "Detected" indicates POSITIVE for the    presence of SARS CoV-2 RNA  -A result of "Not Detected" indicates NEGATIVE for the    presence of SARS CoV-2 RNA  __________________________________________________________  Test performed using the Roche cobas Liat SARS-CoV-2 assay. This assay is  only for use under the Food and Drug Administration's Emergency Use  Authorization. This is a real-time RT-PCR assay for the qualitative  detection of SARS-CoV-2 RNA. Viral nucleic acids may persist in vivo,  independent of viability. Detection of viral nucleic acid does not imply the  presence of infectious virus, or that virus nucleic acid is the cause of  clinical symptoms. Negative  results do not preclude SARS-CoV-2 infection and  should not be used as the sole basis for diagnosis, treatment or other  patient management decisions. Negative results must be combined with  clinical observations, patient history, and/or epidemiological information.  Invalid results may be due to inhibiting substances in the specimen and  recollection should occur. Please see Fact Sheets for patients and providers  located:  https://www.benson-chung.com/          Influenza A Not Detected     Influenza B Not Detected     Comment: Test performed using the Roche cobas Liat SARS-CoV-2 & Influenza A/B assay.  This assay is only for use under the Food and Drug Administration's  Emergency Use Authorization. This is a multiplex real-time RT-PCR assay  intended for the simultaneous in vitro qualitative detection and  differentiation of SARS-CoV-2, influenza A, and influenza B virus RNA. Viral  nucleic acids may persist in vivo, independent of viability. Detection of  viral nucleic acid does not imply the presence of infectious virus, or that  virus nucleic acid is the cause of clinical symptoms. Negative results do  not preclude SARS-CoV-2, influenza A, and/or influenza B infection and  should not be used as the sole basis for diagnosis, treatment or other  patient management decisions. Negative results must be combined with  clinical observations, patient history, and/or epidemiological information.  Invalid results may be due to inhibiting substances in the specimen and  recollection should occur. Please see Fact Sheets for patients and providers  located: http://olson-hall.info/.         Narrative:      o Collect and clearly label specimen type:  o PREFERRED-Upper respiratory specimen: One Nasal Swab in  Transport Media.  o Hand deliver to laboratory ASAP  Diagnostic -[facility]             Patient Instructions   Discharge Diet: Heart healthy diet  Discharge Activity: As tolerated    Follow Up  Appointment:  Follow-up Information       West Carbo, MD Follow up in 1 week(s).    Specialties: Gastroenterology, Internal Medicine  Contact information:  [place] [address]  [phone]               Tia Alert, MD Follow up in 1 week(s).    Specialties: Medical Oncology, Hematology  Contact information:  [address]  [place] [address]  [phone]               Vonzell Schlatter, MD Follow up in 1 week(s).    Specialties: Interventional Cardiology, Internal Medicine, Cardiology  Contact information:  [place]  [place] [address]  [phone]               Pcp, None, MD .                              Discharge Medications:     Medication List        START taking these medications      aspirin 81 MG chewable tablet  Chew 1 tablet (81 mg) by mouth daily     atorvastatin 40 MG tablet  Commonly known as: LIPITOR  Take 1 tablet (40 mg) by mouth nightly     carboxymethylcellulose 0.5 % ophthalmic solution  Commonly known as: REFRESH TEARS  Place 1 drop into both eyes 3 (three) times daily as needed (dry eye)     insulin glargine 100 UNIT/ML injection  Commonly known as: LANTUS  Inject 12 Units into the skin nightly     pantoprazole 40 MG tablet  Commonly known as: PROTONIX  Take 1 tablet (40 mg) by mouth 2 (two) times daily for 30 days, THEN 1 tablet (40 mg) daily.  Start taking on: July 30, 2022            CONTINUE taking these medications      acetaminophen 500 MG tablet  Commonly known as: TYLENOL     CENTRUM SILVER 50+WOMEN PO     dabigatran 150 MG Caps  Commonly known as: PRADAXA     losartan 100 MG TABS, hydroCHLOROthiazide 25 MG TABS     metoprolol succinate 200 MG 24 hr tablet  Commonly known as: TOPROL-XL     TURMERIC PO     valACYclovir 1000 MG tablet  Commonly known as: VALTREX     vitamin D (ergocalciferol) 50000 UNIT Caps  Commonly known as: DRISDOL            STOP taking these medications      amLODIPine 5 MG tablet  Commonly known  as: NORVASC     HUMULIN 70/30 KWIKPEN SC               Where to Get Your Medications        These medications were sent to [place], [place] - [address]  [address], [place] [address]      Phone: [phone]   aspirin 81 MG chewable tablet  atorvastatin 40 MG tablet  pantoprazole 40 MG tablet       Information about where to get these medications is not yet available    Ask your nurse or doctor about these medications  carboxymethylcellulose 0.5 % ophthalmic solution  insulin glargine 100 UNIT/ML injection  Time spent examining patient, discussing with patient/family regarding hospital course, chart review, reconciling medications and discharge planning: 31 minutes.    Signed,  Danelle Earthly, MD    8:27 PM 08/04/2022

## 2022-08-04 NOTE — Progress Notes (Addendum)
11:00am update    This covering SW appreciated hand off from [place], [place] that [place] at [facility] would likely have a decision by 3:00pm today.    This SW left 2 VM messages with [place] in the business office at [facility] to notify that this Probation officer is covering CM/ SW today    [place]           Shela Commons  PH# [phone]        1:40pm Update    SW received TC from [place]  in the Business Office at [place] facilitated the pt- Mallery Warstler speaking with Audry Pili reports business office needs checking account balance and pt gave permission for [place] to contact pt's bank and include her on conference call.     Pt's bank was reached on the conference call and financial information discussed     Update:    [place] ( [phone] )  pt's niece aware financial office at SNF still needing additional information for placement     Pt /family aware that pt's December check will be required to go towards the cost of [facility] placement.    Updated CM leadership and [place], CM of the above            Jamse Belfast ,MSW  [phone]

## 2022-08-05 LAB — CBC
Absolute NRBC: 0 10*3/uL (ref 0.00–0.00)
Hematocrit: 25.6 % — ABNORMAL LOW (ref 34.7–43.7)
Hgb: 8.3 g/dL — ABNORMAL LOW (ref 11.4–14.8)
MCH: 32.4 pg (ref 25.1–33.5)
MCHC: 32.4 g/dL (ref 31.5–35.8)
MCV: 100 fL — ABNORMAL HIGH (ref 78.0–96.0)
MPV: 9.8 fL (ref 8.9–12.5)
Nucleated RBC: 0 /100 WBC (ref 0.0–0.0)
Platelets: 419 10*3/uL — ABNORMAL HIGH (ref 142–346)
RBC: 2.56 10*6/uL — ABNORMAL LOW (ref 3.90–5.10)
RDW: 19 % — ABNORMAL HIGH (ref 11–15)
WBC: 9.67 10*3/uL — ABNORMAL HIGH (ref 3.10–9.50)

## 2022-08-05 LAB — GLUCOSE WHOLE BLOOD - POCT
Whole Blood Glucose POCT: 183 mg/dL — ABNORMAL HIGH (ref 70–100)
Whole Blood Glucose POCT: 321 mg/dL — ABNORMAL HIGH (ref 70–100)
Whole Blood Glucose POCT: 229 mg/dL — ABNORMAL HIGH (ref 70–100)
Whole Blood Glucose POCT: 129 mg/dL — ABNORMAL HIGH (ref 70–100)

## 2022-08-05 NOTE — Plan of Care (Addendum)
NURSING SHIFT NOTE     Patient: Meredith Baker  Day: 12      SHIFT EVENTS     Shift Narrative/Significant Events (PRN med administration, fall, RRT, etc.):     Assumed pt responsibility, axo x3-4 occasionally disoriented to time and situation, pt on room air, no report of pain from pt, assisted pt with eating and comfort measures provided to pt, scheudled medications given as ordered, pt vs stable, no changes to report on pts condition.   Addendum: pt awoke complaining of very swollen and stiff feet and joint pain in bilateral lower extremities. Removed SCDs and socks to give pt a break from scds. Vs stable, pt stable, no changes to report    Safety and fall precautions remain in place. Purposeful rounding completed.          ASSESSMENT     Changes in assessment from patient's baseline this shift:    Neuro: No  CV: No  Pulm: No  Peripheral Vascular: No  HEENT: No  GI: No  BM during shift: No, Last BM: Last BM Date: 08/05/22  GU: No   Integ: No  MS: No    Pain: None  Pain Interventions: tylenol   Medications Utilized:     Mobility: PMP Activity: Step 3 - Bed Mobility of Distance Walked (ft) (Step 6,7): 0 Feet           Lines     Patient Lines/Drains/Airways Status       Active Lines, Drains and Airways       Name Placement date Placement time Site Days    Peripheral IV 07/24/22 20 G Anterior;Left Forearm 07/24/22  0030  Forearm  12    External Urinary Catheter 07/23/22  1602  --  13                         VITAL SIGNS     Vitals:    08/05/22 2318   BP: 104/61   Pulse: 94   Resp: 18   Temp: 98.6 F (37 C)   SpO2: 98%       Temp  Min: 97.9 F (36.6 C)  Max: 99.3 F (37.4 C)  Pulse  Min: 85  Max: 97  Resp  Min: 18  Max: 20  BP  Min: 93/59  Max: 143/73  SpO2  Min: 98 %  Max: 100 %    No intake or output data in the 24 hours ending 08/05/22 2333           CARE PLAN        Problem: Pain interferes with ability to perform ADL  Goal: Pain at adequate level as identified by patient  Outcome: Progressing  Flowsheets (Taken  08/02/2022 1843 by Delice Bison, RN)  Pain at adequate level as identified by patient:   Identify patient comfort function goal   Assess pain on admission, during daily assessment and/or before any "as needed" intervention(s)   Evaluate if patient comfort function goal is met   Evaluate patient's satisfaction with pain management progress   Offer non-pharmacological pain management interventions   Include patient/patient care companion in decisions related to pain management as needed     Problem: Side Effects from Pain Analgesia  Goal: Patient will experience minimal side effects of analgesic therapy  Outcome: Progressing  Flowsheets (Taken 08/02/2022 0534 by Derrill Memo, RN)  Patient will experience minimal side effects of analgesic therapy: Monitor/assess patient's respiratory status (RR  depth, effort, breath sounds)     Problem: Moderate/High Fall Risk Score >5  Goal: Patient will remain free of falls  Outcome: Progressing  Flowsheets  Taken 08/05/2022 2200 by Evalina Field, RN  High (Greater than 13):   HIGH-Consider use of low bed   HIGH-Initiate use of floor mats as appropriate   HIGH-Apply yellow "Fall Risk" arm band   HIGH-Utilize chair pad alarm for patient while in the chair   HIGH-Bed alarm on at all times while patient in bed   HIGH-Visual cue at entrance to patient's room  Taken 08/02/2022 1600 by Delice Bison, RN  Moderate Risk (6-13):   MOD- Consider video monitoring   MOD-Use gait belt when appropriate   MOD-Perform dangle, stand, walk (DSW) prior to mobilization   MOD-Utilize diversion activities   MOD-Re-orient confused patients   MOD-Floor mat at bedside (where available) if appropriate   MOD-Apply bed exit alarm if patient is confused  Taken 07/29/2022 1207 by Mechele Dawley, RN  VH High Risk (Greater than 13):   ALL REQUIRED LOW INTERVENTIONS   ALL REQUIRED MODERATE INTERVENTIONS   BED ALARM WILL BE ACTIVATED WHEN THE PATEINT IS IN BED WITH SIGNAGE "RESET BED ALARM"   Include  family/significant other in multidisciplinary discussion regarding plan of care as appropriate   Request PT/OT therapy consult order from physician for patients with gait/mobility impairment   Use of floor mat     Problem: Compromised Tissue integrity  Goal: Damaged tissue is healing and protected  Outcome: Progressing  Flowsheets (Taken 08/04/2022 2000 by Elodia Florence, RN)  Damaged tissue is healing and protected: Monitor/assess Braden scale every shift  Goal: Nutritional status is improving  Outcome: Progressing  Flowsheets (Taken 08/02/2022 2020 by Minerva Fester, RN)  Nutritional status is improving:   Assist patient with eating   Allow adequate time for meals     Problem: Safety  Goal: Patient will be free from injury during hospitalization  Outcome: Progressing  Flowsheets (Taken 08/03/2022 2342 by Elodia Florence, RN)  Patient will be free from injury during hospitalization:   Assess patient's risk for falls and implement fall prevention plan of care per policy   Provide and maintain safe environment   Hourly rounding  Goal: Patient will be free from infection during hospitalization  Outcome: Progressing  Flowsheets (Taken 08/01/2022 1833 by Delice Bison, RN)  Free from Infection during hospitalization:   Assess and monitor for signs and symptoms of infection   Monitor lab/diagnostic results   Encourage patient and family to use good hand hygiene technique     Problem: Fluid and Electrolyte Imbalance/ Endocrine  Goal: Fluid and electrolyte balance are achieved/maintained  Outcome: Progressing  Flowsheets (Taken 08/01/2022 1833 by Delice Bison, RN)  Fluid and electrolyte balance are achieved/maintained:   Monitor/assess lab values and report abnormal values   Monitor for muscle weakness   Assess and reassess fluid and electrolyte status   Observe for cardiac arrhythmias  Goal: Adequate hydration  Outcome: Progressing  Flowsheets (Taken 08/02/2022 1843 by Delice Bison, RN)  Adequate  hydration:   Monitor and assess vital signs and perfusion   Assess for peripheral, sacral, periorbital and abdominal edema   Assess mucus membranes, skin color, turgor, perfusion and presence of edema     Problem: Hemodynamic Status: Cardiac  Goal: Stable vital signs and fluid balance  Outcome: Progressing  Flowsheets (Taken 07/26/2022 2107 by Elodia Florence, RN)  Stable vital signs and fluid balance:   Assess  signs and symptoms associated with cardiac rhythm changes   Monitor lab values     Problem: Bladder/Voiding  Goal: Patient will experience proper bladder emptying during admission and remain free from infection  Outcome: Progressing     Problem: Diabetes: Glucose Imbalance  Goal: Blood glucose stable at established goal  Outcome: Progressing  Flowsheets (Taken 08/03/2022 2342 by Elodia Florence, RN)  Blood glucose stable at established goal:   Monitor lab values   Assess for hypoglycemia /hyperglycemia   Ensure patient/family has adequate teaching materials

## 2022-08-05 NOTE — Plan of Care (Signed)
Patient received conscious, oriented, cooperative.  Has spontaneous eye opening, moves freely in bed, obeys command.  On room air, stable vital signs.  No other complain  Problem: Pain interferes with ability to perform ADL  Goal: Pain at adequate level as identified by patient  Outcome: Progressing  Flowsheets (Taken 08/02/2022 1843 by Delice Bison, RN)  Pain at adequate level as identified by patient:   Identify patient comfort function goal   Assess pain on admission, during daily assessment and/or before any "as needed" intervention(s)   Evaluate if patient comfort function goal is met   Evaluate patient's satisfaction with pain management progress   Offer non-pharmacological pain management interventions   Include patient/patient care companion in decisions related to pain management as needed     Problem: Side Effects from Pain Analgesia  Goal: Patient will experience minimal side effects of analgesic therapy  Outcome: Progressing  Flowsheets (Taken 08/02/2022 0534 by Derrill Memo, RN)  Patient will experience minimal side effects of analgesic therapy: Monitor/assess patient's respiratory status (RR depth, effort, breath sounds)     Problem: Moderate/High Fall Risk Score >5  Goal: Patient will remain free of falls  Outcome: Progressing  Flowsheets  Taken 08/04/2022 2000 by Elodia Florence, RN  High (Greater than 13):   HIGH-Bed alarm on at all times while patient in bed   HIGH-Visual cue at entrance to patient's room   HIGH-Apply yellow "Fall Risk" arm band   HIGH-Consider use of low bed  Taken 08/02/2022 1600 by Delice Bison, RN  Moderate Risk (6-13):   MOD- Consider video monitoring   MOD-Use gait belt when appropriate   MOD-Perform dangle, stand, walk (DSW) prior to mobilization   MOD-Utilize diversion activities   MOD-Re-orient confused patients   MOD-Floor mat at bedside (where available) if appropriate   MOD-Apply bed exit alarm if patient is confused  Taken 07/29/2022 1207 by Mechele Dawley, RN  VH High Risk (Greater than 13):   ALL REQUIRED LOW INTERVENTIONS   ALL REQUIRED MODERATE INTERVENTIONS   BED ALARM WILL BE ACTIVATED WHEN THE PATEINT IS IN BED WITH SIGNAGE "RESET BED ALARM"   Include family/significant other in multidisciplinary discussion regarding plan of care as appropriate   Request PT/OT therapy consult order from physician for patients with gait/mobility impairment   Use of floor mat     Problem: Compromised Tissue integrity  Goal: Damaged tissue is healing and protected  Outcome: Progressing  Flowsheets (Taken 08/04/2022 2000 by Elodia Florence, RN)  Damaged tissue is healing and protected: Monitor/assess Braden scale every shift  Goal: Nutritional status is improving  Outcome: Progressing  Flowsheets (Taken 08/02/2022 2020 by Minerva Fester, RN)  Nutritional status is improving:   Assist patient with eating   Allow adequate time for meals     Problem: Safety  Goal: Patient will be free from injury during hospitalization  Outcome: Progressing  Flowsheets (Taken 08/03/2022 2342 by Elodia Florence, RN)  Patient will be free from injury during hospitalization:   Assess patient's risk for falls and implement fall prevention plan of care per policy   Provide and maintain safe environment   Hourly rounding  Goal: Patient will be free from infection during hospitalization  Outcome: Progressing  Flowsheets (Taken 08/01/2022 1833 by Delice Bison, RN)  Free from Infection during hospitalization:   Assess and monitor for signs and symptoms of infection   Monitor lab/diagnostic results   Encourage patient and family to use good hand hygiene technique  Problem: Fluid and Electrolyte Imbalance/ Endocrine  Goal: Fluid and electrolyte balance are achieved/maintained  Outcome: Progressing  Flowsheets (Taken 08/01/2022 1833 by Delice Bison, RN)  Fluid and electrolyte balance are achieved/maintained:   Monitor/assess lab values and report abnormal values   Monitor for muscle  weakness   Assess and reassess fluid and electrolyte status   Observe for cardiac arrhythmias  Goal: Adequate hydration  Outcome: Progressing  Flowsheets (Taken 08/02/2022 1843 by Delice Bison, RN)  Adequate hydration:   Monitor and assess vital signs and perfusion   Assess for peripheral, sacral, periorbital and abdominal edema   Assess mucus membranes, skin color, turgor, perfusion and presence of edema     Problem: Hemodynamic Status: Cardiac  Goal: Stable vital signs and fluid balance  Outcome: Progressing  Flowsheets (Taken 07/26/2022 2107 by Elodia Florence, RN)  Stable vital signs and fluid balance:   Assess signs and symptoms associated with cardiac rhythm changes   Monitor lab values     Problem: Bladder/Voiding  Goal: Patient will experience proper bladder emptying during admission and remain free from infection  Outcome: Progressing     Problem: Diabetes: Glucose Imbalance  Goal: Blood glucose stable at established goal  Outcome: Progressing  Flowsheets (Taken 08/03/2022 2342 by Elodia Florence, RN)  Blood glucose stable at established goal:   Monitor lab values   Assess for hypoglycemia /hyperglycemia   Ensure patient/family has adequate teaching materials

## 2022-08-05 NOTE — OT Progress Note (Signed)
Occupational Therapy Treatment  [place] Acute [facility] Therapy Recommendations   Discharge Recommendations:  SNF    DME needs IF patient is discharging home: No additional equipment/DME recommended at this time    Therapy discharge recommendations may change with patient status.  Please refer to most recent note for up-to-date recommendations.    Unit: [facility]  Bed: A2502/A2502-B    ___________________________________________________    Time of treatment:  OT Received On: 08/05/22  Start Time: 1300  Stop Time: 1325  Time Calculation (min): 25 min       Chart Review and Collaboration with Care Team: 5 minutes, not included in above time.    OT Visit Number: 5      Precautions and Contraindications:    Precautions  Weight Bearing Status: no restrictions  Other Precautions: Bleeding & Sores    Personal Protective Equipment (PPE)  gloves and procedure mask    Updated Labs:  Lab Results   Component Value Date/Time    HGB 8.3 (L) 08/05/2022 02:53 AM    HCT 25.6 (L) 08/05/2022 02:53 AM    K 4.4 08/04/2022 10:36 AM    NA 141 08/04/2022 10:36 AM    INR 1.6 (H) 07/24/2022 01:17 AM    TROPI 103.4 (AA) 07/24/2022 06:38 AM    TROPI 108.5 (AA) 07/24/2022 12:26 AM    TROPI calc n/a 07/24/2022 12:26 AM    TROPI 92.0 (AA) 07/23/2022 04:48 PM       All imaging reviewed, please see chart for details.    Subjective: "I might go today."          Patient's medical condition is appropriate for Occupational Therapy intervention at this time.  Patient is agreeable to participation in the therapy session. Nursing clears patient for therapy.  Pain Assessment  Pain Assessment: No/denies pain  Pain Score: 8-severe pain  Pain Location: Knee  Pain Orientation: Left  Pain Intervention(s): Repositioned;Emotional support      Objective:  Observation of Patient/Vital Signs:  Stable      Cognition/Neuro Status  Arousal/Alertness: Appropriate responses to stimuli  Attention Span: Appears intact  Orientation Level:  Oriented X4  Memory: Appears intact  Following Commands: Follows all commands and directions without difficulty  Insights: Fully aware of deficits;Educated in safety awareness  Problem Solving: Able to problem solve independently  Behavior: calm;cooperative;attentive    Functional Mobility  Rolling: Moderate Assist;to left  Scooting Transfers: Minimal Assist;additional time (to [facility] using overhead grab bar)  Supine to Sit Transfers: Maximal Assist;additional time (Pt unable to comnplete supine to sit due to pain in the L knee and increased posterior leaning.)    Self-care and Home Management  Eating: Independent;in bed  UB Dressing: Moderate Assist;in bed        Educated the Patient to role of occupational therapy, plan of care, goals of therapy and safety with mobility and ADLs, energy conservation techniques with verbalized understanding  and demonstrated understanding.     Patient left in bed with alarm and all other medical equipment in place and call bell and all personal items/needs within reach.  RN notified of session outcome.         Assessment: Pt is progressing slowly towards OT goals due to increased R knee pain, decreased activity tolerance, weakness and fatigue. Pt would benefit from continued OT services in order to maximize potential. Therapist continues to recommend [place] to SNF  PMP Activity: Step 3 - Bed Mobility  Distance Walked (ft) (Step 6,7): 0 Feet      Plan:  OT Frequency Recommended: 2-3x/wk  Goal Formulation: Patient      Time For Goal Achievement: by time of discharge  ADL Goals  Patient will feed self: Independent, Goal met  Patient will groom self: Independent, Not met  Mobility and Transfer Goals  Other Goal: Patient will transitioned supine <> EOB c Min A; not met                       Continue plan of care.      Theresia Lo, COTA/L    Physical Medicine and [facility]  [phone]    08/05/2022  1:41 PM    [facility]  Patient:  Meredith Baker MRN#: [id number]   Unit: [facility] [facility] Bed: A2502/A2502-B

## 2022-08-05 NOTE — Progress Notes (Addendum)
TC to [place] to follow-up with the patient's status for financial approval for admission into [facility]. [place] stated that she reached out to her business department with no response on the patient's approval. [place] stated that she is unsure what other information is needed by the business department because the they not responded to her messages at all today. CM will have to follow-up with [place] and the business department on Monday.     CM met with Miss Meredith Baker. Patient was not able to participate with the conversation and seemed more confused then her normal. RN notified. RN stated the patient had Gabapentin a few hours ago. She is more lucid in the morning. CM to follow-up at a later time.     Dorcas Mcmurray RN, BSN  Case Manager I  [phone]  [email or web address]

## 2022-08-05 NOTE — Discharge Summary (Addendum)
[facility]      Patient: Meredith Baker  Admission Date: 07/23/2022   DOB: 1945-09-02  Discharge Date: 08/05/2022    MRN: [id number]  Discharge Attending: Vernona Rieger, MD     Referring Physician: Pcp, None, MD  PCP: Pcp, None, MD       DISCHARGE SUMMARY     Discharge Information   Admission Diagnosis:   NSTEMI (non-ST elevated myocardial infarction)    Discharge Diagnosis:   Active Hospital Problems    Diagnosis    NSTEMI (non-ST elevated myocardial infarction)        Admission Condition: Guarded  Discharge Condition: Stable and improved  Consultants: Cardiology/pathology and gastroenterology  Functional Status: Bedbound  Discharged to: SNF  08-03-22: Multidisciplinary conversation involving myself, case management, nurse, patient, we spoke on the speakerphone with the contact at the nursing home to try to address any deficiencies in the patient's application so that her skilled nursing facility placement could be facilitated.  Greatly appreciate case management's assistance  08-04-22: Patient with intermittent delirium.  Thought that she went to a party last night.  Remains pleasant.  Attempted to redirect.  Still working on [facility]  08-05-22 still working on discharge snf appreciate case management help     Hospital Course   Presentation History   76 years old female with history of hypertension, type 2 diabetes, hyperlipidemia, osteoarthritis, bedbound, history of bilateral DVT who presented for evaluation of generalized weakness presented for weakness and inability to care for herself admitted with a diagnosis of UTI and NSTEMI cardiology on board, Noted trended down H/H.    See HPI for details.    Hospital Course (12 Days)     Anemia  Hemoglobin has been dropping from  From 12-10--8.2  I have ordered H&H monitor every 6 hours  Fecal occult blood test  Given patient has history of DVT I will continue the anticoagulation for now  Fecal occult blood test  Will monitor closely  No obvious bleeding  noted  11/29: So far H&H stable between 8-9, continue heparin drip, pending hematology evaluation) final recommendation, fecal occult blood test pending, patient had 1 bowel movement yesterday which is brown, no obvious bleeding noted  11/30: H&H stayed stable, patient switched back to Pradaxa per heme-onc recommendation, fecal occult blood test came positive, GI consulted to weigh in, started on PPI  12/1-12/5: Discussed with GI, recommended outpatient EGD colonoscopy and Protonix 40 mg twice daily for a month followed by daily, H&H remained stable in fact uptrending plan communicated with the patient as well as niece    Sepsis likely from UTI  Presented with leukocytosis and tachycardia  CT scan of the chest showed small left pleural effusion and evidence of pulmonary hypertension  UA consistent with UTI  Blood culture and urine culture came negative  Initially started on empirical broad-spectrum antibiotic vancomycin and Zosyn  MRSA test is negative  CT abdomen showed nonobstructive stone and small bladder stone  Antibiotic de-escalated to ceftriaxone  Leukocytosis improved  11/29-11/30: Patient will complete 5 days course of antibiotics today        History of DVT  Patient had bilateral lower extremity DVT back in September 2023 currently on Pradaxa  Repeat venous ultrasound showed left femoral popliteal DVT extending to the left peroneal vein and posterior tibialis vein with associated mild soft tissue swelling/edema and short segment nonocclusive thrombus within the right common femoral vein  I will involve hematology to guide on treatment  11/29: CTA ruled out PE, for  now we will continue heparin drip, will switch her back to DOAC once hematology clears  11/30: Patient is a switch to Pradaxa after hematology cleared the patient  12/1-12/5: Patient tolerated Pradaxa, outpatient follow-up with hematology    NSTEMI  Elevated troponin  Likely demand ischemia  Troponin trended up from 90-108  Patient is on  aspirin, statin and Pradaxa  Cardiology on board, recommended to get CTA  Follow-up recommendation  11/29-12/5: 2D echo showed normal EF, no LV wall motion abnormality, elevated troponin is likely from demand ischemia and anemia, continue aspirin/statin/Coreg/losartan.  Outpatient follow-up with cardiology        Chronic stable medical condition  Hypertension: Continue amlodipine/metoprolol/losartan  11/29-12/5: Blood pressure within acceptable range, outpatient follow-up with PCP     Type 2 diabetes  Carb controlled diet  Sliding scale  Hypoglycemia protocol  12/1-12/5: Increase Lantus to 10, continue sliding scale, and carb controlled diet.  12/5: Blood sugar low 200s, I have increased the Lantus to 12, continue to monitor POC    Morbid obesity  Outpatient follow-up     Bedbound  Wound on her left iliac area  Wound nurse consult       Procedures/Imaging:   Upon my review: Negative except discussed above         Progress Note/Physical Exam at Discharge     Subjective:Patient is stable for discharge.    Vitals:    08/05/22 0840 08/05/22 1245 08/05/22 1610 08/05/22 1926   BP: 143/73 98/55 104/67 99/56   Pulse: 85 97 85 93   Resp:  '18 18 19   '$ Temp:   97.9 F (36.6 C) 99.3 F (37.4 C)   TempSrc:   Oral Oral   SpO2:  99% 100% 98%   Weight:       Height:               General: Chronically sick looking, NAD, AAOx1  HEENT: Sclera anicteric, no conjunctival injection, OP: Clear, MMM  Neck: Supple, FROM, no LAD  Cardiovascular: RRR, no m/r/g  Lungs: CTAB, no w/r/r  Abdomen: Soft, +BS, NT/ND, no masses, no g/r  Extremities: No C/C/E  Skin: No rashes or lesions noted  Neuro: Weak all over but able to follow commands and move all extremities       Diagnostics     Labs/Studies Pending at Discharge: Yes outpatient EGD and colonoscopy    Last Labs   Recent Labs   Lab 08/05/22  0253 08/03/22  0422 08/01/22  0312   WBC 9.67* 8.89 9.96*   RBC 2.56* 2.57* 2.78*   Hgb 8.3* 8.2* 8.8*   Hematocrit 25.6* 26.0* 28.2*   MCV 100.0* 101.2*  101.4*   Platelets 419* 442* 550*         Recent Labs   Lab 08/04/22  1036   Sodium 141   Potassium 4.4   Chloride 104   CO2 29   BUN 19.0   Creatinine 0.7   Glucose 191*   Calcium 9.1         Microbiology Results (last 15 days)       Procedure Component Value Units Date/Time    MRSA culture [id number] Collected: 07/24/22 1932    Order Status: Completed Specimen: Culturette from Nasal Swab Updated: 07/26/22 0214     Culture MRSA Surveillance Negative for Methicillin Resistant Staph aureus    MRSA culture [id number] Collected: 07/24/22 1932    Order Status: Completed Specimen: Culturette from Throat Updated: 07/26/22 0214  Culture MRSA Surveillance Negative for Methicillin Resistant Staph aureus    Urine culture [id number] Collected: 07/24/22 1932    Order Status: Completed Specimen: Bladder Updated: 07/26/22 0413    Narrative:      ORDER#: [id number]                                    ORDERED BY: Enis Gash  SOURCE: Urine                                        COLLECTED:  07/24/22 19:32  ANTIBIOTICS AT COLL.:                                RECEIVED :  07/24/22 20:10  Culture Urine                              FINAL       07/26/22 04:13  07/26/22   No growth of >1,000 CFU/ML, No further work      Culture Blood Aerobic and Anaerobic [id number] Collected: 07/23/22 2218    Order Status: Completed Specimen: Blood, Venipuncture Updated: 07/29/22 0421    Narrative:      The order will result in two separate 8-64m bottles  Please do NOT order repeat blood cultures if one has been  drawn within the last 48 hours  UNLESS concerned for  endocarditis  AVOID BLOOD CULTURE DRAWS FROM CENTRAL LINE IF POSSIBLE  Indications:->Sepsis  ORDER#: [id number]                                   ORDERED BY: BEnis Gash SOURCE: Blood, Venipuncture                          COLLECTED:  07/23/22 22:18  ANTIBIOTICS AT COLL.:                                RECEIVED :  07/24/22 01:42  Culture Blood Aerobic and Anaerobic        FINAL        07/29/22 04:21  07/29/22   No growth after 5 days of incubation.      Culture Blood Aerobic and Anaerobic [[id number]: 07/23/22 2218    Order Status: Completed Specimen: Blood, Venipuncture Updated: 07/29/22 0421    Narrative:      The order will result in two separate 8-121mbottles  Please do NOT order repeat blood cultures if one has been  drawn within the last 48 hours  UNLESS concerned for  endocarditis  AVOID BLOOD CULTURE DRAWS FROM CENTRAL LINE IF POSSIBLE  Indications:->Sepsis  ORDER#: [id number]                                  ORDERED BY: BREnis GashSOURCE: Blood, Venipuncture  COLLECTED:  07/23/22 22:18  ANTIBIOTICS AT COLL.:                                RECEIVED :  07/24/22 01:42  Culture Blood Aerobic and Anaerobic        FINAL       07/29/22 04:21  07/29/22   No growth after 5 days of incubation.      COVID-19 (SARS-CoV-2) and Influenza A/B, NAA (Liat Rapid) [id number] Collected: 07/23/22 2218    Order Status: Completed Specimen: Culturette from Nasopharyngeal Updated: 07/23/22 2259     Purpose of COVID testing Diagnostic -[facility]     SARS-CoV-2 Specimen Source Nasal Swab     SARS CoV 2 Overall Result Not Detected     Comment: __________________________________________________  -A result of "Detected" indicates POSITIVE for the    presence of SARS CoV-2 RNA  -A result of "Not Detected" indicates NEGATIVE for the    presence of SARS CoV-2 RNA  __________________________________________________________  Test performed using the Roche cobas Liat SARS-CoV-2 assay. This assay is  only for use under the Food and Drug Administration's Emergency Use  Authorization. This is a real-time RT-PCR assay for the qualitative  detection of SARS-CoV-2 RNA. Viral nucleic acids may persist in vivo,  independent of viability. Detection of viral nucleic acid does not imply the  presence of infectious virus, or that virus nucleic acid is the cause of  clinical symptoms. Negative  results do not preclude SARS-CoV-2 infection and  should not be used as the sole basis for diagnosis, treatment or other  patient management decisions. Negative results must be combined with  clinical observations, patient history, and/or epidemiological information.  Invalid results may be due to inhibiting substances in the specimen and  recollection should occur. Please see Fact Sheets for patients and providers  located:  https://www.benson-chung.com/          Influenza A Not Detected     Influenza B Not Detected     Comment: Test performed using the Roche cobas Liat SARS-CoV-2 & Influenza A/B assay.  This assay is only for use under the Food and Drug Administration's  Emergency Use Authorization. This is a multiplex real-time RT-PCR assay  intended for the simultaneous in vitro qualitative detection and  differentiation of SARS-CoV-2, influenza A, and influenza B virus RNA. Viral  nucleic acids may persist in vivo, independent of viability. Detection of  viral nucleic acid does not imply the presence of infectious virus, or that  virus nucleic acid is the cause of clinical symptoms. Negative results do  not preclude SARS-CoV-2, influenza A, and/or influenza B infection and  should not be used as the sole basis for diagnosis, treatment or other  patient management decisions. Negative results must be combined with  clinical observations, patient history, and/or epidemiological information.  Invalid results may be due to inhibiting substances in the specimen and  recollection should occur. Please see Fact Sheets for patients and providers  located: http://olson-hall.info/.         Narrative:      o Collect and clearly label specimen type:  o PREFERRED-Upper respiratory specimen: One Nasal Swab in  Transport Media.  o Hand deliver to laboratory ASAP  Diagnostic -[facility]             Patient Instructions   Discharge Diet: Heart healthy diet  Discharge Activity: As tolerated    Follow Up  Appointment:  Follow-up Information       West Carbo, MD Follow up in 1 week(s).    Specialties: Gastroenterology, Internal Medicine  Contact information:  [place] [address]  [phone]               Tia Alert, MD Follow up in 1 week(s).    Specialties: Medical Oncology, Hematology  Contact information:  [address]  [place] [address]  [phone]               Vonzell Schlatter, MD Follow up in 1 week(s).    Specialties: Interventional Cardiology, Internal Medicine, Cardiology  Contact information:  [place]  [place] [address]  [phone]               Pcp, None, MD .                              Discharge Medications:     Medication List        START taking these medications      aspirin 81 MG chewable tablet  Chew 1 tablet (81 mg) by mouth daily     atorvastatin 40 MG tablet  Commonly known as: LIPITOR  Take 1 tablet (40 mg) by mouth nightly     carboxymethylcellulose 0.5 % ophthalmic solution  Commonly known as: REFRESH TEARS  Place 1 drop into both eyes 3 (three) times daily as needed (dry eye)     insulin glargine 100 UNIT/ML injection  Commonly known as: LANTUS  Inject 12 Units into the skin nightly     pantoprazole 40 MG tablet  Commonly known as: PROTONIX  Take 1 tablet (40 mg) by mouth 2 (two) times daily for 30 days, THEN 1 tablet (40 mg) daily.  Start taking on: July 30, 2022            CONTINUE taking these medications      acetaminophen 500 MG tablet  Commonly known as: TYLENOL     CENTRUM SILVER 50+WOMEN PO     dabigatran 150 MG Caps  Commonly known as: PRADAXA     losartan 100 MG TABS, hydroCHLOROthiazide 25 MG TABS     metoprolol succinate 200 MG 24 hr tablet  Commonly known as: TOPROL-XL     TURMERIC PO     valACYclovir 1000 MG tablet  Commonly known as: VALTREX     vitamin D (ergocalciferol) 50000 UNIT Caps  Commonly known as: DRISDOL            STOP taking these medications      amLODIPine 5 MG tablet  Commonly known  as: NORVASC     HUMULIN 70/30 KWIKPEN SC               Where to Get Your Medications        These medications were sent to [place] [place] - [address]  [address], [place] [address]      Phone: [phone]   aspirin 81 MG chewable tablet  atorvastatin 40 MG tablet  pantoprazole 40 MG tablet       Information about where to get these medications is not yet available    Ask your nurse or doctor about these medications  carboxymethylcellulose 0.5 % ophthalmic solution  insulin glargine 100 UNIT/ML injection  Time spent examining patient, discussing with patient/family regarding hospital course, chart review, reconciling medications and discharge planning: 31 minutes.    Signed,  Danelle Earthly, MD    7:50 PM 08/05/2022

## 2022-08-06 LAB — GLUCOSE WHOLE BLOOD - POCT
Whole Blood Glucose POCT: 176 mg/dL — ABNORMAL HIGH (ref 70–100)
Whole Blood Glucose POCT: 260 mg/dL — ABNORMAL HIGH (ref 70–100)
Whole Blood Glucose POCT: 273 mg/dL — ABNORMAL HIGH (ref 70–100)
Whole Blood Glucose POCT: 273 mg/dL — ABNORMAL HIGH (ref 70–100)

## 2022-08-06 NOTE — Plan of Care (Addendum)
NURSING SHIFT NOTE     Patient: Meredith Baker  Day: 13      SHIFT EVENTS     Shift Narrative/Significant Events (PRN med administration, fall, RRT, etc.):     Patient alert and oriented x4. Pt RA, IV clean and intact, and tele in place and monitoring. Pt denies any SOB or chest pain. Pt tolerated three meals, eager to eat. Wound care provided per order. Hemoglobin noticed to be trending down, no noticeable blood in stool. Pt seems to be weaker since admission. Safety and fall precautions remain in place. Purposeful rounding completed.          ASSESSMENT     Changes in assessment from patient's baseline this shift:    Neuro: No  CV: No  Pulm: No  Peripheral Vascular: No  HEENT: No  GI: No  BM during shift: Yes   , Last BM: 08/06/2022  GU: No   Integ: No  MS: No    Pain: Improved  Pain Interventions: Rest, and Positioning  Medications Utilized: None    Mobility: PMP Activity: Step 3 - Bed Mobility of Distance Walked (ft) (Step 6,7): 0 Feet           Lines     Patient Lines/Drains/Airways Status       Active Lines, Drains and Airways       Name Placement date Placement time Site Days    Peripheral IV 07/24/22 20 G Anterior;Left Forearm 07/24/22  0030  Forearm  13    External Urinary Catheter 07/23/22  1602  --  13                         VITAL SIGNS     Vitals:    08/06/22 1228   BP: 108/74   Pulse: 85   Resp: 19   Temp: 98.2 F (36.8 C)   SpO2: 99%       Temp  Min: 97.9 F (36.6 C)  Max: 99.3 F (37.4 C)  Pulse  Min: 85  Max: 94  Resp  Min: 17  Max: 19  BP  Min: 95/63  Max: 136/81  SpO2  Min: 97 %  Max: 100 %    No intake or output data in the 24 hours ending 08/06/22 1359           CARE PLAN         Problem: Pain interferes with ability to perform ADL  Goal: Pain at adequate level as identified by patient  Outcome: Progressing  Flowsheets (Taken 08/02/2022 1843 by Delice Bison, RN)  Pain at adequate level as identified by patient:   Identify patient comfort function goal   Assess pain on admission, during  daily assessment and/or before any "as needed" intervention(s)   Evaluate if patient comfort function goal is met   Evaluate patient's satisfaction with pain management progress   Offer non-pharmacological pain management interventions   Include patient/patient care companion in decisions related to pain management as needed     Problem: Side Effects from Pain Analgesia  Goal: Patient will experience minimal side effects of analgesic therapy  Outcome: Progressing  Flowsheets (Taken 08/02/2022 0534 by Derrill Memo, RN)  Patient will experience minimal side effects of analgesic therapy: Monitor/assess patient's respiratory status (RR depth, effort, breath sounds)     Problem: Moderate/High Fall Risk Score >5  Goal: Patient will remain free of falls  Outcome: Progressing  Flowsheets (Taken 08/06/2022 0805)  High (Greater than 13):   HIGH-Consider use of low bed   HIGH-Initiate use of floor mats as appropriate   HIGH-Apply yellow "Fall Risk" arm band   HIGH-Bed alarm on at all times while patient in bed     Problem: Compromised Tissue integrity  Goal: Damaged tissue is healing and protected  Outcome: Progressing  Flowsheets (Taken 08/06/2022 0805)  Damaged tissue is healing and protected:   Monitor/assess Braden scale every shift   Provide wound care per wound care algorithm   Reposition patient every 2 hours and as needed unless able to reposition self   Avoid shearing injuries   Keep intact skin clean and dry   Use bath wipes, not soap and water, for daily bathing  Goal: Nutritional status is improving  Outcome: Progressing  Flowsheets (Taken 08/06/2022 0805)  Nutritional status is improving: Allow adequate time for meals     Problem: Safety  Goal: Patient will be free from injury during hospitalization  Outcome: Progressing  Flowsheets (Taken 08/03/2022 2342 by Elodia Florence, RN)  Patient will be free from injury during hospitalization:   Assess patient's risk for falls and implement fall prevention plan of care per  policy   Provide and maintain safe environment   Hourly rounding  Goal: Patient will be free from infection during hospitalization  Outcome: Progressing  Flowsheets (Taken 08/01/2022 1833 by Delice Bison, RN)  Free from Infection during hospitalization:   Assess and monitor for signs and symptoms of infection   Monitor lab/diagnostic results   Encourage patient and family to use good hand hygiene technique     Problem: Fluid and Electrolyte Imbalance/ Endocrine  Goal: Fluid and electrolyte balance are achieved/maintained  Outcome: Progressing  Goal: Adequate hydration  Outcome: Progressing  Flowsheets (Taken 08/02/2022 1843 by Delice Bison, RN)  Adequate hydration:   Monitor and assess vital signs and perfusion   Assess for peripheral, sacral, periorbital and abdominal edema   Assess mucus membranes, skin color, turgor, perfusion and presence of edema     Problem: Hemodynamic Status: Cardiac  Goal: Stable vital signs and fluid balance  Outcome: Progressing  Flowsheets (Taken 07/26/2022 2107 by Elodia Florence, RN)  Stable vital signs and fluid balance:   Assess signs and symptoms associated with cardiac rhythm changes   Monitor lab values     Problem: Bladder/Voiding  Goal: Patient will experience proper bladder emptying during admission and remain free from infection  Outcome: Progressing  Flowsheets (Taken 08/06/2022 1358)  Patient will experience proper bladder emptying during admission and remain free from infection: Apply urinary containment device as appropriate and/or per order     Problem: Diabetes: Glucose Imbalance  Goal: Blood glucose stable at established goal  Outcome: Progressing  Flowsheets (Taken 08/03/2022 2342 by Elodia Florence, RN)  Blood glucose stable at established goal:   Monitor lab values   Assess for hypoglycemia /hyperglycemia   Ensure patient/family has adequate teaching materials

## 2022-08-06 NOTE — Plan of Care (Incomplete)
NURSING SHIFT NOTE     Patient: Meredith Baker  Day: 13      SHIFT EVENTS     Shift Narrative/Significant Events (PRN med administration, fall, RRT, etc.):     Assumed pt responsibility, axo x4, on room air, pt bed bound, reported pain in her legs and feet bilaterally, comfort measures provided to pt, pt turned on left side, scheduled medications given as ordered. No changes to pts condition to report, waiting on placement.     Safety and fall precautions remain in place. Purposeful rounding completed.          ASSESSMENT     Changes in assessment from patient's baseline this shift:    Neuro: {yes:21351::"No"}  CV: {yes:21351::"No"}  Pulm: {yes:21351::"No"}  Peripheral Vascular: {yes:21351::"No"}  HEENT: {yes:21351::"No"}  GI: {yes:21351::"No"}  BM during shift: {yes:21351::"No"}, Last BM: Last BM Date: 08/06/22  GU: {yes:21351::"No"}   Integ: {yes:21351::"No"}  MS: {yes:21351::"No"}    Pain: {Pain at site:39313::"None"}  Pain Interventions: {RELIEVING FACTORS:28901}  Medications Utilized: {Meds; pain:60255}    Mobility: PMP Activity: Step 3 - Bed Mobility of Distance Walked (ft) (Step 6,7): 0 Feet           Lines     Patient Lines/Drains/Airways Status       Active Lines, Drains and Airways       Name Placement date Placement time Site Days    Peripheral IV 07/24/22 20 G Anterior;Left Forearm 07/24/22  0030  Forearm  13    External Urinary Catheter 07/23/22  1602  --  14                         VITAL SIGNS     Vitals:    08/06/22 1904   BP: 117/65   Pulse: (!) 101   Resp: 17   Temp: 99 F (37.2 C)   SpO2: 99%       Temp  Min: 98.1 F (36.7 C)  Max: 99 F (37.2 C)  Pulse  Min: 85  Max: 101  Resp  Min: 16  Max: 19  BP  Min: 95/63  Max: 136/81  SpO2  Min: 97 %  Max: 99 %      Intake/Output Summary (Last 24 hours) at 08/06/2022 2028  Last data filed at 08/06/2022 1605  Gross per 24 hour   Intake 480 ml   Output --   Net 480 ml                  CARE PLAN        Problem: Pain interferes with ability to perform  ADL  Goal: Pain at adequate level as identified by patient  Outcome: Progressing  Flowsheets (Taken 08/02/2022 1843 by Delice Bison, RN)  Pain at adequate level as identified by patient:   Identify patient comfort function goal   Assess pain on admission, during daily assessment and/or before any "as needed" intervention(s)   Evaluate if patient comfort function goal is met   Evaluate patient's satisfaction with pain management progress   Offer non-pharmacological pain management interventions   Include patient/patient care companion in decisions related to pain management as needed     Problem: Side Effects from Pain Analgesia  Goal: Patient will experience minimal side effects of analgesic therapy  Outcome: Progressing  Flowsheets (Taken 08/02/2022 0534 by Derrill Memo, RN)  Patient will experience minimal side effects of analgesic therapy: Monitor/assess patient's respiratory status (RR depth, effort, breath sounds)  Problem: Moderate/High Fall Risk Score >5  Goal: Patient will remain free of falls  Outcome: Progressing  Flowsheets  Taken 08/06/2022 0805 by Mahlon Gammon, RN  High (Greater than 13):   HIGH-Consider use of low bed   HIGH-Initiate use of floor mats as appropriate   HIGH-Apply yellow "Fall Risk" arm band   HIGH-Bed alarm on at all times while patient in bed  Taken 08/02/2022 1600 by Delice Bison, RN  Moderate Risk (6-13):   MOD- Consider video monitoring   MOD-Use gait belt when appropriate   MOD-Perform dangle, stand, walk (DSW) prior to mobilization   MOD-Utilize diversion activities   MOD-Re-orient confused patients   MOD-Floor mat at bedside (where available) if appropriate   MOD-Apply bed exit alarm if patient is confused  Taken 07/29/2022 1207 by Mechele Dawley, RN  VH High Risk (Greater than 13):   ALL REQUIRED LOW INTERVENTIONS   ALL REQUIRED MODERATE INTERVENTIONS   BED ALARM WILL BE ACTIVATED WHEN THE PATEINT IS IN BED WITH SIGNAGE "RESET BED ALARM"   Include  family/significant other in multidisciplinary discussion regarding plan of care as appropriate   Request PT/OT therapy consult order from physician for patients with gait/mobility impairment   Use of floor mat     Problem: Compromised Tissue integrity  Goal: Damaged tissue is healing and protected  Outcome: Progressing  Flowsheets (Taken 08/06/2022 0805 by Mahlon Gammon, RN)  Damaged tissue is healing and protected:   Monitor/assess Braden scale every shift   Provide wound care per wound care algorithm   Reposition patient every 2 hours and as needed unless able to reposition self   Avoid shearing injuries   Keep intact skin clean and dry   Use bath wipes, not soap and water, for daily bathing  Goal: Nutritional status is improving  Outcome: Progressing  Flowsheets (Taken 08/06/2022 0805 by Mahlon Gammon, RN)  Nutritional status is improving: Allow adequate time for meals     Problem: Safety  Goal: Patient will be free from injury during hospitalization  Outcome: Progressing  Flowsheets (Taken 08/03/2022 2342 by Elodia Florence, RN)  Patient will be free from injury during hospitalization:   Assess patient's risk for falls and implement fall prevention plan of care per policy   Provide and maintain safe environment   Hourly rounding  Goal: Patient will be free from infection during hospitalization  Outcome: Progressing  Flowsheets (Taken 08/01/2022 1833 by Delice Bison, RN)  Free from Infection during hospitalization:   Assess and monitor for signs and symptoms of infection   Monitor lab/diagnostic results   Encourage patient and family to use good hand hygiene technique     Problem: Fluid and Electrolyte Imbalance/ Endocrine  Goal: Fluid and electrolyte balance are achieved/maintained  Outcome: Progressing  Flowsheets (Taken 08/01/2022 1833 by Delice Bison, RN)  Fluid and electrolyte balance are achieved/maintained:   Monitor/assess lab values and report abnormal values   Monitor for muscle weakness    Assess and reassess fluid and electrolyte status   Observe for cardiac arrhythmias  Goal: Adequate hydration  Outcome: Progressing  Flowsheets (Taken 08/02/2022 1843 by Delice Bison, RN)  Adequate hydration:   Monitor and assess vital signs and perfusion   Assess for peripheral, sacral, periorbital and abdominal edema   Assess mucus membranes, skin color, turgor, perfusion and presence of edema     Problem: Hemodynamic Status: Cardiac  Goal: Stable vital signs and fluid balance  Outcome: Progressing  Flowsheets (Taken 07/26/2022 2107  by Elodia Florence, RN)  Stable vital signs and fluid balance:   Assess signs and symptoms associated with cardiac rhythm changes   Monitor lab values     Problem: Bladder/Voiding  Goal: Patient will experience proper bladder emptying during admission and remain free from infection  Outcome: Progressing  Flowsheets (Taken 08/06/2022 1358 by Reunion, RN)  Patient will experience proper bladder emptying during admission and remain free from infection: Apply urinary containment device as appropriate and/or per order     Problem: Diabetes: Glucose Imbalance  Goal: Blood glucose stable at established goal  Outcome: Progressing  Flowsheets (Taken 08/03/2022 2342 by Elodia Florence, RN)  Blood glucose stable at established goal:   Monitor lab values   Assess for hypoglycemia /hyperglycemia   Ensure patient/family has adequate teaching materials

## 2022-08-06 NOTE — Discharge Summary (Signed)
[facility]      Patient: Meredith Baker  Admission Date: 07/23/2022   DOB: 25-Jan-1946  Discharge Date: 08/06/2022    MRN: [id number]  Discharge Attending: Vernona Rieger, MD     Referring Physician: Pcp, None, MD  PCP: Pcp, None, MD       DISCHARGE SUMMARY     Discharge Information   Admission Diagnosis:   NSTEMI (non-ST elevated myocardial infarction)    Discharge Diagnosis:   Active Hospital Problems    Diagnosis    NSTEMI (non-ST elevated myocardial infarction)        Admission Condition: Guarded  Discharge Condition: Stable and improved  Consultants: Cardiology/pathology and gastroenterology  Functional Status: Bedbound  Discharged to: SNF  08-03-22: Multidisciplinary conversation involving myself, case management, nurse, patient, we spoke on the speakerphone with the contact at the nursing home to try to address any deficiencies in the patient's application so that her skilled nursing facility placement could be facilitated.  Greatly appreciate case management's assistance  08-04-22: Patient with intermittent delirium.  Thought that she went to a party last night.  Remains pleasant.  Attempted to redirect.  Still working on [facility]  08-05-22 still working on discharge snf appreciate case management help  12-9 still disorented thinks its [facility] Course   Presentation History   76 years old female with history of hypertension, type 2 diabetes, hyperlipidemia, osteoarthritis, bedbound, history of bilateral DVT who presented for evaluation of generalized weakness presented for weakness and inability to care for herself admitted with a diagnosis of UTI and NSTEMI cardiology on board, Noted trended down H/H.    See HPI for details.    Hospital Course (13 Days)     Anemia  Hemoglobin has been dropping from  From 12-10--8.2  I have ordered H&H monitor every 6 hours  Fecal occult blood test  Given patient has history of DVT I will continue the anticoagulation for now  Fecal occult blood test  Will monitor  closely  No obvious bleeding noted  11/29: So far H&H stable between 8-9, continue heparin drip, pending hematology evaluation) final recommendation, fecal occult blood test pending, patient had 1 bowel movement yesterday which is brown, no obvious bleeding noted  11/30: H&H stayed stable, patient switched back to Pradaxa per heme-onc recommendation, fecal occult blood test came positive, GI consulted to weigh in, started on PPI  12/1-12/5: Discussed with GI, recommended outpatient EGD colonoscopy and Protonix 40 mg twice daily for a month followed by daily, H&H remained stable in fact uptrending plan communicated with the patient as well as niece    Sepsis likely from UTI  Presented with leukocytosis and tachycardia  CT scan of the chest showed small left pleural effusion and evidence of pulmonary hypertension  UA consistent with UTI  Blood culture and urine culture came negative  Initially started on empirical broad-spectrum antibiotic vancomycin and Zosyn  MRSA test is negative  CT abdomen showed nonobstructive stone and small bladder stone  Antibiotic de-escalated to ceftriaxone  Leukocytosis improved  11/29-11/30: Patient will complete 5 days course of antibiotics today        History of DVT  Patient had bilateral lower extremity DVT back in September 2023 currently on Pradaxa  Repeat venous ultrasound showed left femoral popliteal DVT extending to the left peroneal vein and posterior tibialis vein with associated mild soft tissue swelling/edema and short segment nonocclusive thrombus within the right common femoral vein  I will involve hematology to guide on treatment  11/29: CTA ruled out PE, for now we will continue heparin drip, will switch her back to DOAC once hematology clears  11/30: Patient is a switch to Pradaxa after hematology cleared the patient  12/1-12/5: Patient tolerated Pradaxa, outpatient follow-up with hematology    NSTEMI  Elevated troponin  Likely demand ischemia  Troponin trended up from  90-108  Patient is on aspirin, statin and Pradaxa  Cardiology on board, recommended to get CTA  Follow-up recommendation  11/29-12/5: 2D echo showed normal EF, no LV wall motion abnormality, elevated troponin is likely from demand ischemia and anemia, continue aspirin/statin/Coreg/losartan.  Outpatient follow-up with cardiology        Chronic stable medical condition  Hypertension: Continue amlodipine/metoprolol/losartan  11/29-12/5: Blood pressure within acceptable range, outpatient follow-up with PCP     Type 2 diabetes  Carb controlled diet  Sliding scale  Hypoglycemia protocol  12/1-12/5: Increase Lantus to 10, continue sliding scale, and carb controlled diet.  12/5: Blood sugar low 200s, I have increased the Lantus to 12, continue to monitor POC    Morbid obesity  Outpatient follow-up     Bedbound  Wound on her left iliac area  Wound nurse consult       Procedures/Imaging:   Upon my review: Negative except discussed above         Progress Note/Physical Exam at Discharge     Subjective:Patient is stable for discharge.    Vitals:    08/06/22 0833 08/06/22 1228 08/06/22 1519 08/06/22 1904   BP: 121/73 108/74 103/65 117/65   Pulse: 85 85 98 (!) 101   Resp:  '19 16 17   '$ Temp:  98.2 F (36.8 C) 98.2 F (36.8 C) 99 F (37.2 C)   TempSrc:  Oral Oral Oral   SpO2:  99% 99% 99%   Weight:       Height:               General: Chronically sick looking, NAD, AAOx1  HEENT: Sclera anicteric, no conjunctival injection, OP: Clear, MMM  Neck: Supple, FROM, no LAD  Cardiovascular: RRR, no m/r/g  Lungs: CTAB, no w/r/r  Abdomen: Soft, +BS, NT/ND, no masses, no g/r  Extremities: No C/C/E  Skin: No rashes or lesions noted  Neuro: Weak all over but able to follow commands and move all extremities       Diagnostics     Labs/Studies Pending at Discharge: Yes outpatient EGD and colonoscopy    Last Labs   Recent Labs   Lab 08/05/22  0253 08/03/22  0422 08/01/22  0312   WBC 9.67* 8.89 9.96*   RBC 2.56* 2.57* 2.78*   Hgb 8.3* 8.2* 8.8*    Hematocrit 25.6* 26.0* 28.2*   MCV 100.0* 101.2* 101.4*   Platelets 419* 442* 550*         Recent Labs   Lab 08/04/22  1036   Sodium 141   Potassium 4.4   Chloride 104   CO2 29   BUN 19.0   Creatinine 0.7   Glucose 191*   Calcium 9.1         Microbiology Results (last 15 days)       Procedure Component Value Units Date/Time    MRSA culture [id number] Collected: 07/24/22 1932    Order Status: Completed Specimen: Culturette from Nasal Swab Updated: 07/26/22 0214     Culture MRSA Surveillance Negative for Methicillin Resistant Staph aureus    MRSA culture [id number] Collected: 07/24/22 1932    Order Status:  Completed Specimen: Culturette from Throat Updated: 07/26/22 0214     Culture MRSA Surveillance Negative for Methicillin Resistant Staph aureus    Urine culture [id number] Collected: 07/24/22 1932    Order Status: Completed Specimen: Bladder Updated: 07/26/22 0413    Narrative:      ORDER#: [id number]                                    ORDERED BY: Enis Gash  SOURCE: Urine                                        COLLECTED:  07/24/22 19:32  ANTIBIOTICS AT COLL.:                                RECEIVED :  07/24/22 20:10  Culture Urine                              FINAL       07/26/22 04:13  07/26/22   No growth of >1,000 CFU/ML, No further work      Culture Blood Aerobic and Anaerobic [id number] Collected: 07/23/22 2218    Order Status: Completed Specimen: Blood, Venipuncture Updated: 07/29/22 0421    Narrative:      The order will result in two separate 8-31m bottles  Please do NOT order repeat blood cultures if one has been  drawn within the last 48 hours  UNLESS concerned for  endocarditis  AVOID BLOOD CULTURE DRAWS FROM CENTRAL LINE IF POSSIBLE  Indications:->Sepsis  ORDER#: [id number]                                   ORDERED BY: BEnis Gash SOURCE: Blood, Venipuncture                          COLLECTED:  07/23/22 22:18  ANTIBIOTICS AT COLL.:                                RECEIVED :  07/24/22  01:42  Culture Blood Aerobic and Anaerobic        FINAL       07/29/22 04:21  07/29/22   No growth after 5 days of incubation.      Culture Blood Aerobic and Anaerobic [[id number]: 07/23/22 2218    Order Status: Completed Specimen: Blood, Venipuncture Updated: 07/29/22 0421    Narrative:      The order will result in two separate 8-174mbottles  Please do NOT order repeat blood cultures if one has been  drawn within the last 48 hours  UNLESS concerned for  endocarditis  AVOID BLOOD CULTURE DRAWS FROM CENTRAL LINE IF POSSIBLE  Indications:->Sepsis  ORDER#: [id number]                                  ORDERED BY: BREnis GashSOURCE: Blood, Venipuncture  COLLECTED:  07/23/22 22:18  ANTIBIOTICS AT COLL.:                                RECEIVED :  07/24/22 01:42  Culture Blood Aerobic and Anaerobic        FINAL       07/29/22 04:21  07/29/22   No growth after 5 days of incubation.      COVID-19 (SARS-CoV-2) and Influenza A/B, NAA (Liat Rapid) [id number] Collected: 07/23/22 2218    Order Status: Completed Specimen: Culturette from Nasopharyngeal Updated: 07/23/22 2259     Purpose of COVID testing Diagnostic -PUI     SARS-CoV-2 Specimen Source Nasal Swab     SARS CoV 2 Overall Result Not Detected     Comment: __________________________________________________  -A result of "Detected" indicates POSITIVE for the    presence of SARS CoV-2 RNA  -A result of "Not Detected" indicates NEGATIVE for the    presence of SARS CoV-2 RNA  __________________________________________________________  Test performed using the Roche cobas Liat SARS-CoV-2 assay. This assay is  only for use under the Food and Drug Administration's Emergency Use  Authorization. This is a real-time RT-PCR assay for the qualitative  detection of SARS-CoV-2 RNA. Viral nucleic acids may persist in vivo,  independent of viability. Detection of viral nucleic acid does not imply the  presence of infectious virus, or that virus  nucleic acid is the cause of  clinical symptoms. Negative results do not preclude SARS-CoV-2 infection and  should not be used as the sole basis for diagnosis, treatment or other  patient management decisions. Negative results must be combined with  clinical observations, patient history, and/or epidemiological information.  Invalid results may be due to inhibiting substances in the specimen and  recollection should occur. Please see Fact Sheets for patients and providers  located:  https://www.benson-chung.com/          Influenza A Not Detected     Influenza B Not Detected     Comment: Test performed using the Roche cobas Liat SARS-CoV-2 & Influenza A/B assay.  This assay is only for use under the Food and Drug Administration's  Emergency Use Authorization. This is a multiplex real-time RT-PCR assay  intended for the simultaneous in vitro qualitative detection and  differentiation of SARS-CoV-2, influenza A, and influenza B virus RNA. Viral  nucleic acids may persist in vivo, independent of viability. Detection of  viral nucleic acid does not imply the presence of infectious virus, or that  virus nucleic acid is the cause of clinical symptoms. Negative results do  not preclude SARS-CoV-2, influenza A, and/or influenza B infection and  should not be used as the sole basis for diagnosis, treatment or other  patient management decisions. Negative results must be combined with  clinical observations, patient history, and/or epidemiological information.  Invalid results may be due to inhibiting substances in the specimen and  recollection should occur. Please see Fact Sheets for patients and providers  located: http://olson-hall.info/.         Narrative:      o Collect and clearly label specimen type:  o PREFERRED-Upper respiratory specimen: One Nasal Swab in  Transport Media.  o Hand deliver to laboratory ASAP  Diagnostic -[facility]I             Patient Instructions   Discharge Diet: Heart  healthy diet  Discharge Activity: As tolerated    Follow Up Appointment:  Follow-up Information       West Carbo, MD Follow up in 1 week(s).    Specialties: Gastroenterology, Internal Medicine  Contact information:  [place] [address]  [phone]               Tia Alert, MD Follow up in 1 week(s).    Specialties: Medical Oncology, Hematology  Contact information:  [address]  [place] [address]  [phone]               Vonzell Schlatter, MD Follow up in 1 week(s).    Specialties: Interventional Cardiology, Internal Medicine, Cardiology  Contact information:  [place]  [place] [address]  [phone]               Pcp, None, MD .                              Discharge Medications:     Medication List        START taking these medications      aspirin 81 MG chewable tablet  Chew 1 tablet (81 mg) by mouth daily     atorvastatin 40 MG tablet  Commonly known as: LIPITOR  Take 1 tablet (40 mg) by mouth nightly     carboxymethylcellulose 0.5 % ophthalmic solution  Commonly known as: REFRESH TEARS  Place 1 drop into both eyes 3 (three) times daily as needed (dry eye)     insulin glargine 100 UNIT/ML injection  Commonly known as: LANTUS  Inject 12 Units into the skin nightly     pantoprazole 40 MG tablet  Commonly known as: PROTONIX  Take 1 tablet (40 mg) by mouth 2 (two) times daily for 30 days, THEN 1 tablet (40 mg) daily.  Start taking on: July 30, 2022            CONTINUE taking these medications      acetaminophen 500 MG tablet  Commonly known as: TYLENOL     CENTRUM SILVER 50+WOMEN PO     dabigatran 150 MG Caps  Commonly known as: PRADAXA     losartan 100 MG TABS, hydroCHLOROthiazide 25 MG TABS     metoprolol succinate 200 MG 24 hr tablet  Commonly known as: TOPROL-XL     TURMERIC PO     valACYclovir 1000 MG tablet  Commonly known as: VALTREX     vitamin D (ergocalciferol) 50000 UNIT Caps  Commonly known as: DRISDOL            STOP taking  these medications      amLODIPine 5 MG tablet  Commonly known as: NORVASC     HUMULIN 70/30 KWIKPEN SC               Where to Get Your Medications        These medications were sent to [place], [place] - [address]  [address] [place] [address]      Phone: [phone]   aspirin 81 MG chewable tablet  atorvastatin 40 MG tablet  pantoprazole 40 MG tablet       Information about where to get these medications is not yet available    Ask your nurse or doctor about these medications  carboxymethylcellulose 0.5 % ophthalmic solution  insulin glargine 100 UNIT/ML injection  Time spent examining patient, discussing with patient/family regarding hospital course, chart review, reconciling medications and discharge planning: 31 minutes.    Signed,  Danelle Earthly, MD    7:52 PM 08/06/2022

## 2022-08-07 LAB — BASIC METABOLIC PANEL
Anion Gap: 9 (ref 5.0–15.0)
BUN: 26 mg/dL — ABNORMAL HIGH (ref 7.0–21.0)
CO2: 28 mEq/L (ref 17–29)
Calcium: 8.6 mg/dL (ref 7.9–10.2)
Chloride: 103 mEq/L (ref 99–111)
Creatinine: 0.8 mg/dL (ref 0.4–1.0)
Glucose: 160 mg/dL — ABNORMAL HIGH (ref 70–100)
Potassium: 4.1 mEq/L (ref 3.5–5.3)
Sodium: 140 mEq/L (ref 135–145)
eGFR: >60 mL/min/{1.73_m2} (ref >=60)

## 2022-08-07 LAB — CBC
Absolute NRBC: 0 10*3/uL (ref 0.00–0.00)
Hematocrit: 25.3 % — ABNORMAL LOW (ref 34.7–43.7)
Hgb: 8 g/dL — ABNORMAL LOW (ref 11.4–14.8)
MCH: 32.1 pg (ref 25.1–33.5)
MCHC: 31.6 g/dL (ref 31.5–35.8)
MCV: 101.6 fL — ABNORMAL HIGH (ref 78.0–96.0)
MPV: 10 fL (ref 8.9–12.5)
Nucleated RBC: 0 /100 WBC (ref 0.0–0.0)
Platelets: 356 10*3/uL — ABNORMAL HIGH (ref 142–346)
RBC: 2.49 10*6/uL — ABNORMAL LOW (ref 3.90–5.10)
RDW: 18 % — ABNORMAL HIGH (ref 11–15)
WBC: 9.09 10*3/uL (ref 3.10–9.50)

## 2022-08-07 LAB — GLUCOSE WHOLE BLOOD - POCT
Whole Blood Glucose POCT: 205 mg/dL — ABNORMAL HIGH (ref 70–100)
Whole Blood Glucose POCT: 158 mg/dL — ABNORMAL HIGH (ref 70–100)
Whole Blood Glucose POCT: 208 mg/dL — ABNORMAL HIGH (ref 70–100)
Whole Blood Glucose POCT: 286 mg/dL — ABNORMAL HIGH (ref 70–100)

## 2022-08-07 LAB — PROCALCITONIN: Procalcitonin: 0.09 ng/ml (ref 0.00–0.10)

## 2022-08-07 NOTE — Plan of Care (Signed)
NURSING SHIFT NOTE     Patient: Meredith Baker  Day: 14      SHIFT EVENTS     Shift Narrative/Significant Events (PRN med administration, fall, RRT, etc.):     AOX2-3, RA, VSS. Patient was difficult to arouse this morning. MD made aware no new orders, patient became more alert and compliant around 11am. Patient had 1 BM today. Medication and education given. Patient able to verbalize pain, SOB, and dizziness, denies all. Patient bed in lowest position, call light within reach, care ongoing, no needs at this time.     Safety and fall precautions remain in place. Purposeful rounding completed.          ASSESSMENT     Changes in assessment from patient's baseline this shift:    Neuro: No  CV: No  Pulm: No  Peripheral Vascular: No  HEENT: No  GI: No  BM during shift: yes, Last BM: 08/07/22  GU: No   Integ: No  MS: No    Pain: None  Pain Interventions:   Medications Utilized:     Mobility: PMP Activity: Step 3 - Bed Mobility of Distance Walked (ft) (Step 6,7): 0 Feet           Lines     Patient Lines/Drains/Airways Status       Active Lines, Drains and Airways       Name Placement date Placement time Site Days    Peripheral IV 07/24/22 20 G Anterior;Left Forearm 07/24/22  0030  Forearm  14    External Urinary Catheter 07/23/22  1602  --  15                         VITAL SIGNS     Vitals:    08/07/22 1942   BP: 127/76   Pulse: (!) 107   Resp: 18   Temp: 98.2 F (36.8 C)   SpO2: 99%       Temp  Min: 98.1 F (36.7 C)  Max: 99.3 F (37.4 C)  Pulse  Min: 79  Max: 107  Resp  Min: 14  Max: 18  BP  Min: 96/57  Max: 133/70  SpO2  Min: 97 %  Max: 100 %    No intake or output data in the 24 hours ending 08/07/22 1949           CARE PLAN       Problem: Pain interferes with ability to perform ADL  Goal: Pain at adequate level as identified by patient  Outcome: Progressing  Flowsheets (Taken 08/07/2022 1606)  Pain at adequate level as identified by patient:   Identify patient comfort function goal   Assess for risk of opioid  induced respiratory depression, including snoring/sleep apnea. Alert healthcare team of risk factors identified.   Assess pain on admission, during daily assessment and/or before any "as needed" intervention(s)   Evaluate patient's satisfaction with pain management progress   Reassess pain within 30-60 minutes of any procedure/intervention, per Pain Assessment, Intervention, Reassessment (AIR) Cycle   Evaluate if patient comfort function goal is met   Offer non-pharmacological pain management interventions     Problem: Moderate/High Fall Risk Score >5  Goal: Patient will remain free of falls  Outcome: Progressing  Flowsheets (Taken 08/07/2022 1606)  VH High Risk (Greater than 13):   ALL REQUIRED LOW INTERVENTIONS   ALL REQUIRED MODERATE INTERVENTIONS   A CHAIR PAD ALARM WILL BE USED WHEN PATIENT IS UP  SITTING IN A CHAIR   Keep door open for better visibility   Use assistive devices   Use chair-pad alarm device   Use of floor mat     Problem: Compromised Tissue integrity  Goal: Damaged tissue is healing and protected  Outcome: Progressing  Flowsheets (Taken 08/07/2022 1606)  Damaged tissue is healing and protected:   Monitor/assess Braden scale every shift   Reposition patient every 2 hours and as needed unless able to reposition self   Provide wound care per wound care algorithm   Increase activity as tolerated/progressive mobility   Relieve pressure to bony prominences for patients at moderate and high risk   Avoid shearing injuries   Keep intact skin clean and dry   Use bath wipes, not soap and water, for daily bathing   Use incontinence wipes for cleaning urine, stool and caustic drainage. Foley care as needed   Encourage use of lotion/moisturizer on skin   Monitor external devices/tubes for correct placement to prevent pressure, friction and shearing   Monitor patient's hygiene practices  Goal: Nutritional status is improving  Outcome: Progressing  Flowsheets (Taken 08/07/2022 1606)  Nutritional status is  improving:   Assist patient with eating   Allow adequate time for meals   Encourage patient to take dietary supplement(s) as ordered     Problem: Safety  Goal: Patient will be free from injury during hospitalization  Outcome: Progressing  Flowsheets (Taken 08/07/2022 1606)  Patient will be free from injury during hospitalization:   Assess patient's risk for falls and implement fall prevention plan of care per policy   Ensure appropriate safety devices are available at the bedside   Provide and maintain safe environment   Use appropriate transfer methods   Include patient/ family/ care giver in decisions related to safety   Hourly rounding   Assess for patients risk for elopement and implement Elopement Risk Plan per policy  Goal: Patient will be free from infection during hospitalization  Outcome: Progressing  Flowsheets (Taken 08/07/2022 1606)  Free from Infection during hospitalization:   Assess and monitor for signs and symptoms of infection   Monitor lab/diagnostic results   Monitor all insertion sites (i.e. indwelling lines, tubes, urinary catheters, and drains)   Encourage patient and family to use good hand hygiene technique     Problem: Diabetes: Glucose Imbalance  Goal: Blood glucose stable at established goal  Outcome: Progressing  Flowsheets (Taken 08/07/2022 1606)  Blood glucose stable at established goal:   Monitor lab values   Monitor intake and output.  Notify LIP if urine output is < 30 mL/hour.   Include patient/family in decisions related to nutrition/dietary selections   Follow fluid restrictions/IV/PO parameters   Assess for hypoglycemia /hyperglycemia   Monitor/assess vital signs   Ensure appropriate diet and assess tolerance   Coordinate medication administration with meals, as indicated   Ensure adequate hydration     Problem: Hemodynamic Status: Cardiac  Goal: Stable vital signs and fluid balance  Outcome: Progressing  Flowsheets (Taken 08/07/2022 1606)  Stable vital signs and fluid  balance:   Assess signs and symptoms associated with cardiac rhythm changes   Monitor lab values

## 2022-08-07 NOTE — Plan of Care (Incomplete)
NURSING SHIFT NOTE     Patient: Meredith Baker  Day: 14      SHIFT EVENTS     Shift Narrative/Significant Events (PRN med administration, fall, RRT, etc.):     Assumed pt responsibility, axo 2-3, pt on room air, reported some stomach pain, comfort measures given to ease pain, pt sleeping and no new complaints of stomach pain. Scheduled medications given as ordered, comfort measures provided to pt. No changes to report on pts condition.     Safety and fall precautions remain in place. Purposeful rounding completed.          ASSESSMENT     Changes in assessment from patient's baseline this shift:    Neuro: {yes:21351::"No"}  CV: {yes:21351::"No"}  Pulm: {yes:21351::"No"}  Peripheral Vascular: {yes:21351::"No"}  HEENT: {yes:21351::"No"}  GI: {yes:21351::"No"}  BM during shift: {yes:21351::"No"}, Last BM: Last BM Date: 08/06/22  GU: {yes:21351::"No"}   Integ: {yes:21351::"No"}  MS: {yes:21351::"No"}    Pain: {Pain at site:39313::"None"}  Pain Interventions: {RELIEVING FACTORS:28901}  Medications Utilized: {Meds; pain:60255}    Mobility: PMP Activity: Step 3 - Bed Mobility of Distance Walked (ft) (Step 6,7): 0 Feet           Lines     Patient Lines/Drains/Airways Status       Active Lines, Drains and Airways       Name Placement date Placement time Site Days    Peripheral IV 07/24/22 20 G Anterior;Left Forearm 07/24/22  0030  Forearm  14    External Urinary Catheter 07/23/22  1602  --  15                         VITAL SIGNS     Vitals:    08/07/22 2140   BP: 123/71   Pulse: (!) 102   Resp:    Temp:    SpO2: 98%       Temp  Min: 98.1 F (36.7 C)  Max: 98.2 F (36.8 C)  Pulse  Min: 79  Max: 107  Resp  Min: 14  Max: 18  BP  Min: 96/57  Max: 127/76  SpO2  Min: 97 %  Max: 99 %    No intake or output data in the 24 hours ending 08/07/22 2325           CARE PLAN        Problem: Pain interferes with ability to perform ADL  Goal: Pain at adequate level as identified by patient  Outcome: Progressing  Flowsheets (Taken  08/07/2022 1606 by Harrold Donath, RN)  Pain at adequate level as identified by patient:   Identify patient comfort function goal   Assess for risk of opioid induced respiratory depression, including snoring/sleep apnea. Alert healthcare team of risk factors identified.   Assess pain on admission, during daily assessment and/or before any "as needed" intervention(s)   Evaluate patient's satisfaction with pain management progress   Reassess pain within 30-60 minutes of any procedure/intervention, per Pain Assessment, Intervention, Reassessment (AIR) Cycle   Evaluate if patient comfort function goal is met   Offer non-pharmacological pain management interventions     Problem: Side Effects from Pain Analgesia  Goal: Patient will experience minimal side effects of analgesic therapy  Outcome: Progressing  Flowsheets (Taken 08/02/2022 0534 by Derrill Memo, RN)  Patient will experience minimal side effects of analgesic therapy: Monitor/assess patient's respiratory status (RR depth, effort, breath sounds)     Problem: Moderate/High Fall Risk Score >5  Goal: Patient will remain free of falls  Outcome: Progressing  Flowsheets  Taken 08/07/2022 2200 by Evalina Field, RN  High (Greater than 13):   HIGH-Consider use of low bed   HIGH-Initiate use of floor mats as appropriate   HIGH-Apply yellow "Fall Risk" arm band   HIGH-Utilize chair pad alarm for patient while in the chair   HIGH-Bed alarm on at all times while patient in bed   HIGH-Visual cue at entrance to patient's room  Taken 08/07/2022 1606 by Rummel, Sunny Schlein, RN  VH High Risk (Greater than 13):   ALL REQUIRED LOW INTERVENTIONS   ALL REQUIRED MODERATE INTERVENTIONS   A CHAIR PAD ALARM WILL BE USED WHEN PATIENT IS UP SITTING IN A CHAIR   Keep door open for better visibility   Use assistive devices   Use chair-pad alarm device   Use of floor mat  Taken 08/02/2022 1600 by Delice Bison, RN  Moderate Risk (6-13):   MOD- Consider video monitoring    MOD-Use gait belt when appropriate   MOD-Perform dangle, stand, walk (DSW) prior to mobilization   MOD-Utilize diversion activities   MOD-Re-orient confused patients   MOD-Floor mat at bedside (where available) if appropriate   MOD-Apply bed exit alarm if patient is confused     Problem: Compromised Tissue integrity  Goal: Damaged tissue is healing and protected  Outcome: Progressing  Flowsheets (Taken 08/07/2022 1606 by Harrold Donath, RN)  Damaged tissue is healing and protected:   Monitor/assess Braden scale every shift   Reposition patient every 2 hours and as needed unless able to reposition self   Provide wound care per wound care algorithm   Increase activity as tolerated/progressive mobility   Relieve pressure to bony prominences for patients at moderate and high risk   Avoid shearing injuries   Keep intact skin clean and dry   Use bath wipes, not soap and water, for daily bathing   Use incontinence wipes for cleaning urine, stool and caustic drainage. Foley care as needed   Encourage use of lotion/moisturizer on skin   Monitor external devices/tubes for correct placement to prevent pressure, friction and shearing   Monitor patient's hygiene practices  Goal: Nutritional status is improving  Outcome: Progressing  Flowsheets (Taken 08/07/2022 1606 by Harrold Donath, RN)  Nutritional status is improving:   Assist patient with eating   Allow adequate time for meals   Encourage patient to take dietary supplement(s) as ordered     Problem: Safety  Goal: Patient will be free from injury during hospitalization  Outcome: Progressing  Flowsheets (Taken 08/07/2022 1606 by Harrold Donath, RN)  Patient will be free from injury during hospitalization:   Assess patient's risk for falls and implement fall prevention plan of care per policy   Ensure appropriate safety devices are available at the bedside   Provide and maintain safe environment   Use appropriate transfer methods   Include patient/  family/ care giver in decisions related to safety   Hourly rounding   Assess for patients risk for elopement and implement Elopement Risk Plan per policy  Goal: Patient will be free from infection during hospitalization  Outcome: Progressing  Flowsheets (Taken 08/07/2022 1606 by Harrold Donath, RN)  Free from Infection during hospitalization:   Assess and monitor for signs and symptoms of infection   Monitor lab/diagnostic results   Monitor all insertion sites (i.e. indwelling lines, tubes, urinary catheters, and drains)   Encourage patient and family to use  good hand hygiene technique     Problem: Fluid and Electrolyte Imbalance/ Endocrine  Goal: Fluid and electrolyte balance are achieved/maintained  Outcome: Progressing  Flowsheets (Taken 08/01/2022 1833 by Delice Bison, RN)  Fluid and electrolyte balance are achieved/maintained:   Monitor/assess lab values and report abnormal values   Monitor for muscle weakness   Assess and reassess fluid and electrolyte status   Observe for cardiac arrhythmias  Goal: Adequate hydration  Outcome: Progressing  Flowsheets (Taken 08/02/2022 1843 by Delice Bison, RN)  Adequate hydration:   Monitor and assess vital signs and perfusion   Assess for peripheral, sacral, periorbital and abdominal edema   Assess mucus membranes, skin color, turgor, perfusion and presence of edema     Problem: Hemodynamic Status: Cardiac  Goal: Stable vital signs and fluid balance  Outcome: Progressing  Flowsheets (Taken 08/07/2022 1606 by Harrold Donath, RN)  Stable vital signs and fluid balance:   Assess signs and symptoms associated with cardiac rhythm changes   Monitor lab values     Problem: Bladder/Voiding  Goal: Patient will experience proper bladder emptying during admission and remain free from infection  Outcome: Progressing  Flowsheets (Taken 08/06/2022 1358 by Reunion, RN)  Patient will experience proper bladder emptying during admission and remain free from  infection: Apply urinary containment device as appropriate and/or per order     Problem: Diabetes: Glucose Imbalance  Goal: Blood glucose stable at established goal  Outcome: Progressing  Flowsheets (Taken 08/07/2022 1606 by Harrold Donath, RN)  Blood glucose stable at established goal:   Monitor lab values   Monitor intake and output.  Notify LIP if urine output is < 30 mL/hour.   Include patient/family in decisions related to nutrition/dietary selections   Follow fluid restrictions/IV/PO parameters   Assess for hypoglycemia /hyperglycemia   Monitor/assess vital signs   Ensure appropriate diet and assess tolerance   Coordinate medication administration with meals, as indicated   Ensure adequate hydration

## 2022-08-07 NOTE — Discharge Summary (Signed)
[facility]      Patient: Meredith Baker  Admission Date: 07/23/2022   DOB: [birth year]  Discharge Date: 08/07/2022    MRN: [id number]  Discharge Attending: Vernona Rieger, MD     Referring Physician: Pcp, None, MD  PCP: Pcp, None, MD       DISCHARGE SUMMARY     Discharge Information   Admission Diagnosis:   NSTEMI (non-ST elevated myocardial infarction)    Discharge Diagnosis:   Active Hospital Problems    Diagnosis    NSTEMI (non-ST elevated myocardial infarction)        Admission Condition: Guarded  Discharge Condition: Stable and improved  Consultants: Cardiology/pathology and gastroenterology  Functional Status: Bedbound  Discharged to: SNF  08-03-22: Multidisciplinary conversation involving myself, case management, nurse, patient, we spoke on the speakerphone with the contact at the nursing home to try to address any deficiencies in the patient's application so that her skilled nursing facility placement could be facilitated.  Greatly appreciate case management's assistance  08-04-22: Patient with intermittent delirium.  Thought that she went to a party last night.  Remains pleasant.  Attempted to redirect.  Still working on [facility]  08-05-22 still working on discharge snf appreciate case management help  12-9 still disorented thinks its 1984  12-10 today she thinks it is 76.  She is alert and oriented to herself and location.  Still working on placement.     Hospital Course   Presentation History   76 years old female with history of hypertension, type 2 diabetes, hyperlipidemia, osteoarthritis, bedbound, history of bilateral DVT who presented for evaluation of generalized weakness presented for weakness and inability to care for herself admitted with a diagnosis of UTI and NSTEMI cardiology on board, Noted trended down H/H.    See HPI for details.    Hospital Course (14 Days)     Anemia  Hemoglobin has been dropping from  From 12-10--8.2  I have ordered H&H monitor every 6 hours  Fecal occult blood  test  Given patient has history of DVT I will continue the anticoagulation for now  Fecal occult blood test  Will monitor closely  No obvious bleeding noted  11/29: So far H&H stable between 8-9, continue heparin drip, pending hematology evaluation) final recommendation, fecal occult blood test pending, patient had 1 bowel movement yesterday which is brown, no obvious bleeding noted  11/30: H&H stayed stable, patient switched back to Pradaxa per heme-onc recommendation, fecal occult blood test came positive, GI consulted to weigh in, started on PPI  12/1-12/5: Discussed with GI, recommended outpatient EGD colonoscopy and Protonix 40 mg twice daily for a month followed by daily, H&H remained stable in fact uptrending plan communicated with the patient as well as niece    Sepsis likely from UTI  Presented with leukocytosis and tachycardia  CT scan of the chest showed small left pleural effusion and evidence of pulmonary hypertension  UA consistent with UTI  Blood culture and urine culture came negative  Initially started on empirical broad-spectrum antibiotic vancomycin and Zosyn  MRSA test is negative  CT abdomen showed nonobstructive stone and small bladder stone  Antibiotic de-escalated to ceftriaxone  Leukocytosis improved  11/29-11/30: Patient will complete 5 days course of antibiotics today        History of DVT  Patient had bilateral lower extremity DVT back in September 2023 currently on Pradaxa  Repeat venous ultrasound showed left femoral popliteal DVT extending to the left peroneal vein and posterior tibialis vein with associated mild  soft tissue swelling/edema and short segment nonocclusive thrombus within the right common femoral vein  I will involve hematology to guide on treatment  11/29: CTA ruled out PE, for now we will continue heparin drip, will switch her back to DOAC once hematology clears  11/30: Patient is a switch to Pradaxa after hematology cleared the patient  12/1-12/5: Patient tolerated  Pradaxa, outpatient follow-up with hematology    NSTEMI  Elevated troponin  Likely demand ischemia  Troponin trended up from 90-108  Patient is on aspirin, statin and Pradaxa  Cardiology on board, recommended to get CTA  Follow-up recommendation  11/29-12/5: 2D echo showed normal EF, no LV wall motion abnormality, elevated troponin is likely from demand ischemia and anemia, continue aspirin/statin/Coreg/losartan.  Outpatient follow-up with cardiology        Chronic stable medical condition  Hypertension: Continue amlodipine/metoprolol/losartan  11/29-12/5: Blood pressure within acceptable range, outpatient follow-up with PCP     Type 2 diabetes  Carb controlled diet  Sliding scale  Hypoglycemia protocol  12/1-12/5: Increase Lantus to 10, continue sliding scale, and carb controlled diet.  12/5: Blood sugar low 200s, I have increased the Lantus to 12, continue to monitor POC    Morbid obesity  Outpatient follow-up     Bedbound  Wound on her left iliac area  Wound nurse consult       Procedures/Imaging:   Upon my review: Negative except discussed above         Progress Note/Physical Exam at Discharge     Subjective:Patient is stable for discharge.    Vitals:    08/07/22 0347 08/07/22 1022 08/07/22 1230 08/07/22 1529   BP: 1'14/72 96/57 98/58 '$ 107/62   Pulse: 91  79 94   Resp: '17  17 14   '$ Temp: 98.1 F (36.7 C)   98.1 F (36.7 C)   TempSrc:    Oral   SpO2: 98%  98% 97%   Weight:       Height:               General: Chronically sick looking, NAD, AAOx1  HEENT: Sclera anicteric, no conjunctival injection, OP: Clear, MMM  Neck: Supple, FROM, no LAD  Cardiovascular: RRR, no m/r/g  Lungs: CTAB, no w/r/r  Abdomen: Soft, +BS, NT/ND, no masses, no g/r  Extremities: No C/C/E  Skin: No rashes or lesions noted  Neuro: Weak all over but able to follow commands and move all extremities       Diagnostics     Labs/Studies Pending at Discharge: Yes outpatient EGD and colonoscopy    Last Labs   Recent Labs   Lab 08/07/22  0351  08/05/22  0253 08/03/22  0422   WBC 9.09 9.67* 8.89   RBC 2.49* 2.56* 2.57*   Hgb 8.0* 8.3* 8.2*   Hematocrit 25.3* 25.6* 26.0*   MCV 101.6* 100.0* 101.2*   Platelets 356* 419* 442*         Recent Labs   Lab 08/07/22  1146 08/04/22  1036   Sodium 140 141   Potassium 4.1 4.4   Chloride 103 104   CO2 28 29   BUN 26.0* 19.0   Creatinine 0.8 0.7   Glucose 160* 191*   Calcium 8.6 9.1         Microbiology Results (last 15 days)       Procedure Component Value Units Date/Time    MRSA culture [id number] Collected: 07/24/22 1932    Order Status: Completed Specimen: Culturette from Nasal  Swab Updated: 07/26/22 0214     Culture MRSA Surveillance Negative for Methicillin Resistant Staph aureus    MRSA culture [id number] Collected: 07/24/22 1932    Order Status: Completed Specimen: Culturette from Throat Updated: 07/26/22 0214     Culture MRSA Surveillance Negative for Methicillin Resistant Staph aureus    Urine culture [id number] Collected: 07/24/22 1932    Order Status: Completed Specimen: Bladder Updated: 07/26/22 0413    Narrative:      ORDER#: [id number]                                    ORDERED BY: Enis Gash  SOURCE: Urine                                        COLLECTED:  07/24/22 19:32  ANTIBIOTICS AT COLL.:                                RECEIVED :  07/24/22 20:10  Culture Urine                              FINAL       07/26/22 04:13  07/26/22   No growth of >1,000 CFU/ML, No further work      Culture Blood Aerobic and Anaerobic [id number] Collected: 07/23/22 2218    Order Status: Completed Specimen: Blood, Venipuncture Updated: 07/29/22 0421    Narrative:      The order will result in two separate 8-30m bottles  Please do NOT order repeat blood cultures if one has been  drawn within the last 48 hours  UNLESS concerned for  endocarditis  AVOID BLOOD CULTURE DRAWS FROM CENTRAL LINE IF POSSIBLE  Indications:->Sepsis  ORDER#: [id number]                                   ORDERED BY: BEnis Gash SOURCE: Blood,  Venipuncture                          COLLECTED:  07/23/22 22:18  ANTIBIOTICS AT COLL.:                                RECEIVED :  07/24/22 01:42  Culture Blood Aerobic and Anaerobic        FINAL       07/29/22 04:21  07/29/22   No growth after 5 days of incubation.      Culture Blood Aerobic and Anaerobic [[id number]: 07/23/22 2218    Order Status: Completed Specimen: Blood, Venipuncture Updated: 07/29/22 0421    Narrative:      The order will result in two separate 8-122mbottles  Please do NOT order repeat blood cultures if one has been  drawn within the last 48 hours  UNLESS concerned for  endocarditis  AVOID BLOOD CULTURE DRAWS FROM CENTRAL LINE IF POSSIBLE  Indications:->Sepsis  ORDER#: [id number]  ORDERED BY: BRENNAN, IRINA  SOURCE: Blood, Venipuncture                          COLLECTED:  07/23/22 22:18  ANTIBIOTICS AT COLL.:                                RECEIVED :  07/24/22 01:42  Culture Blood Aerobic and Anaerobic        FINAL       07/29/22 04:21  07/29/22   No growth after 5 days of incubation.      COVID-19 (SARS-CoV-2) and Influenza A/B, NAA (Liat Rapid) [id number] Collected: 07/23/22 2218    Order Status: Completed Specimen: Culturette from Nasopharyngeal Updated: 07/23/22 2259     Purpose of COVID testing Diagnostic -PUI     SARS-CoV-2 Specimen Source Nasal Swab     SARS CoV 2 Overall Result Not Detected     Comment: __________________________________________________  -A result of "Detected" indicates POSITIVE for the    presence of SARS CoV-2 RNA  -A result of "Not Detected" indicates NEGATIVE for the    presence of SARS CoV-2 RNA  __________________________________________________________  Test performed using the Roche cobas Liat SARS-CoV-2 assay. This assay is  only for use under the Food and Drug Administration's Emergency Use  Authorization. This is a real-time RT-PCR assay for the qualitative  detection of SARS-CoV-2 RNA. Viral nucleic acids may  persist in vivo,  independent of viability. Detection of viral nucleic acid does not imply the  presence of infectious virus, or that virus nucleic acid is the cause of  clinical symptoms. Negative results do not preclude SARS-CoV-2 infection and  should not be used as the sole basis for diagnosis, treatment or other  patient management decisions. Negative results must be combined with  clinical observations, patient history, and/or epidemiological information.  Invalid results may be due to inhibiting substances in the specimen and  recollection should occur. Please see Fact Sheets for patients and providers  located:  https://www.benson-chung.com/          Influenza A Not Detected     Influenza B Not Detected     Comment: Test performed using the Roche cobas Liat SARS-CoV-2 & Influenza A/B assay.  This assay is only for use under the Food and Drug Administration's  Emergency Use Authorization. This is a multiplex real-time RT-PCR assay  intended for the simultaneous in vitro qualitative detection and  differentiation of SARS-CoV-2, influenza A, and influenza B virus RNA. Viral  nucleic acids may persist in vivo, independent of viability. Detection of  viral nucleic acid does not imply the presence of infectious virus, or that  virus nucleic acid is the cause of clinical symptoms. Negative results do  not preclude SARS-CoV-2, influenza A, and/or influenza B infection and  should not be used as the sole basis for diagnosis, treatment or other  patient management decisions. Negative results must be combined with  clinical observations, patient history, and/or epidemiological information.  Invalid results may be due to inhibiting substances in the specimen and  recollection should occur. Please see Fact Sheets for patients and providers  located: http://olson-hall.info/.         Narrative:      o Collect and clearly label specimen type:  o PREFERRED-Upper respiratory specimen: One Nasal  Swab in  Transport Media.  o Hand deliver to laboratory ASAP  Diagnostic -PUI  Patient Instructions   Discharge Diet: Heart healthy diet  Discharge Activity: As tolerated    Follow Up Appointment:   Follow-up Information       West Carbo, MD Follow up in 1 week(s).    Specialties: Gastroenterology, Internal Medicine  Contact information:  [place] [address]  [phone]               Tia Alert, MD Follow up in 1 week(s).    Specialties: Medical Oncology, Hematology  Contact information:  [address]  [place] [address]  [phone]               Vonzell Schlatter, MD Follow up in 1 week(s).    Specialties: Interventional Cardiology, Internal Medicine, Cardiology  Contact information:  [address]  [place] [address]  [phone]               Pcp, None, MD .                              Discharge Medications:     Medication List        START taking these medications      aspirin 81 MG chewable tablet  Chew 1 tablet (81 mg) by mouth daily     atorvastatin 40 MG tablet  Commonly known as: LIPITOR  Take 1 tablet (40 mg) by mouth nightly     carboxymethylcellulose 0.5 % ophthalmic solution  Commonly known as: REFRESH TEARS  Place 1 drop into both eyes 3 (three) times daily as needed (dry eye)     insulin glargine 100 UNIT/ML injection  Commonly known as: LANTUS  Inject 12 Units into the skin nightly     pantoprazole 40 MG tablet  Commonly known as: PROTONIX  Take 1 tablet (40 mg) by mouth 2 (two) times daily for 30 days, THEN 1 tablet (40 mg) daily.  Start taking on: July 30, 2022            CONTINUE taking these medications      acetaminophen 500 MG tablet  Commonly known as: TYLENOL     CENTRUM SILVER 50+WOMEN PO     dabigatran 150 MG Caps  Commonly known as: PRADAXA     losartan 100 MG TABS, hydroCHLOROthiazide 25 MG TABS     metoprolol succinate 200 MG 24 hr tablet  Commonly known as: TOPROL-XL     TURMERIC PO     valACYclovir 1000 MG  tablet  Commonly known as: VALTREX     vitamin D (ergocalciferol) 50000 UNIT Caps  Commonly known as: DRISDOL            STOP taking these medications      amLODIPine 5 MG tablet  Commonly known as: NORVASC     HUMULIN 70/30 KWIKPEN SC               Where to Get Your Medications        These medications were sent to [place], [place] - [address]  [address] [place] [address]      Phone: [phone]   aspirin 81 MG chewable tablet  atorvastatin 40 MG tablet  pantoprazole 40 MG tablet       Information about where to get these medications is not yet available    Ask your nurse  or doctor about these medications  carboxymethylcellulose 0.5 % ophthalmic solution  insulin glargine 100 UNIT/ML injection          Time spent examining patient, discussing with patient/family regarding hospital course, chart review, reconciling medications and discharge planning: 31 minutes.    Signed,  Danelle Earthly, MD    6:25 PM 08/07/2022

## 2022-08-08 LAB — GLUCOSE WHOLE BLOOD - POCT
Whole Blood Glucose POCT: 219 mg/dL — ABNORMAL HIGH (ref 70–100)
Whole Blood Glucose POCT: 286 mg/dL — ABNORMAL HIGH (ref 70–100)
Whole Blood Glucose POCT: 320 mg/dL — ABNORMAL HIGH (ref 70–100)
Whole Blood Glucose POCT: 345 mg/dL — ABNORMAL HIGH (ref 70–100)

## 2022-08-08 NOTE — Plan of Care (Signed)
NURSING SHIFT NOTE     Patient: Meredith Baker  Day: 15  SHIFT EVENTS   Patient remain stable with no respiratory distress noted, assisted with cares and needs. Wound care done. Due meds given and well tolerated. Safety and fall precautions remain in place. Purposeful rounding completed. Will continue with the plan of care.       ASSESSMENT     Changes in assessment from patient's baseline this shift:  Neuro: No  CV: No  Pulm: No  Peripheral Vascular: No  HEENT: No  GI: No  BM during shift: No, Last BM: Last BM Date: 08/08/22  GU: No   Integ: No  MS: No  Pain: improved  Pain Interventions: Rest and Positioning, medication  Medications Utilized: Gabapentin oral routine  Mobility: PMP Activity: Step 4 - Dangle at Bedside of Distance Walked (ft) (Step 6,7): 0 Feet       Lines     Patient Lines/Drains/Airways Status       Active Lines, Drains and Airways       Name Placement date Placement time Site Days    Peripheral IV 07/24/22 20 G Anterior;Left Forearm 07/24/22  0030  Forearm  15    External Urinary Catheter 07/23/22  1602  --  16                     VITAL SIGNS     Vitals:    08/08/22 1549   BP: 126/69   Pulse: 96   Resp: 16   Temp: 97.8 F (36.6 C)   SpO2: 100%     Temp  Min: 97.8 F (36.6 C)  Max: 98.6 F (37 C)  Pulse  Min: 89  Max: 102  Resp  Min: 16  Max: 17  BP  Min: 114/67  Max: 135/78  SpO2  Min: 96 %  Max: 100 %  Intake/Output Summary (Last 24 hours) at 08/08/2022 2014  Last data filed at 08/08/2022 1549  Gross per 24 hour   Intake 150 ml   Output --   Net 150 ml        CARE PLAN     Problem: Pain interferes with ability to perform ADL  Goal: Pain at adequate level as identified by patient  Outcome: Progressing  Flowsheets (Taken 08/08/2022 1200)  Pain at adequate level as identified by patient:   Identify patient comfort function goal   Assess for risk of opioid induced respiratory depression, including snoring/sleep apnea. Alert healthcare team of risk factors identified.   Assess pain on admission,  during daily assessment and/or before any "as needed" intervention(s)   Reassess pain within 30-60 minutes of any procedure/intervention, per Pain Assessment, Intervention, Reassessment (AIR) Cycle   Evaluate if patient comfort function goal is met   Evaluate patient's satisfaction with pain management progress   Offer non-pharmacological pain management interventions     Problem: Side Effects from Pain Analgesia  Goal: Patient will experience minimal side effects of analgesic therapy  Outcome: Progressing  Flowsheets (Taken 08/08/2022 1200)  Patient will experience minimal side effects of analgesic therapy:   Monitor/assess patient's respiratory status (RR depth, effort, breath sounds)   Assess for changes in cognitive function   Prevent/manage side effects per LIP orders (i.e. nausea, vomiting, pruritus, constipation, urinary retention, etc.)   Evaluate for opioid-induced sedation with appropriate assessment tool (i.e. POSS)     Problem: Moderate/High Fall Risk Score >5  Goal: Patient will remain free of falls  Outcome: Progressing  Flowsheets (Taken 08/08/2022 1000)  Moderate Risk (6-13): MOD-Consider a move closer to Nurses Station     Problem: Compromised Tissue integrity  Goal: Damaged tissue is healing and protected  Outcome: Progressing  Flowsheets (Taken 08/08/2022 0800)  Damaged tissue is healing and protected:   Monitor/assess Braden scale every shift   Provide wound care per wound care algorithm   Reposition patient every 2 hours and as needed unless able to reposition self   Increase activity as tolerated/progressive mobility   Relieve pressure to bony prominences for patients at moderate and high risk   Keep intact skin clean and dry   Avoid shearing injuries   Use bath wipes, not soap and water, for daily bathing   Use incontinence wipes for cleaning urine, stool and caustic drainage. Foley care as needed  Goal: Nutritional status is improving  Outcome: Progressing  Flowsheets (Taken 08/08/2022  0800)  Nutritional status is improving:   Assist patient with eating   Allow adequate time for meals   Encourage patient to take dietary supplement(s) as ordered   Collaborate with Clinical Nutritionist   Include patient/patient care companion in decisions related to nutrition     Problem: Safety  Goal: Patient will be free from injury during hospitalization  Outcome: Progressing  Flowsheets (Taken 08/08/2022 1650)  Patient will be free from injury during hospitalization:   Assess patient's risk for falls and implement fall prevention plan of care per policy   Provide and maintain safe environment   Use appropriate transfer methods   Ensure appropriate safety devices are available at the bedside   Include patient/ family/ care giver in decisions related to safety   Assess for patients risk for elopement and implement Elopement Risk Plan per policy  Goal: Patient will be free from infection during hospitalization  Outcome: Progressing  Flowsheets (Taken 08/08/2022 1650)  Free from Infection during hospitalization:   Assess and monitor for signs and symptoms of infection   Monitor lab/diagnostic results   Encourage patient and family to use good hand hygiene technique   Monitor all insertion sites (i.e. indwelling lines, tubes, urinary catheters, and drains)     Problem: Fluid and Electrolyte Imbalance/ Endocrine  Goal: Fluid and electrolyte balance are achieved/maintained  Outcome: Progressing  Flowsheets (Taken 08/08/2022 1650)  Fluid and electrolyte balance are achieved/maintained:   Monitor/assess lab values and report abnormal values   Assess and reassess fluid and electrolyte status   Observe for cardiac arrhythmias   Monitor for muscle weakness  Goal: Adequate hydration  Outcome: Progressing  Flowsheets (Taken 08/08/2022 1650)  Adequate hydration:   Assess mucus membranes, skin color, turgor, perfusion and presence of edema   Monitor and assess vital signs and perfusion   Assess for peripheral, sacral,  periorbital and abdominal edema     Problem: Hemodynamic Status: Cardiac  Goal: Stable vital signs and fluid balance  Outcome: Progressing  Flowsheets (Taken 08/08/2022 1650)  Stable vital signs and fluid balance:   Assess signs and symptoms associated with cardiac rhythm changes   Monitor lab values     Problem: Bladder/Voiding  Goal: Patient will experience proper bladder emptying during admission and remain free from infection  Outcome: Progressing  Flowsheets (Taken 08/08/2022 1650)  Patient will experience proper bladder emptying during admission and remain free from infection: Encourage patient to identify medications that aid bladder function prior to administration     Problem: Diabetes: Glucose Imbalance  Goal: Blood glucose stable at established goal  Outcome: Progressing  Flowsheets (Taken  08/08/2022 1650)  Blood glucose stable at established goal:   Monitor lab values   Assess for hypoglycemia /hyperglycemia   Monitor/assess vital signs   Monitor intake and output.  Notify LIP if urine output is < 30 mL/hour.   Ensure adequate hydration   Include patient/family in decisions related to nutrition/dietary selections

## 2022-08-08 NOTE — Plan of Care (Signed)
Problem: Pain interferes with ability to perform ADL  Goal: Pain at adequate level as identified by patient  Outcome: Progressing  Flowsheets (Taken 08/08/2022 1200 by Velva Harman, RN)  Pain at adequate level as identified by patient:   Identify patient comfort function goal   Assess for risk of opioid induced respiratory depression, including snoring/sleep apnea. Alert healthcare team of risk factors identified.   Assess pain on admission, during daily assessment and/or before any "as needed" intervention(s)   Reassess pain within 30-60 minutes of any procedure/intervention, per Pain Assessment, Intervention, Reassessment (AIR) Cycle   Evaluate if patient comfort function goal is met   Evaluate patient's satisfaction with pain management progress   Offer non-pharmacological pain management interventions     Problem: Side Effects from Pain Analgesia  Goal: Patient will experience minimal side effects of analgesic therapy  Outcome: Progressing  Flowsheets (Taken 08/08/2022 1200 by Velva Harman, RN)  Patient will experience minimal side effects of analgesic therapy:   Monitor/assess patient's respiratory status (RR depth, effort, breath sounds)   Assess for changes in cognitive function   Prevent/manage side effects per LIP orders (i.e. nausea, vomiting, pruritus, constipation, urinary retention, etc.)   Evaluate for opioid-induced sedation with appropriate assessment tool (i.e. POSS)     Problem: Moderate/High Fall Risk Score >5  Goal: Patient will remain free of falls  Outcome: Progressing  Flowsheets  Taken 08/08/2022 2100 by Barbra Sarks, RN  High (Greater than 13):   HIGH-Consider use of low bed   HIGH-Initiate use of floor mats as appropriate   HIGH-Pharmacy to initiate evaluation and intervention per protocol   HIGH-Apply yellow "Fall Risk" arm band   HIGH-Utilize chair pad alarm for patient while in the chair   HIGH-Bed alarm on at all times while patient in bed   HIGH-Visual cue at entrance to  patient's room   LOW-Fall Interventions Appropriate for Low Fall Risk   MOD-Re-orient confused patients  Taken 08/08/2022 1000 by Velva Harman, RN  Moderate Risk (6-13): MOD-Consider a move closer to [place] 08/07/2022 1606 by Harrold Donath, RN  VH High Risk (Greater than 13):   ALL REQUIRED LOW INTERVENTIONS   ALL REQUIRED MODERATE INTERVENTIONS   A CHAIR PAD ALARM WILL BE USED WHEN PATIENT IS UP SITTING IN A CHAIR   Keep door open for better visibility   Use assistive devices   Use chair-pad alarm device   Use of floor mat     Problem: Compromised Tissue integrity  Goal: Damaged tissue is healing and protected  Outcome: Progressing  Flowsheets (Taken 08/08/2022 1600 by Velva Harman, RN)  Damaged tissue is healing and protected:   Monitor/assess Braden scale every shift   Reposition patient every 2 hours and as needed unless able to reposition self   Increase activity as tolerated/progressive mobility   Relieve pressure to bony prominences for patients at moderate and high risk   Avoid shearing injuries   Keep intact skin clean and dry   Use bath wipes, not soap and water, for daily bathing   Use incontinence wipes for cleaning urine, stool and caustic drainage. Foley care as needed   Monitor external devices/tubes for correct placement to prevent pressure, friction and shearing  Goal: Nutritional status is improving  Outcome: Progressing  Flowsheets (Taken 08/08/2022 1600 by Velva Harman, RN)  Nutritional status is improving:   Allow adequate time for meals   Encourage patient to take dietary supplement(s) as ordered     Problem: Safety  Goal: Patient will be free from injury during hospitalization  Outcome: Progressing  Flowsheets (Taken 08/08/2022 1650 by Velva Harman, RN)  Patient will be free from injury during hospitalization:   Assess patient's risk for falls and implement fall prevention plan of care per policy   Provide and maintain safe environment   Use appropriate  transfer methods   Ensure appropriate safety devices are available at the bedside   Include patient/ family/ care giver in decisions related to safety   Assess for patients risk for elopement and implement Elopement Risk Plan per policy  Goal: Patient will be free from infection during hospitalization  Outcome: Progressing  Flowsheets (Taken 08/08/2022 1650 by Velva Harman, RN)  Free from Infection during hospitalization:   Assess and monitor for signs and symptoms of infection   Monitor lab/diagnostic results   Encourage patient and family to use good hand hygiene technique   Monitor all insertion sites (i.e. indwelling lines, tubes, urinary catheters, and drains)     Problem: Fluid and Electrolyte Imbalance/ Endocrine  Goal: Fluid and electrolyte balance are achieved/maintained  Outcome: Progressing  Flowsheets (Taken 08/08/2022 1650 by Velva Harman, RN)  Fluid and electrolyte balance are achieved/maintained:   Monitor/assess lab values and report abnormal values   Assess and reassess fluid and electrolyte status   Observe for cardiac arrhythmias   Monitor for muscle weakness  Goal: Adequate hydration  Outcome: Progressing  Flowsheets (Taken 08/08/2022 1650 by Velva Harman, RN)  Adequate hydration:   Assess mucus membranes, skin color, turgor, perfusion and presence of edema   Monitor and assess vital signs and perfusion   Assess for peripheral, sacral, periorbital and abdominal edema     Problem: Hemodynamic Status: Cardiac  Goal: Stable vital signs and fluid balance  Outcome: Progressing  Flowsheets (Taken 08/08/2022 1650 by Velva Harman, RN)  Stable vital signs and fluid balance:   Assess signs and symptoms associated with cardiac rhythm changes   Monitor lab values     Problem: Bladder/Voiding  Goal: Patient will experience proper bladder emptying during admission and remain free from infection  Outcome: Progressing  Flowsheets (Taken 08/08/2022 1650 by Velva Harman, RN)  Patient will  experience proper bladder emptying during admission and remain free from infection: Encourage patient to identify medications that aid bladder function prior to administration     Problem: Diabetes: Glucose Imbalance  Goal: Blood glucose stable at established goal  Outcome: Progressing  Flowsheets (Taken 08/08/2022 1650 by Velva Harman, RN)  Blood glucose stable at established goal:   Monitor lab values   Assess for hypoglycemia /hyperglycemia   Monitor/assess vital signs   Monitor intake and output.  Notify LIP if urine output is < 30 mL/hour.   Ensure adequate hydration   Include patient/family in decisions related to nutrition/dietary selections

## 2022-08-08 NOTE — Discharge Summary (Signed)
[facility]      Patient: Meredith Baker  Admission Date: 07/23/2022   DOB: Sep 09, 1945  Discharge Date: 08/08/2022    MRN: [id number]  Discharge Attending: Vernona Rieger, MD     Referring Physician: Pcp, None, MD  PCP: Pcp, None, MD       DISCHARGE SUMMARY     Discharge Information   Admission Diagnosis:   NSTEMI (non-ST elevated myocardial infarction)    Discharge Diagnosis:   Active Hospital Problems    Diagnosis    NSTEMI (non-ST elevated myocardial infarction)        Admission Condition: Guarded  Discharge Condition: Stable and improved  Consultants: Cardiology/pathology and gastroenterology  Functional Status: Bedbound  Discharged to: SNF  08-03-22: Multidisciplinary conversation involving myself, case management, nurse, patient, we spoke on the speakerphone with the contact at the nursing home to try to address any deficiencies in the patient's application so that her skilled nursing facility placement could be facilitated.  Greatly appreciate case management's assistance  08-04-22: Patient with intermittent delirium.  Thought that she went to a party last night.  Remains pleasant.  Attempted to redirect.  Still working on [facility]  08-05-22 still working on discharge snf appreciate case management help  12-9 still disorented thinks its 1984  12-10 today she thinks it is 73.  She is alert and oriented to herself and location.  Still working on placement.  12-11 still working on placement. Was able to work with pt today. Reportedly sat on the side of the bed. Pleasant.      Hospital Course   Presentation History   76 years old female with history of hypertension, type 2 diabetes, hyperlipidemia, osteoarthritis, bedbound, history of bilateral DVT who presented for evaluation of generalized weakness presented for weakness and inability to care for herself admitted with a diagnosis of UTI and NSTEMI cardiology on board, Noted trended down H/H.    See HPI for details.    Hospital Course (15 Days)      Anemia  Hemoglobin has been dropping from  From 12-10--8.2  I have ordered H&H monitor every 6 hours  Fecal occult blood test  Given patient has history of DVT I will continue the anticoagulation for now  Fecal occult blood test  Will monitor closely  No obvious bleeding noted  11/29: So far H&H stable between 8-9, continue heparin drip, pending hematology evaluation) final recommendation, fecal occult blood test pending, patient had 1 bowel movement yesterday which is brown, no obvious bleeding noted  11/30: H&H stayed stable, patient switched back to Pradaxa per heme-onc recommendation, fecal occult blood test came positive, GI consulted to weigh in, started on PPI  12/1-12/5: Discussed with GI, recommended outpatient EGD colonoscopy and Protonix 40 mg twice daily for a month followed by daily, H&H remained stable in fact uptrending plan communicated with the patient as well as niece    Sepsis likely from UTI  Presented with leukocytosis and tachycardia  CT scan of the chest showed small left pleural effusion and evidence of pulmonary hypertension  UA consistent with UTI  Blood culture and urine culture came negative  Initially started on empirical broad-spectrum antibiotic vancomycin and Zosyn  MRSA test is negative  CT abdomen showed nonobstructive stone and small bladder stone  Antibiotic de-escalated to ceftriaxone  Leukocytosis improved  11/29-11/30: Patient will complete 5 days course of antibiotics today        History of DVT  Patient had bilateral lower extremity DVT back in September 2023 currently on  Pradaxa  Repeat venous ultrasound showed left femoral popliteal DVT extending to the left peroneal vein and posterior tibialis vein with associated mild soft tissue swelling/edema and short segment nonocclusive thrombus within the right common femoral vein  I will involve hematology to guide on treatment  11/29: CTA ruled out PE, for now we will continue heparin drip, will switch her back to DOAC once  hematology clears  11/30: Patient is a switch to Pradaxa after hematology cleared the patient  12/1-12/5: Patient tolerated Pradaxa, outpatient follow-up with hematology    NSTEMI  Elevated troponin  Likely demand ischemia  Troponin trended up from 90-108  Patient is on aspirin, statin and Pradaxa  Cardiology on board, recommended to get CTA  Follow-up recommendation  11/29-12/5: 2D echo showed normal EF, no LV wall motion abnormality, elevated troponin is likely from demand ischemia and anemia, continue aspirin/statin/Coreg/losartan.  Outpatient follow-up with cardiology        Chronic stable medical condition  Hypertension: Continue amlodipine/metoprolol/losartan  11/29-12/5: Blood pressure within acceptable range, outpatient follow-up with PCP     Type 2 diabetes  Carb controlled diet  Sliding scale  Hypoglycemia protocol  12/1-12/5: Increase Lantus to 10, continue sliding scale, and carb controlled diet.  12/5: Blood sugar low 200s, I have increased the Lantus to 12, continue to monitor POC    Morbid obesity  Outpatient follow-up     Bedbound  Wound on her left iliac area  Wound nurse consult       Procedures/Imaging:   Upon my review: Negative except discussed above         Progress Note/Physical Exam at Discharge     Subjective:Patient is stable for discharge.    Vitals:    08/08/22 0710 08/08/22 0940 08/08/22 1200 08/08/22 1549   BP: 135/78 129/73 114/67 126/69   Pulse: 89 96 99 96   Resp: '16  16 16   '$ Temp: 98.2 F (36.8 C)  98.1 F (36.7 C) 97.8 F (36.6 C)   TempSrc: Oral  Oral Oral   SpO2: 96%  100% 100%   Weight: 103.1 kg (227 lb 4.7 oz)      Height:               General: Chronically sick looking, NAD, AAOx1  HEENT: Sclera anicteric, no conjunctival injection, OP: Clear, MMM  Neck: Supple, FROM, no LAD  Cardiovascular: RRR, no m/r/g  Lungs: CTAB, no w/r/r  Abdomen: Soft, +BS, NT/ND, no masses, no g/r  Extremities: No C/C/E  Skin: No rashes or lesions noted  Neuro: Weak all over but able to follow  commands and move all extremities       Diagnostics     Labs/Studies Pending at Discharge: Yes outpatient EGD and colonoscopy    Last Labs   Recent Labs   Lab 08/07/22  0351 08/05/22  0253 08/03/22  0422   WBC 9.09 9.67* 8.89   RBC 2.49* 2.56* 2.57*   Hgb 8.0* 8.3* 8.2*   Hematocrit 25.3* 25.6* 26.0*   MCV 101.6* 100.0* 101.2*   Platelets 356* 419* 442*         Recent Labs   Lab 08/07/22  1146 08/04/22  1036   Sodium 140 141   Potassium 4.1 4.4   Chloride 103 104   CO2 28 29   BUN 26.0* 19.0   Creatinine 0.8 0.7   Glucose 160* 191*   Calcium 8.6 9.1         Microbiology Results (last 15  days)       ** No results found for the last 360 hours. **             Patient Instructions   Discharge Diet: Heart healthy diet  Discharge Activity: As tolerated    Follow Up Appointment:   Follow-up Information       West Carbo, MD Follow up in 1 week(s).    Specialties: Gastroenterology, Internal Medicine  Contact information:  [place] [address]  [phone]               Tia Alert, MD Follow up in 1 week(s).    Specialties: Medical Oncology, Hematology  Contact information:  [address]  [place] [address]  [phone]               Vonzell Schlatter, MD Follow up in 1 week(s).    Specialties: Interventional Cardiology, Internal Medicine, Cardiology  Contact information:  [place]  [place] [address]  [phone]               Pcp, None, MD .                              Discharge Medications:     Medication List        START taking these medications      aspirin 81 MG chewable tablet  Chew 1 tablet (81 mg) by mouth daily     atorvastatin 40 MG tablet  Commonly known as: LIPITOR  Take 1 tablet (40 mg) by mouth nightly     carboxymethylcellulose 0.5 % ophthalmic solution  Commonly known as: REFRESH TEARS  Place 1 drop into both eyes 3 (three) times daily as needed (dry eye)     insulin glargine 100 UNIT/ML injection  Commonly known as: LANTUS  Inject 12 Units  into the skin nightly     pantoprazole 40 MG tablet  Commonly known as: PROTONIX  Take 1 tablet (40 mg) by mouth 2 (two) times daily for 30 days, THEN 1 tablet (40 mg) daily.  Start taking on: July 30, 2022            CONTINUE taking these medications      acetaminophen 500 MG tablet  Commonly known as: TYLENOL     CENTRUM SILVER 50+WOMEN PO     dabigatran 150 MG Caps  Commonly known as: PRADAXA     losartan 100 MG TABS, hydroCHLOROthiazide 25 MG TABS     metoprolol succinate 200 MG 24 hr tablet  Commonly known as: TOPROL-XL     TURMERIC PO     valACYclovir 1000 MG tablet  Commonly known as: VALTREX     vitamin D (ergocalciferol) 50000 UNIT Caps  Commonly known as: DRISDOL            STOP taking these medications      amLODIPine 5 MG tablet  Commonly known as: NORVASC     HUMULIN 70/30 KWIKPEN SC               Where to Get Your Medications        These medications were sent to [place], [place] - [address]  [address] [place] [address]      Phone: [phone]   aspirin 81 MG chewable tablet  atorvastatin 40 MG  tablet  pantoprazole 40 MG tablet       Information about where to get these medications is not yet available    Ask your nurse or doctor about these medications  carboxymethylcellulose 0.5 % ophthalmic solution  insulin glargine 100 UNIT/ML injection          Time spent examining patient, discussing with patient/family regarding hospital course, chart review, reconciling medications and discharge planning: 31 minutes.    Signed,  Danelle Earthly, MD    8:00 PM 08/08/2022

## 2022-08-08 NOTE — Progress Notes (Signed)
LOS # [id number]      Summary of Discharge Plan:  [facility] at [employer]    Identified Possible Discharge Barriers:  Financial clearance for LTC    CM Interventions and Outcome:  CM attempted call to [place] ([facility] Admissions representative) PH# [phone]. No answer. Message left.  CM called niece, Benancio Deeds at   [phone].   CM called main line at [facility] at [facility] [phone]. Transferred to [place]. CM to call back [place] in am if this CM does not hear from business office by noon. Awaiting info on bank account balance to determine if patient can be received Medicaid pending. Business office was to get in touch with CM but may have been contacting another CM.     Discussed above Discharge Plan with (patient, family, Care Team, others):  Pt, Care Team, attempted contact with facility and niece.    Case Management will continue to following on patient's discharge needs.

## 2022-08-08 NOTE — PT Progress Note (Signed)
Physical Therapy Note    Physical Therapy Treatment  Meredith Baker  Post Acute Care Therapy Recommendations   Discharge Recommendations:  SNF    DME needs IF patient is discharging home: Dressing stick, Long-handled sponge, Sock aid, Long-handled shoehorn, Hospital bed, Grab bars, BSC, Hoyer Lift    Therapy discharge recommendations may change with patient status.  Please refer to most recent note for up-to-date recommendations.    Unit: [address] [facility] [facility]  Bed: A2502/A2502-B    ___________________________________________________    Time of treatment:  PT Received On: 08/08/22  Start Time: [id number]  Stop Time: 1436  Time Calculation (min): 26 min       Chart Review and Collaboration with Care Team: 8 minutes, not included in above time.    PT Visit Number: 5    ___________________________________________________    Precautions:   Precautions  Weight Bearing Status: no restrictions  Restricted BP Precautions: no R UE  Fall Risks: High, Impaired balance/gait, Impaired mobility, Muscle weakness  Other Precautions: Bleeding & Sores    Personal Protective Equipment (PPE)  gloves and procedure mask    Updated X-Rays/Tests/Labs:  Lab Results   Component Value Date/Time    HGB 8.0 (L) 08/07/2022 03:51 AM    HCT 25.3 (L) 08/07/2022 03:51 AM    K 4.1 08/07/2022 11:46 AM    NA 140 08/07/2022 11:46 AM    INR 1.6 (H) 07/24/2022 01:17 AM    TROPI 103.4 (AA) 07/24/2022 06:38 AM    TROPI 108.5 (AA) 07/24/2022 12:26 AM    TROPI calc n/a 07/24/2022 12:26 AM    TROPI 92.0 (AA) 07/23/2022 04:48 PM       All imaging reviewed, please see chart for details.      Subjective:  "Is my golden walker here?"    Patient Goal: get stronger    Pain Assessment  Pain Assessment: No/denies pain (reports nurse gave medication shortly before PT arrived so pain feels managed.)           Patient's medical condition is appropriate for Physical Therapy intervention at this time.  Patient is agreeable to participation in the therapy session. Nursing  clears patient for therapy.      Objective:  Observation of Patient/Vital Signs:  Vitals:    08/08/22 1200   BP: 114/67   Pulse: 99   Resp: 16   Temp: 98.1 F (36.7 C)   SpO2: 100%         Cognition/Neuro Status  Arousal/Alertness: Appropriate responses to stimuli  Attention Span: Appears intact  Orientation Level: Oriented X4  Memory: Appears intact  Following Commands: Follows all commands and directions without difficulty  Safety Awareness: independent  Insights: Fully aware of deficits;Educated in safety awareness  Problem Solving: Able to problem solve independently  Behavior: calm;cooperative;attentive  Motor Planning: decreased initiation;decreased processing speed  Coordination: [place] impaired  Hand Dominance: right handed    Musculoskeletal Examination  Gross ROM  Right Upper Extremity ROM: within functional limits  Left Upper Extremity ROM: within functional limits  Right Lower Extremity ROM: needs focused assessment  Right Lower Extremity ROM % reduced: reduced by 50%  Left Lower Extremity ROM: needs focused assessment  Left Lower Extremity ROM % Reduced: reduced by 75%        Gross Strength  Right Lower Extremity Strength: 2+/5  Left Lower Extremity Strength: 2-/5            Functional Mobility  Supine to Sit: Maximal Assist;Increased Time;Increased Effort;using bedrail;HOB raised;to Left  Scooting to HOB: Dependent  Scooting to EOB: Maximal Assist;Increased Time;Increased Effort  Sit to Supine: Maximal Assist;to Right  Sit to Stand: Unable to assess (Comment) (poor sitting balance, requiring inc assistance as pt fatigued ue to posterior lean.)  Functional Mobility Deferred (Comment): poor sitting balance, requiring inc assistance as pt fatigued ue to posterior lean.  Transfers  Bed to Chair: Unable to assess (Comment) (poor sitting balance, requiring inc assistance as pt fatigued ue to posterior lean.)  Locomotion  Ambulation: Unable to assess (Comment) (poor sitting balance, requiring inc assistance as  pt fatigued ue to posterior lean.)  Distance Walked (ft) (Step 6,7): 0 Feet    Therapeutic Exercise  Heelslides: x 5 AAROM BLE  Glute Sets: x 5  Hip Abduction: x 5 AAROM BLE  Knee AROM Short Arc Quad: seated LAQ x 5 AAROM BLE  Ankle Pumps: x 10 AAROM BLE     Neuro Re-Ed  Sitting Balance: minimal assist;moderate assist;sitting reaching activities;with support           Educated the Patient to role of physical therapy, plan of care, goals of therapy and safety with mobility and ADLs, discharge instructions, home safety with verbalized understanding .    Patient left in bed with alarm and all other medical equipment in place and call bell and all personal items/needs within reach.  RN notified of session outcome. CM team and attending have been previously notified of d/c recommendation; no updates to d/c recommendation since that time.      Assessment:  Pt continues to make slow progress towards PT goals 2/2 dec functional mobility, strength, and functional endurance. Pt required max A for bed mobility requiring inc time/verbal instruction for UE placement on bed rails to assist with transfer. Pt initially required min A for sitting balance due to posterior lean when performing sitting reaching activities, but as pt fatigued required mod A. Pt assisted back to supine and performed supine TE with AAROM due to dec strength of BLE. Pt continues to benefit from skilled PT services to address functional impairments and mobility to prepare for safe d/c.       PMP Activity: Step 4 - Dangle at Bedside  Distance Walked (ft) (Step 6,7): 0 Feet    Plan:  Treatment/Interventions: Exercise, Neuromuscular re-education, Functional transfer training, LE strengthening/ROM, Bed mobility, Compensatory technique education      PT Frequency: 2-3x/wk   Continue plan of care.    Goals:  Goals  Goal Formulation: With patient  Time for Goal Acheivement: By time of discharge  Goals: Select goal  Pt Will Roll Right: with minimal assist, to  maximize functional mobility and independence, Partly met  Pt Will Go Supine To Sit: with moderate assist, to maximize functional mobility and independence, Partly met  Pt Will Achieve Sitting Balance: 3/5 sits without UE support up to 30 seconds, to maximize functional mobility and independence, Partly met  Pt Will Perform Home Exer Program: independent, to maximize functional mobility and independence, Partly met      Curt Jews, PT, DPT    Physical Medicine and [facility]   [phone]    08/08/2022  2:43 PM      [facility]  Patient: Meredith Baker MRN#: [id number]  Unit: [place] [facility] Bed: A2502/A2502-B

## 2022-08-09 LAB — BASIC METABOLIC PANEL
Anion Gap: 7 (ref 5.0–15.0)
BUN: 27 mg/dL — ABNORMAL HIGH (ref 7.0–21.0)
CO2: 29 mEq/L (ref 17–29)
Calcium: 8.9 mg/dL (ref 7.9–10.2)
Chloride: 101 mEq/L (ref 99–111)
Creatinine: 0.8 mg/dL (ref 0.4–1.0)
Glucose: 199 mg/dL — ABNORMAL HIGH (ref 70–100)
Potassium: 4.3 mEq/L (ref 3.5–5.3)
Sodium: 137 mEq/L (ref 135–145)
eGFR: >60 mL/min/{1.73_m2} (ref >=60)

## 2022-08-09 LAB — CBC AND DIFFERENTIAL
Absolute NRBC: 0 10*3/uL (ref 0.00–0.00)
Basophils Absolute Automated: 0.06 10*3/uL (ref 0.00–0.08)
Basophils Automated: 0.7 %
Eosinophils Absolute Automated: 0.49 10*3/uL — ABNORMAL HIGH (ref 0.00–0.44)
Eosinophils Automated: 5.5 %
Hematocrit: 27.7 % — ABNORMAL LOW (ref 34.7–43.7)
Hgb: 8.4 g/dL — ABNORMAL LOW (ref 11.4–14.8)
Immature Granulocytes Absolute: 0.03 10*3/uL (ref 0.00–0.07)
Immature Granulocytes: 0.3 %
Instrument Absolute Neutrophil Count: 5.41 10*3/uL (ref 1.10–6.33)
Lymphocytes Absolute Automated: 2.1 10*3/uL (ref 0.42–3.22)
Lymphocytes Automated: 23.7 %
MCH: 31 pg (ref 25.1–33.5)
MCHC: 30.3 g/dL — ABNORMAL LOW (ref 31.5–35.8)
MCV: 102.2 fL — ABNORMAL HIGH (ref 78.0–96.0)
MPV: 10 fL (ref 8.9–12.5)
Monocytes Absolute Automated: 0.76 10*3/uL (ref 0.21–0.85)
Monocytes: 8.6 %
Neutrophils Absolute: 5.41 10*3/uL (ref 1.10–6.33)
Neutrophils: 61.2 %
Nucleated RBC: 0 /100 WBC (ref 0.0–0.0)
Platelets: 336 10*3/uL (ref 142–346)
RBC: 2.71 10*6/uL — ABNORMAL LOW (ref 3.90–5.10)
RDW: 18 % — ABNORMAL HIGH (ref 11–15)
WBC: 8.85 10*3/uL (ref 3.10–9.50)

## 2022-08-09 LAB — GLUCOSE WHOLE BLOOD - POCT
Whole Blood Glucose POCT: 194 mg/dL — ABNORMAL HIGH (ref 70–100)
Whole Blood Glucose POCT: 298 mg/dL — ABNORMAL HIGH (ref 70–100)
Whole Blood Glucose POCT: 303 mg/dL — ABNORMAL HIGH (ref 70–100)
Whole Blood Glucose POCT: 199 mg/dL — ABNORMAL HIGH (ref 70–100)

## 2022-08-09 NOTE — OT Progress Note (Signed)
Occupational Therapy Treatment  [facility] Therapy Recommendations   Discharge Recommendations:  SNF      DME needs IF patient is discharging home: No additional equipment/DME recommended at this time    Therapy discharge recommendations may change with patient status.  Please refer to most recent note for up-to-date recommendations.    Unit: [address] [facility] [facility]  Bed: [id number]    ___________________________________________________    Time of treatment:  OT Received On: 08/09/22  Start Time: 1250  Stop Time: 1315  Time Calculation (min): 25 min       Chart Review and Collaboration with Care Team: 5 minutes, not included in above time.    OT Visit Number: 6      Precautions and Contraindications:    Precautions  Weight Bearing Status: no restrictions  Other Precautions: Bleeding & Sores    Personal Protective Equipment (PPE)  gloves and procedure mask    Updated Labs:  Lab Results   Component Value Date/Time    HGB 8.4 (L) 08/09/2022 04:50 AM    HCT 27.7 (L) 08/09/2022 04:50 AM    K 4.3 08/09/2022 04:50 AM    NA 137 08/09/2022 04:50 AM    INR 1.6 (H) 07/24/2022 01:17 AM    TROPI 103.4 (AA) 07/24/2022 06:38 AM    TROPI 108.5 (AA) 07/24/2022 12:26 AM    TROPI calc n/a 07/24/2022 12:26 AM    TROPI 92.0 (AA) 07/23/2022 04:48 PM       All imaging reviewed, please see chart for details.    Subjective: "I'll sit up."          Patient's medical condition is appropriate for Occupational Therapy intervention at this time.  Patient is agreeable to participation in the therapy session. Nursing clears patient for therapy.  Pain Assessment  Pain Assessment: No/denies pain      Objective:  Observation of Patient/Vital Signs:  Stable      Cognition/Neuro Status  Arousal/Alertness: Appropriate responses to stimuli  Attention Span: Appears intact  Orientation Level: Oriented X4  Memory: Appears intact  Following Commands: Follows all commands and directions without difficulty  Safety Awareness:  independent  Insights: Fully aware of deficits;Educated in safety awareness  Problem Solving: Able to problem solve independently  Behavior: calm;cooperative;attentive    Functional Mobility  Scooting Transfers: Minimal Assist;additional time (to [facility] using overhead grab bar)  Supine to Sit Transfers: Maximal Assist;additional time  Sit to Supine Transfers: Maximal Assist;additional time    Self-care and Home Management  Grooming: Stand by Assist;in bed;wash/dry hands;wash/dry face        Educated the Patient to role of occupational therapy, plan of care, goals of therapy and safety with mobility and ADLs, energy conservation techniques with verbalized understanding  and demonstrated understanding.     Patient left in bed with alarm and all other medical equipment in place and call bell and all personal items/needs within reach.  RN notified of session outcome.         Assessment: Pt is progressing towards OT goals. Main limitations are decreased activity endurance, decreased functional mobility, weakness and fatigue limiting participation in functional mobility and ADL tasks. Pt has demonstrated an increase in sitting balance at EOB.  Pt would benefit from continued OT services in order to maximize potential. Therapist continues to recommend [facility] to SNF.              PMP Activity: Step 4 - Dangle  at Bedside  Distance Walked (ft) (Step 6,7): 0 Feet      Plan:  OT Frequency Recommended: 2-3x/wk  Goal Formulation: Patient      Time For Goal Achievement: by time of discharge  ADL Goals  Patient will feed self: Independent, Goal met  Patient will groom self: Independent, Not met  Mobility and Transfer Goals  Other Goal: Patient will transitioned supine <> EOB c Min A; not met                       Continue plan of care.      Theresia Lo, COTA/L    Physical Medicine and [facility]  [phone]    08/09/2022  1:44 PM    [facility]  Patient: Meredith Baker MRN#: [id number]    Unit: [place] [facility] Bed: [id number]

## 2022-08-09 NOTE — Progress Notes (Signed)
LOS # 16      Summary of Discharge Plan:  [facility] with [facility]    Identified Possible Discharge Barriers:  Financial clearance for placement vs home plan.    CM Interventions and Outcome:  CM received call from "niece" of patient Quay Burow who stated her aunt was supposed to put her as a contact on file. CM stated only contact on file is [place] whom CM attempted call to and left message. CM to seek permission from patient to discuss care with [facility].  CM called main line at [facility] [phone]. Transferred to [place]. States she sent message and email to business office to contact CM. She included her management for support. Provided CM with contact for business office in [place]. [place] attempted call to Tye [place] at [phone]. CM got an answering service that took a message to call back CM.  CM received call back x 2 from [place] to see if CM had heard from business office. No contact to CM. [place] has her management involved.   CM spoke with patient at bedside at length. Pt understands she cannot continue to remain in hospital and an alternative plan needs to be considered. Pt does not think she can go to nieces home and states they are all in [place] and that's not an option. Pt agreeable to CM discussing care is [place]. Pt considering going back home. States she worked hard for apartment. It's nice and clean. CM stated we cannot force her to do what she doesn't want to do but if she is not interested/willing to go to, team should not continue to pursue things like placement. Will think about what she wants to do.   CM called [place], received Marlayna's contact. Niece states she can come to her home but will have to pay rent as they are willing to help her if she needs help but she is funding this man who is horrible. States patient is nice when she first comes but then gets nasty with her. Benancio Deeds will come to visit tomorrow after  work.    Discussed above Discharge Plan with (patient, family, Care Team, others):  Pt, niece, Care Team    Case Management will continue to following on patient's discharge needs.

## 2022-08-09 NOTE — Discharge Summary (Signed)
[facility]      Patient: Meredith Baker  Admission Date: 07/23/2022   DOB: [birth year]  Discharge Date: 08/09/2022    MRN: [id number]  Discharge Attending:Abrial Arrighi Candiss Norse, MD     Referring Physician: Pcp, None, MD  PCP: Pcp, None, MD       DISCHARGE SUMMARY     Discharge Information   Admission Diagnosis:   NSTEMI (non-ST elevated myocardial infarction)    Discharge Diagnosis:   Megaloblastic anemia  Sepsis  UTI?  History of DVT  Elevated troponin  Hypertension  Type 2 diabetes with hyperglycemia  Morbid obesity  Hypercoagulable state  Left iliac wound    Admission Condition: Guarded  Discharge Condition: Stable and improved  Consultants: Cardiology/pathology and gastroenterology  Functional Status: Bedbound  Discharged to: SNF, awaiting placement     Hospital Course   Presentation History   76 years old female with history of hypertension, type 2 diabetes, hyperlipidemia, osteoarthritis, bedbound, history of bilateral DVT who presented for evaluation of generalized weakness presented for weakness and inability to care for herself admitted with a diagnosis of UTI and NSTEMI cardiology on board, Noted trended down H/H.    See HPI for details.    Hospital Course (16 Days)         Anemia  Hemoglobin has been dropping from  From 12-10--8.2  I have ordered H&H monitor every 6 hours  Fecal occult blood test  Given patient has history of DVT I will continue the anticoagulation for now  Fecal occult blood test  Will monitor closely  No obvious bleeding noted  11/29: So far H&H stable between 8-9, continue heparin drip, pending hematology evaluation) final recommendation, fecal occult blood test pending, patient had 1 bowel movement yesterday which is brown, no obvious bleeding noted  11/30: H&H stayed stable, patient switched back to Pradaxa per heme-onc recommendation, fecal occult blood test came positive, GI consulted to weigh in, started on PPI  12/1-12/5: Discussed with GI, recommended outpatient EGD  colonoscopy and Protonix 40 mg twice daily for a month followed by daily, H&H remained stable in fact uptrending plan communicated with the patient as well as niece  08/09/2022: H&H largely stable, will continue following until the patient is discharged    Sepsis likely from UTI  Presented with leukocytosis and tachycardia  CT scan of the chest showed small left pleural effusion and evidence of pulmonary hypertension  UA consistent with UTI  Blood culture and urine culture came negative  Initially started on empirical broad-spectrum antibiotic vancomycin and Zosyn  MRSA test is negative  CT abdomen showed nonobstructive stone and small bladder stone  Antibiotic de-escalated to ceftriaxone  Leukocytosis improved  11/29-11/30: Patient will complete 5 days course of antibiotics today  08/09/2022: No symptoms of UTI, leukocytosis resolved for several days now        History of DVT  Patient had bilateral lower extremity DVT back in September 2023 currently on Pradaxa  Repeat venous ultrasound showed left femoral popliteal DVT extending to the left peroneal vein and posterior tibialis vein with associated mild soft tissue swelling/edema and short segment nonocclusive thrombus within the right common femoral vein  I will involve hematology to guide on treatment  11/29: CTA ruled out PE, for now we will continue heparin drip, will switch her back to [place] once hematology clears  11/30: Patient is a switch to Pradaxa after hematology cleared the patient  12/1-12/5: Patient tolerated Pradaxa, outpatient follow-up with hematology  08/09/2022: Tolerating Pradaxa  NSTEMI  Elevated troponin  Likely demand ischemia  Troponin trended up from 90-108  Patient is on aspirin, statin and Pradaxa  11/29-12/5: 2D echo showed normal EF, no LV wall motion abnormality, elevated troponin is likely from demand ischemia and anemia, continue aspirin/statin/Coreg/losartan.  Outpatient follow-up with cardiology  08/09/2022: No chest pain         Chronic stable medical condition  Hypertension: Continue amlodipine/metoprolol/losartan  11/29-12/5: Blood pressure within acceptable range, outpatient follow-up with PCP     Type 2 diabetes  Carb controlled diet  Sliding scale  Hypoglycemia protocol  12/1-12/5: Increase Lantus to 10, continue sliding scale, and carb controlled diet.  12/5: Blood sugar low 200s, I have increased the Lantus to 12, continue to monitor POC    Morbid obesity  Outpatient follow-up     Bedbound  Wound on her left iliac area  Wound nurse consult       Procedures/Imaging:   Upon my review: Negative except discussed above         Progress Note/Physical Exam at Discharge     Subjective:Patient is stable for discharge.    Vitals:    08/09/22 0833 08/09/22 0917 08/09/22 1120 08/09/22 1531   BP: 114/69 102/62 94/55 133/70   Pulse: 88 89 89 92   Resp:  '16 16 16   '$ Temp:  98.4 F (36.9 C) 98.4 F (36.9 C) 98.8 F (37.1 C)   TempSrc:  Oral Oral Oral   SpO2:  100% 100% 97%   Weight: 104 kg (229 lb 4.5 oz)      Height:               GEN APPEARANCE: Normal;  A&OX2, on room air  HEENT: Nonicteric  NECK: Supple;  CVS: RRR, S1, S2  LUNGS: Decreased breath sounds at bases bilaterally with improvement towards apices  ABD: Soft; No TTP; + Normoactive BS  EXT: No edema  NEURO: No new focal deficits noted         Diagnostics     Labs/Studies Pending at Discharge: Yes outpatient EGD and colonoscopy    Last Labs   Recent Labs   Lab 08/09/22  0450 08/07/22  0351 08/05/22  0253   WBC 8.85 9.09 9.67*   RBC 2.71* 2.49* 2.56*   Hgb 8.4* 8.0* 8.3*   Hematocrit 27.7* 25.3* 25.6*   MCV 102.2* 101.6* 100.0*   Platelets 336 356* 419*         Recent Labs   Lab 08/09/22  0450 08/07/22  1146 08/04/22  1036   Sodium 137 140 141   Potassium 4.3 4.1 4.4   Chloride 101 103 104   CO2 '29 28 29   '$ BUN 27.0* 26.0* 19.0   Creatinine 0.8 0.8 0.7   Glucose 199* 160* 191*   Calcium 8.9 8.6 9.1         Microbiology Results (last 15 days)       ** No results found for the last 360  hours. **             Patient Instructions   Discharge Diet: Heart healthy diet  Discharge Activity: As tolerated    Follow Up Appointment:   Follow-up Information       West Carbo, MD Follow up in 1 week(s).    Specialties: Gastroenterology, Internal Medicine  Contact information:  [address]  [place] [address]  [phone]  Tia Alert, MD Follow up in 1 week(s).    Specialties: Medical Oncology, Hematology  Contact information:  [address]  [place] [address]  [phone]               Vonzell Schlatter, MD Follow up in 1 week(s).    Specialties: Interventional Cardiology, Internal Medicine, Cardiology  Contact information:  [address]  [place] [address]  [phone]               Pcp, None, MD .                              Discharge Medications:     Medication List        START taking these medications      aspirin 81 MG chewable tablet  Chew 1 tablet (81 mg) by mouth daily     atorvastatin 40 MG tablet  Commonly known as: LIPITOR  Take 1 tablet (40 mg) by mouth nightly     carboxymethylcellulose 0.5 % ophthalmic solution  Commonly known as: REFRESH TEARS  Place 1 drop into both eyes 3 (three) times daily as needed (dry eye)     insulin glargine 100 UNIT/ML injection  Commonly known as: LANTUS  Inject 12 Units into the skin nightly     pantoprazole 40 MG tablet  Commonly known as: PROTONIX  Take 1 tablet (40 mg) by mouth 2 (two) times daily for 30 days, THEN 1 tablet (40 mg) daily.  Start taking on: July 30, 2022            CONTINUE taking these medications      acetaminophen 500 MG tablet  Commonly known as: TYLENOL     CENTRUM SILVER 50+WOMEN PO     dabigatran 150 MG Caps  Commonly known as: PRADAXA     losartan 100 MG TABS, hydroCHLOROthiazide 25 MG TABS     metoprolol succinate 200 MG 24 hr tablet  Commonly known as: TOPROL-XL     TURMERIC PO     valACYclovir 1000 MG tablet  Commonly known as: VALTREX     vitamin D (ergocalciferol)  50000 UNIT Caps  Commonly known as: DRISDOL            STOP taking these medications      amLODIPine 5 MG tablet  Commonly known as: NORVASC     HUMULIN 70/30 KWIKPEN SC               Where to Get Your Medications        These medications were sent to [facility] [place], [place] - [address]  [address] [place] [address]      Phone: [phone]   aspirin 81 MG chewable tablet  atorvastatin 40 MG tablet  pantoprazole 40 MG tablet       Information about where to get these medications is not yet available    Ask your nurse or doctor about these medications  carboxymethylcellulose 0.5 % ophthalmic solution  insulin glargine 100 UNIT/ML injection          Time spent examining patient, discussing with patient/family regarding hospital course, chart review, reconciling medications and discharge planning: 40 minutes.    Signed,  Meredith Pel, MD    5:03 PM 08/09/2022

## 2022-08-09 NOTE — Nursing Progress Note (Signed)
NURSING SHIFT NOTE     Patient: Annaclara Search  Day: 16      SHIFT EVENTS     Shift Narrative/Significant Events (PRN med administration, fall, RRT, etc.):   Patient alert and oriented  x2-3. Assisted with adls and medicated as due. No adverse reaction noted from medications. Turned and repositioned per protocol. No change in patient's over all general condition during shift.    Safety and fall precautions remain in place. Purposeful rounding completed. LVS stable.         ASSESSMENT     Changes in assessment from patient's baseline this shift:    Neuro: No  CV: No  Pulm: No  Peripheral Vascular: No  HEENT: No  GI: No  BM during shift: No, Last BM: Last BM Date: 08/08/22  GU: No   Integ: No  MS: No    Pain: None  Pain Interventions: None  Medications Utilized: None    Mobility: PMP Activity: Step 4 - Dangle at Bedside of Distance Walked (ft) (Step 6,7): 0 Feet           Lines     Patient Lines/Drains/Airways Status       Active Lines, Drains and Airways       Name Placement date Placement time Site Days    Peripheral IV 07/24/22 20 G Anterior;Left Forearm 07/24/22  0030  Forearm  16    External Urinary Catheter 07/23/22  1602  --  17                         VITAL SIGNS     Vitals:    08/09/22 1957   BP: 112/68   Pulse: 89   Resp: 16   Temp: 99 F (37.2 C)   SpO2: 98%       Temp  Min: 98.4 F (36.9 C)  Max: 99 F (37.2 C)  Pulse  Min: 88  Max: 92  Resp  Min: 16  Max: 16  BP  Min: 94/55  Max: 142/64  SpO2  Min: 97 %  Max: 100 %      Intake/Output Summary (Last 24 hours) at 08/09/2022 2021  Last data filed at 08/09/2022 1746  Gross per 24 hour   Intake 550 ml   Output 1200 ml   Net -650 ml               Symptoms:       CARE PLAN

## 2022-08-10 LAB — GLUCOSE WHOLE BLOOD - POCT
Whole Blood Glucose POCT: 219 mg/dL — ABNORMAL HIGH (ref 70–100)
Whole Blood Glucose POCT: 169 mg/dL — ABNORMAL HIGH (ref 70–100)
Whole Blood Glucose POCT: 143 mg/dL — ABNORMAL HIGH (ref 70–100)
Whole Blood Glucose POCT: 196 mg/dL — ABNORMAL HIGH (ref 70–100)

## 2022-08-10 LAB — BASIC METABOLIC PANEL
Anion Gap: 7 (ref 5.0–15.0)
BUN: 26 mg/dL — ABNORMAL HIGH (ref 7.0–21.0)
CO2: 28 mEq/L (ref 17–29)
Calcium: 8.6 mg/dL (ref 7.9–10.2)
Chloride: 100 mEq/L (ref 99–111)
Creatinine: 0.8 mg/dL (ref 0.4–1.0)
Glucose: 297 mg/dL — ABNORMAL HIGH (ref 70–100)
Potassium: 4.3 mEq/L (ref 3.5–5.3)
Sodium: 135 mEq/L (ref 135–145)
eGFR: >60 mL/min/{1.73_m2} (ref >=60)

## 2022-08-10 LAB — HEMOGLOBIN AND HEMATOCRIT, BLOOD
Hematocrit: 26.8 % — ABNORMAL LOW (ref 34.7–43.7)
Hgb: 8.2 g/dL — ABNORMAL LOW (ref 11.4–14.8)

## 2022-08-10 LAB — MAGNESIUM: Magnesium: 0.9 mg/dL — CL (ref 1.6–2.6)

## 2022-08-10 MED ORDER — TRAMADOL HCL 50 MG PO TABS
50.0000 mg | ORAL_TABLET | Freq: Four times a day (QID) | ORAL | Status: DC | PRN
Start: 2022-08-10 — End: 2022-08-15
  Administered 2022-08-14: 50 mg via ORAL
  Filled 2022-08-10: qty 1

## 2022-08-10 NOTE — Progress Notes (Addendum)
LOS # 17      Summary of Discharge Plan:  [facility] with [facility]     Identified Possible Discharge Barriers:  Financial clearance for placement vs home plan.     CM Interventions and Outcome:  CM received call from [place]. Stated CM should be hearing from business office today.  CM received call from Tye [place] at [phone]. Stated attempted to get bank account balance with patient and bank on the phone prior. Pt was unable to provide security question answers. CM attempted to coordinate call with patient. Patient sound asleep and difficult to arouse.  CM met with patient at beside. Reviewed [place] desire/plan. Pt wants to continue to pursue LTC placement. States she is willing to try talking to the facility and bank again but not sure what they need. CM stated without this information we would need to work on getting her home or to one of her nieces.  Warm handoff provided to [place], CM to coordinate call with pt and Tye [place] at [phone].    Discussed above Discharge Plan with (patient, family, Care Team, others):  Pt, SNF.    Case Management will continue to following on patient's discharge needs.

## 2022-08-10 NOTE — Progress Notes (Signed)
CM called and left VM to [facility] at [phone] to f/u regarding acceptance and bed availability. CM awaiting return call. CM call Tye [place] at [phone] but unable to reach [place]. CM provided her information for [place] to contact back.       Manus Rudd, RN, BSN, [facility]  [phone]

## 2022-08-10 NOTE — Plan of Care (Signed)
NURSING SHIFT NOTE     Patient: Meredith Baker  Day: 17      SHIFT EVENTS     Shift Narrative/Significant Events (PRN med administration, fall, RRT, etc.):   Pt is forgetful and fatigued.    External cath removed due to extensive injury to the Pt perineum and lower labias. Pt has skin tears and moisture associated wound that are very tender and have sanguinous drainage. Pt cleaned frequently due to incontinence. Provider notified for pain medication due to the PT crying for pain especially during peri care. Tramadol and wound consult ordered.    AM Mg was 0.9. Provider notified and 2 bags infused. Notified the oncoming RN to complete the electrolyte replacement protocol.  Safety and fall precautions remain in place. Purposeful rounding completed.          ASSESSMENT     Changes in assessment from patient's baseline this shift:    Neuro: No  CV: No  Pulm: No  Peripheral Vascular: No  HEENT: No  GI: No  BM during shift: Yes   , Last BM: Last BM Date: 08/10/22  GU: No   Integ: No  MS: No    Pain: Worsening  Pain Interventions: Medications, Rest, and Positioning  Medications Utilized: Tramadol ordered. Visited the PT to assess and Pt was asleep. Tramadol not given.    Mobility: PMP Activity: Step 3 - Bed Mobility of Distance Walked (ft) (Step 6,7): 0 Feet           Lines     Patient Lines/Drains/Airways Status       Active Lines, Drains and Airways       Name Placement date Placement time Site Days    Peripheral IV 08/10/22 22 G Left Antecubital 08/10/22  0550  Antecubital  less than 1    External Urinary Catheter 07/23/22  1602  --  17                         VITAL SIGNS     Vitals:    08/10/22 0744   BP: 147/74   Pulse: 89   Resp: 18   Temp: 98.2 F (36.8 C)   SpO2: 99%       Temp  Min: 98.2 F (36.8 C)  Max: 99 F (37.2 C)  Pulse  Min: 88  Max: 96  Resp  Min: 16  Max: 18  BP  Min: 94/55  Max: 147/74  SpO2  Min: 97 %  Max: 100 %      Intake/Output Summary (Last 24 hours) at 08/10/2022 [id number]  Last data filed at  08/10/2022 0654  Gross per 24 hour   Intake 990 ml   Output 900 ml   Net 90 ml            CARE PLAN       Problem: Pain interferes with ability to perform ADL  Goal: Pain at adequate level as identified by patient  Outcome: Progressing  Flowsheets (Taken 08/08/2022 1200 by Velva Harman, RN)  Pain at adequate level as identified by patient:   Identify patient comfort function goal   Assess for risk of opioid induced respiratory depression, including snoring/sleep apnea. Alert healthcare team of risk factors identified.   Assess pain on admission, during daily assessment and/or before any "as needed" intervention(s)   Reassess pain within 30-60 minutes of any procedure/intervention, per Pain Assessment, Intervention, Reassessment (AIR) Cycle   Evaluate if patient comfort function  goal is met   Evaluate patient's satisfaction with pain management progress   Offer non-pharmacological pain management interventions     Problem: Moderate/High Fall Risk Score >5  Goal: Patient will remain free of falls  Outcome: Progressing     Problem: Compromised Tissue integrity  Goal: Damaged tissue is healing and protected  Outcome: Progressing  Flowsheets (Taken 08/09/2022 0921 by Vickii Penna, RN)  Damaged tissue is healing and protected:   Monitor/assess Braden scale every shift   Provide wound care per wound care algorithm   Reposition patient every 2 hours and as needed unless able to reposition self   Increase activity as tolerated/progressive mobility   Relieve pressure to bony prominences for patients at moderate and high risk   Avoid shearing injuries   Keep intact skin clean and dry   Use bath wipes, not soap and water, for daily bathing   Monitor patient's hygiene practices   Monitor external devices/tubes for correct placement to prevent pressure, friction and shearing   Use incontinence wipes for cleaning urine, stool and caustic drainage. Foley care as needed     Problem: Safety  Goal: Patient will be free  from injury during hospitalization  Outcome: Progressing  Flowsheets (Taken 08/08/2022 1650 by Velva Harman, RN)  Patient will be free from injury during hospitalization:   Assess patient's risk for falls and implement fall prevention plan of care per policy   Provide and maintain safe environment   Use appropriate transfer methods   Ensure appropriate safety devices are available at the bedside   Include patient/ family/ care giver in decisions related to safety   Assess for patients risk for elopement and implement Elopement Risk Plan per policy     Problem: Fluid and Electrolyte Imbalance/ Endocrine  Goal: Fluid and electrolyte balance are achieved/maintained  Outcome: Progressing  Flowsheets (Taken 08/08/2022 1650 by Velva Harman, RN)  Fluid and electrolyte balance are achieved/maintained:   Monitor/assess lab values and report abnormal values   Assess and reassess fluid and electrolyte status   Observe for cardiac arrhythmias   Monitor for muscle weakness     Problem: Hemodynamic Status: Cardiac  Goal: Stable vital signs and fluid balance  Outcome: Progressing  Flowsheets (Taken 08/08/2022 1650 by Velva Harman, RN)  Stable vital signs and fluid balance:   Assess signs and symptoms associated with cardiac rhythm changes   Monitor lab values     Problem: Bladder/Voiding  Goal: Patient will experience proper bladder emptying during admission and remain free from infection  Outcome: Progressing  Flowsheets (Taken 08/08/2022 1650 by Velva Harman, RN)  Patient will experience proper bladder emptying during admission and remain free from infection: Encourage patient to identify medications that aid bladder function prior to administration     Problem: Diabetes: Glucose Imbalance  Goal: Blood glucose stable at established goal  Outcome: Progressing  Flowsheets (Taken 08/08/2022 1650 by Velva Harman, RN)  Blood glucose stable at established goal:   Monitor lab values   Assess for hypoglycemia  /hyperglycemia   Monitor/assess vital signs   Monitor intake and output.  Notify LIP if urine output is < 30 mL/hour.   Ensure adequate hydration   Include patient/family in decisions related to nutrition/dietary selections

## 2022-08-10 NOTE — Plan of Care (Signed)
Problem: Pain interferes with ability to perform ADL  Goal: Pain at adequate level as identified by patient  Outcome: Progressing  Flowsheets (Taken 08/08/2022 1200 by Velva Harman, RN)  Pain at adequate level as identified by patient:   Identify patient comfort function goal   Assess for risk of opioid induced respiratory depression, including snoring/sleep apnea. Alert healthcare team of risk factors identified.   Assess pain on admission, during daily assessment and/or before any "as needed" intervention(s)   Reassess pain within 30-60 minutes of any procedure/intervention, per Pain Assessment, Intervention, Reassessment (AIR) Cycle   Evaluate if patient comfort function goal is met   Evaluate patient's satisfaction with pain management progress   Offer non-pharmacological pain management interventions     Problem: Side Effects from Pain Analgesia  Goal: Patient will experience minimal side effects of analgesic therapy  Outcome: Progressing  Flowsheets (Taken 08/08/2022 1200 by Velva Harman, RN)  Patient will experience minimal side effects of analgesic therapy:   Monitor/assess patient's respiratory status (RR depth, effort, breath sounds)   Assess for changes in cognitive function   Prevent/manage side effects per LIP orders (i.e. nausea, vomiting, pruritus, constipation, urinary retention, etc.)   Evaluate for opioid-induced sedation with appropriate assessment tool (i.e. POSS)     Problem: Moderate/High Fall Risk Score >5  Goal: Patient will remain free of falls  Outcome: Progressing  Flowsheets  Taken 08/10/2022 0300 by Marlana Latus, RN  High (Greater than 13):   HIGH-Bed alarm on at all times while patient in bed   HIGH-Apply yellow "Fall Risk" arm band   HIGH-Consider use of low bed  Taken 08/07/2022 1606 by Rummel, Sunny Schlein, RN  VH High Risk (Greater than 13):   ALL REQUIRED LOW INTERVENTIONS   ALL REQUIRED MODERATE INTERVENTIONS   A CHAIR PAD ALARM WILL BE USED WHEN PATIENT IS UP SITTING  IN A CHAIR   Keep door open for better visibility   Use assistive devices   Use chair-pad alarm device   Use of floor mat     Problem: Compromised Tissue integrity  Goal: Damaged tissue is healing and protected  Outcome: Progressing  Flowsheets (Taken 08/09/2022 [id number] by Vickii Penna, RN)  Damaged tissue is healing and protected:   Monitor/assess Braden scale every shift   Provide wound care per wound care algorithm   Reposition patient every 2 hours and as needed unless able to reposition self   Increase activity as tolerated/progressive mobility   Relieve pressure to bony prominences for patients at moderate and high risk   Avoid shearing injuries   Keep intact skin clean and dry   Use bath wipes, not soap and water, for daily bathing   Monitor patient's hygiene practices   Monitor external devices/tubes for correct placement to prevent pressure, friction and shearing   Use incontinence wipes for cleaning urine, stool and caustic drainage. Foley care as needed  Goal: Nutritional status is improving  Outcome: Progressing  Flowsheets (Taken 08/08/2022 1600 by Velva Harman, RN)  Nutritional status is improving:   Allow adequate time for meals   Encourage patient to take dietary supplement(s) as ordered     Problem: Safety  Goal: Patient will be free from injury during hospitalization  Outcome: Progressing  Flowsheets (Taken 08/08/2022 1650 by Velva Harman, RN)  Patient will be free from injury during hospitalization:   Assess patient's risk for falls and implement fall prevention plan of care per policy   Provide and maintain safe environment   Use  appropriate transfer methods   Ensure appropriate safety devices are available at the bedside   Include patient/ family/ care giver in decisions related to safety   Assess for patients risk for elopement and implement Elopement Risk Plan per policy  Goal: Patient will be free from infection during hospitalization  Outcome: Progressing  Flowsheets (Taken  08/08/2022 1650 by Velva Harman, RN)  Free from Infection during hospitalization:   Assess and monitor for signs and symptoms of infection   Monitor lab/diagnostic results   Encourage patient and family to use good hand hygiene technique   Monitor all insertion sites (i.e. indwelling lines, tubes, urinary catheters, and drains)     Problem: Fluid and Electrolyte Imbalance/ Endocrine  Goal: Fluid and electrolyte balance are achieved/maintained  Outcome: Progressing  Flowsheets (Taken 08/08/2022 1650 by Velva Harman, RN)  Fluid and electrolyte balance are achieved/maintained:   Monitor/assess lab values and report abnormal values   Assess and reassess fluid and electrolyte status   Observe for cardiac arrhythmias   Monitor for muscle weakness  Goal: Adequate hydration  Flowsheets (Taken 08/08/2022 1650 by Velva Harman, RN)  Adequate hydration:   Assess mucus membranes, skin color, turgor, perfusion and presence of edema   Monitor and assess vital signs and perfusion   Assess for peripheral, sacral, periorbital and abdominal edema     Problem: Diabetes: Glucose Imbalance  Goal: Blood glucose stable at established goal  Outcome: Progressing  Flowsheets (Taken 08/08/2022 1650 by Velva Harman, RN)  Blood glucose stable at established goal:   Monitor lab values   Assess for hypoglycemia /hyperglycemia   Monitor/assess vital signs   Monitor intake and output.  Notify LIP if urine output is < 30 mL/hour.   Ensure adequate hydration   Include patient/family in decisions related to nutrition/dietary selections     Problem: Hemodynamic Status: Cardiac  Goal: Stable vital signs and fluid balance  Outcome: Progressing  Flowsheets (Taken 08/08/2022 1650 by Velva Harman, RN)  Stable vital signs and fluid balance:   Assess signs and symptoms associated with cardiac rhythm changes   Monitor lab values     Problem: Bladder/Voiding  Goal: Patient will experience proper bladder emptying during admission and remain  free from infection  Outcome: Progressing  Flowsheets (Taken 08/08/2022 1650 by Velva Harman, RN)  Patient will experience proper bladder emptying during admission and remain free from infection: Encourage patient to identify medications that aid bladder function prior to administration

## 2022-08-10 NOTE — Discharge Summary (Signed)
[facility]      Patient: Meredith Baker  Admission Date: 07/23/2022   DOB: [birth year]  Discharge Date: 08/10/2022    MRN: [id number]  Discharge Attending:Yann Biehn Candiss Norse, MD     Referring Physician: Pcp, None, MD  PCP: Pcp, None, MD       DISCHARGE SUMMARY     Discharge Information   Admission Diagnosis:   NSTEMI (non-ST elevated myocardial infarction)    Discharge Diagnosis:   Megaloblastic anemia  Sepsis  UTI?  History of DVT  Elevated troponin  Hypertension  Type 2 diabetes with hyperglycemia  Morbid obesity  Hypercoagulable state  Left iliac wound    Admission Condition: Guarded  Discharge Condition: Stable and improved  Consultants: Cardiology/pathology and gastroenterology  Functional Status: Bedbound  Discharged to: SNF, awaiting placement     Hospital Course   Presentation History   76 years old female with history of hypertension, type 2 diabetes, hyperlipidemia, osteoarthritis, bedbound, history of bilateral DVT who presented for evaluation of generalized weakness presented for weakness and inability to care for herself admitted with a diagnosis of UTI and NSTEMI cardiology on board, Noted trended down H/H.    See HPI for details.    Hospital Course (17 Days)   August 10, 2022: Due to skin breakdown on the labia as noted by wound care, external urinary catheter was not a possibility.  Patient consistently was urinating on herself.  Place Foley catheter to help with the wounds heal around the groin.  Spoken to nursing.  Seen and examined today with nurse at bedside as well.  Was asleep on the first encounter however was able to be woken up later on as stated that she was doing fine.  Still awaiting placement.      Anemia  Hemoglobin has been dropping from  From 12-10--8.2  I have ordered H&H monitor every 6 hours  Fecal occult blood test  Given patient has history of DVT I will continue the anticoagulation for now  Fecal occult blood test  Will monitor closely  No obvious bleeding noted  11/29:  So far H&H stable between 8-9, continue heparin drip, pending hematology evaluation) final recommendation, fecal occult blood test pending, patient had 1 bowel movement yesterday which is brown, no obvious bleeding noted  11/30: H&H stayed stable, patient switched back to Pradaxa per heme-onc recommendation, fecal occult blood test came positive, GI consulted to weigh in, started on PPI  12/1-12/5: Discussed with GI, recommended outpatient EGD colonoscopy and Protonix 40 mg twice daily for a month followed by daily, H&H remained stable in fact uptrending plan communicated with the patient as well as niece  08/09/2022: H&H largely stable, will continue following until the patient is discharged    Sepsis likely from UTI  Presented with leukocytosis and tachycardia  CT scan of the chest showed small left pleural effusion and evidence of pulmonary hypertension  UA consistent with UTI  Blood culture and urine culture came negative  Initially started on empirical broad-spectrum antibiotic vancomycin and Zosyn  MRSA test is negative  CT abdomen showed nonobstructive stone and small bladder stone  Antibiotic de-escalated to ceftriaxone  Leukocytosis improved  11/29-11/30: Patient will complete 5 days course of antibiotics today  08/09/2022: No symptoms of UTI, leukocytosis resolved for several days now        History of DVT  Patient had bilateral lower extremity DVT back in September 2023 currently on Pradaxa  Repeat venous ultrasound showed left femoral popliteal DVT extending to the left  peroneal vein and posterior tibialis vein with associated mild soft tissue swelling/edema and short segment nonocclusive thrombus within the right common femoral vein  I will involve hematology to guide on treatment  11/29: CTA ruled out PE, for now we will continue heparin drip, will switch her back to DOAC once hematology clears  11/30: Patient is a switch to Pradaxa after hematology cleared the patient  12/1-12/5: Patient tolerated  Pradaxa, outpatient follow-up with hematology  08/09/2022: Tolerating Pradaxa    NSTEMI  Elevated troponin  Likely demand ischemia  Troponin trended up from 90-108  Patient is on aspirin, statin and Pradaxa  11/29-12/5: 2D echo showed normal EF, no LV wall motion abnormality, elevated troponin is likely from demand ischemia and anemia, continue aspirin/statin/Coreg/losartan.  Outpatient follow-up with cardiology  08/09/2022: No chest pain        Chronic stable medical condition  Hypertension: Continue amlodipine/metoprolol/losartan  11/29-12/5: Blood pressure within acceptable range, outpatient follow-up with PCP     Type 2 diabetes  Carb controlled diet  Sliding scale  Hypoglycemia protocol  12/1-12/5: Increase Lantus to 10, continue sliding scale, and carb controlled diet.  12/5: Blood sugar low 200s, I have increased the Lantus to 12, continue to monitor POC    Morbid obesity  Outpatient follow-up     Bedbound  Wound on her left iliac area  Wound nurse consult       Procedures/Imaging:   Upon my review: Negative except discussed above         Progress Note/Physical Exam at Discharge     Subjective:Patient is stable for discharge.    Vitals:    08/10/22 0947 08/10/22 1200 08/10/22 1917 08/10/22 2045   BP: 115/64 134/71 106/62 118/71   Pulse: 87 83 94 95   Resp:   15    Temp:  97.7 F (36.5 C) 98.8 F (37.1 C)    TempSrc:  Oral Oral    SpO2:  99% 99%    Weight:       Height:               GEN APPEARANCE: Normal;  A&OX2, on room air  HEENT: Nonicteric  NECK: Supple;   CVS: RRR, S1, S2;   LUNGS: Decreased breath sound bases bilaterally with improvement towards apices  ABD: Soft; No TTP; + Normoactive BS  EXT: No edema;   NEURO: No focal deficits noted           Diagnostics     Labs/Studies Pending at Discharge: Yes outpatient EGD and colonoscopy    Last Labs   Recent Labs   Lab 08/10/22  0317 08/09/22  0450 08/07/22  0351 08/05/22  0253   WBC  --  8.85 9.09 9.67*   RBC  --  2.71* 2.49* 2.56*   Hgb 8.2* 8.4* 8.0*  8.3*   Hematocrit 26.8* 27.7* 25.3* 25.6*   MCV  --  102.2* 101.6* 100.0*   Platelets  --  336 356* 419*         Recent Labs   Lab 08/10/22  0317 08/09/22  0450 08/07/22  1146 08/04/22  1036   Sodium 135 137 140 141   Potassium 4.3 4.3 4.1 4.4   Chloride 100 101 103 104   CO2 '28 29 28 29   '$ BUN 26.0* 27.0* 26.0* 19.0   Creatinine 0.8 0.8 0.8 0.7   Glucose 297* 199* 160* 191*   Calcium 8.6 8.9 8.6 9.1   Magnesium 0.9*  --   --   --  Microbiology Results (last 15 days)       ** No results found for the last 360 hours. **             Patient Instructions   Discharge Diet: Heart healthy diet  Discharge Activity: As tolerated    Follow Up Appointment:   Follow-up Information       West Carbo, MD Follow up in 1 week(s).    Specialties: Gastroenterology, Internal Medicine  Contact information:  [place] [address]  [phone]               Tia Alert, MD Follow up in 1 week(s).    Specialties: Medical Oncology, Hematology  Contact information:  [address]  [place] [address]  [phone]               Vonzell Schlatter, MD Follow up in 1 week(s).    Specialties: Interventional Cardiology, Internal Medicine, Cardiology  Contact information:  [place]  [place] [address]  [phone]               Pcp, None, MD .                              Discharge Medications:     Medication List        START taking these medications      aspirin 81 MG chewable tablet  Chew 1 tablet (81 mg) by mouth daily     atorvastatin 40 MG tablet  Commonly known as: LIPITOR  Take 1 tablet (40 mg) by mouth nightly     carboxymethylcellulose 0.5 % ophthalmic solution  Commonly known as: REFRESH TEARS  Place 1 drop into both eyes 3 (three) times daily as needed (dry eye)     insulin glargine 100 UNIT/ML injection  Commonly known as: LANTUS  Inject 12 Units into the skin nightly     pantoprazole 40 MG tablet  Commonly known as: PROTONIX  Take 1 tablet (40 mg) by mouth 2 (two)  times daily for 30 days, THEN 1 tablet (40 mg) daily.  Start taking on: July 30, 2022            CONTINUE taking these medications      acetaminophen 500 MG tablet  Commonly known as: TYLENOL     CENTRUM SILVER 50+WOMEN PO     dabigatran 150 MG Caps  Commonly known as: PRADAXA     losartan 100 MG TABS, hydroCHLOROthiazide 25 MG TABS     metoprolol succinate 200 MG 24 hr tablet  Commonly known as: TOPROL-XL     TURMERIC PO     valACYclovir 1000 MG tablet  Commonly known as: VALTREX     vitamin D (ergocalciferol) 50000 UNIT Caps  Commonly known as: DRISDOL            STOP taking these medications      amLODIPine 5 MG tablet  Commonly known as: NORVASC     HUMULIN 70/30 KWIKPEN SC               Where to Get Your Medications        These medications were sent to [facility], [facility] - [address]  [address] [place] [address]      Phone: [phone]   aspirin 81 MG chewable tablet  atorvastatin 40 MG tablet  pantoprazole 40 MG tablet       Information about where to get these medications is not yet available    Ask your nurse or doctor about these medications  carboxymethylcellulose 0.5 % ophthalmic solution  insulin glargine 100 UNIT/ML injection          Time spent examining patient, discussing with patient/family regarding hospital course, chart review, reconciling medications and discharge planning: 40 minutes.    Signed,  Meredith Pel, MD    9:13 PM 08/10/2022

## 2022-08-11 LAB — BASIC METABOLIC PANEL
Anion Gap: 6 (ref 5.0–15.0)
BUN: 22 mg/dL — ABNORMAL HIGH (ref 7.0–21.0)
CO2: 29 mEq/L (ref 17–29)
Calcium: 8.6 mg/dL (ref 7.9–10.2)
Chloride: 102 mEq/L (ref 99–111)
Creatinine: 0.8 mg/dL (ref 0.4–1.0)
Glucose: 259 mg/dL — ABNORMAL HIGH (ref 70–100)
Potassium: 4.4 mEq/L (ref 3.5–5.3)
Sodium: 137 mEq/L (ref 135–145)
eGFR: >60 mL/min/{1.73_m2} (ref >=60)

## 2022-08-11 LAB — GLUCOSE WHOLE BLOOD - POCT
Whole Blood Glucose POCT: 218 mg/dL — ABNORMAL HIGH (ref 70–100)
Whole Blood Glucose POCT: 234 mg/dL — ABNORMAL HIGH (ref 70–100)
Whole Blood Glucose POCT: 299 mg/dL — ABNORMAL HIGH (ref 70–100)
Whole Blood Glucose POCT: 277 mg/dL — ABNORMAL HIGH (ref 70–100)

## 2022-08-11 LAB — MAGNESIUM: Magnesium: 1.7 mg/dL (ref 1.6–2.6)

## 2022-08-11 NOTE — Progress Notes (Incomplete)
NURSING SHIFT NOTE     Patient: Meredith Baker  Day: 18      SHIFT EVENTS     Shift Narrative/Significant Events (PRN med administration, fall, RRT, etc.):     Pt.is Oriented to person, place and situation, confused from time to time. However, calm and cooperative with care. Wound care performed and q2 repositioning due to perineal wounds. Foley inserted to help wound healing since pt is incontinent x 2. 500 ml UO.  Labs sent, electrolytes replaced.    Safety and fall precautions remain in place.          ASSESSMENT     Changes in assessment from patient's baseline this shift:    Neuro: No  CV: No  Pulm: No  Peripheral Vascular: No  HEENT: No  GI: No  BM during shift: Yes, Last BM: 12/14 BM X2   GU: Yes, foley inserted   Integ: No  MS: No    Pain: None  Pain Interventions: None      Mobility: PMP Activity: Step 3 - Bed Mobility of Distance Walked (ft) (Step 6,7): 0 Feet           Lines     Patient Lines/Drains/Airways Status       Active Lines, Drains and Airways       Name Placement date Placement time Site Days    Peripheral IV 08/10/22 22 G Left Antecubital 08/10/22  0550  Antecubital  less than 1    Urethral Catheter 08/10/22  2300  --  less than 1                         VITAL SIGNS     Vitals:    08/11/22 0317   BP: 118/70   Pulse: 90   Resp: 18   Temp: 98.2 F (36.8 C)   SpO2: 97%       Temp  Min: 97.7 F (36.5 C)  Max: 98.8 F (37.1 C)  Pulse  Min: 83  Max: 102  Resp  Min: 15  Max: 18  BP  Min: 104/68  Max: 147/74  SpO2  Min: 97 %  Max: 100 %      Intake/Output Summary (Last 24 hours) at 08/11/2022 0356  Last data filed at 08/10/2022 2300  Gross per 24 hour   Intake 300 ml   Output --   Net 300 ml            CARE PLAN

## 2022-08-11 NOTE — Consults (Signed)
Wound Ostomy Continence Consultation    Date Time: 08/11/22 4:31 PM  Patient Name: Meredith Baker, Meredith Baker  Consulting Service: [facility] Day: 20     Reason for Consult     Nurses called me last night:  She currently has many skin tears and moisture related wounds on the perineal area. She's been crying out for pain especially when we move her.    Assessment & Plan   Assessment:     Wound Type: Moisture Associated Skin Damage (MASD)    Location: perineal, sacral, buttocks, gluteal cleft, perianal, groin.   Irregular borders. Superficial scattered skin breakdown   Red, painful skin. Fissure/crack in the base of skin fold related to prolonged exposure to: Heat and Moisture,   Periwound/Edges: with Maceration lesion noted underneath.  Moist pink-red discoloration with some areas of tissue erosion.   No drainage  Pt  complain of pain/discomfort on assessment      Plan/Follow-Up:    Apply Venelex or Baza as scheduled and apply Triad every time patient gets turn    Wound care to skin sacrum, perineal, buttocks, gluteal cleft, perianal, groin:  1. Cleanse with Normal saline/ wound cleanser/peri-wipes and pat it dry.  2. Apply layer of Baza (miconazole) cream or Venelex every 6 hours and as needed (each scheduled q12).  3. Apply a layer of Triad (thickness of a dime) directly on open (bleeding/draining) wounds If Triad not sticking to wound bed, pat to dry and reapply.  4. Keep open to air. DO NOT USE FOAM DRESSING      Triad Hydrophilic Wound Dressing is a Zinc-oxide based hydrophilic paste for light-to- moderate levels of wound exudates. Helps maintain an optimal wound healing environment to facilitate natural autolytic debridement. Ideal alternative for difficult-to-dress areas and varying wound etiologies.    Baza Antifungal (miconazole nitrate 2%) is a moisture barrier cream that inhibits fungal growth and treats candidiasis, jock itch, ringworm, and athlete's foot - also provides a moisture barrier against urine  and feces. It has skin conditioners and is CHG compatible.    Venelex (balsam of [place]) is used to stimulate effective capillary bed action and increase circulation to wound and provide a moist environment.    Venelex And Katha Hamming are ordered through the pharmacy.  Triad comes from the supply rooms or central supply.    Wound photography:        Objective Findings   Braden: Braden Scale Score: 13 (08/11/22 0800)  Braden Subscales:  Sensory Perceptions: No impairment (08/11/22 0800)  Moisture: Occasionally moist (08/11/22 0800)  Activity: Bedfast (08/11/22 0800)  Mobility: Very limited (08/11/22 0800)  Nutrition: Probably inadequate (08/11/22 0800)  Friction and Shear: Problem (08/11/22 0800)    Ht Readings from Last 1 Encounters:   07/24/22 1.575 m ('5\' 2"'$ )     Wt Readings from Last 3 Encounters:   08/11/22 107.3 kg (236 lb 9.6 oz)     Body mass index is 43.27 kg/m.    Current Diet:   Diet consistent carbohydrate  Supervise For Meals Frequency: All meals  Ensure Plus High Protein Supplement Quantity: A. One; Flavor: Vanilla; Frequency: TID (3 times a day) with meals  Supervise For Meals Frequency: All meals     Specialty Bed: Centrella Mattress (Med-Surg) - Innovative support surfaces help manage pressure, shear and moisture to deliver optimal wound prevention and healing.      History of Present Illness   This is a 76 y.o. female  has a past medical history of Diabetes mellitus,  History of blood clots, and Hypertension..  Admitted with NSTEMI (non-ST elevated myocardial infarction).      History of Present Illness   Significant Lab Values:    Recent Labs     08/11/22  0226   Sodium 137   Potassium 4.4   Chloride 102   CO2 29   BUN 22.0*   Creatinine 0.8   Calcium 8.6   eGFR >60.0   Glucose 259*       Lab Results   Component Value Date    HGBA1C 6.0 (H) 07/26/2022    GLU 259 (H) 08/11/2022    LDL 42 07/26/2022    CREAT 0.8 08/11/2022     Recent Labs   Lab 08/11/22  1527 08/11/22  1126 08/11/22  0717 08/10/22  2100  08/10/22  1643 08/10/22  1202 08/10/22  0832 08/09/22  2106 08/09/22  1536 08/09/22  1114   Whole Blood Glucose POCT 299* 234* 218* 196* 143* 169* 219* 199* 303* 298*       Hesham Womac "Sunshine" Tiara Maultsby BSN, RN, WOCN  Wound, Ostomy, and Continence Nurse Coordinator  [facility]  [address], [place], [place] [address]  T [place] [phone]  [email or web address]

## 2022-08-11 NOTE — Discharge Summary (Signed)
[facility]      Patient: Meredith Baker  Admission Date: 07/23/2022   DOB: [birth year]  Discharge Date: 08/11/2022    MRN: [id number]  Discharge Attending:Gunda Maqueda Candiss Norse, MD     Referring Physician: Pcp, None, MD  PCP: Pcp, None, MD       DISCHARGE SUMMARY     Discharge Information   Admission Diagnosis:   NSTEMI (non-ST elevated myocardial infarction)    Discharge Diagnosis:   Megaloblastic anemia  Sepsis  UTI?  History of DVT  Elevated troponin  Hypertension  Type 2 diabetes with hyperglycemia  Morbid obesity  Hypercoagulable state  Left iliac wound    Admission Condition: Guarded  Discharge Condition: Stable and improved  Consultants: Cardiology/pathology and gastroenterology  Functional Status: Bedbound  Discharged to: SNF, awaiting placement     Hospital Course   Presentation History   76 years old female with history of hypertension, type 2 diabetes, hyperlipidemia, osteoarthritis, bedbound, history of bilateral DVT who presented for evaluation of generalized weakness presented for weakness and inability to care for herself admitted with a diagnosis of UTI and NSTEMI cardiology on board, Noted trended down H/H.    See HPI for details.    Hospital Course (18 Days)   August 10, 2022: Due to skin breakdown on the labia as noted by wound care, external urinary catheter was not a possibility.  Patient consistently was urinating on herself.  Place Foley catheter to help with the wounds heal around the groin.  Spoken to nursing.  Seen and examined today with nurse at bedside as well.  Was asleep on the first encounter however was able to be woken up later on as stated that she was doing fine.  Still awaiting placement.    August 11, 2022: Patient doing well, easily awoken.  States that she feels fine.  Still waiting SNF placement versus the alternative.  Case management spoken to.  Patient encouraged to participate with physical therapy.  Also encouraged to drink fluids      Anemia  Hemoglobin has been  dropping from  From 12-10--8.2  I have ordered H&H monitor every 6 hours  Fecal occult blood test  Given patient has history of DVT I will continue the anticoagulation for now  Fecal occult blood test  Will monitor closely  No obvious bleeding noted  11/29: So far H&H stable between 8-9, continue heparin drip, pending hematology evaluation) final recommendation, fecal occult blood test pending, patient had 1 bowel movement yesterday which is brown, no obvious bleeding noted  11/30: H&H stayed stable, patient switched back to Pradaxa per heme-onc recommendation, fecal occult blood test came positive, GI consulted to weigh in, started on PPI  12/1-12/5: Discussed with GI, recommended outpatient EGD colonoscopy and Protonix 40 mg twice daily for a month followed by daily, H&H remained stable in fact uptrending plan communicated with the patient as well as niece  08/09/2022: H&H largely stable, will continue following until the patient is discharged    Sepsis likely from UTI  Presented with leukocytosis and tachycardia  CT scan of the chest showed small left pleural effusion and evidence of pulmonary hypertension  UA consistent with UTI  Blood culture and urine culture came negative  Initially started on empirical broad-spectrum antibiotic vancomycin and Zosyn  MRSA test is negative  CT abdomen showed nonobstructive stone and small bladder stone  Antibiotic de-escalated to ceftriaxone  Leukocytosis improved  11/29-11/30: Patient will complete 5 days course of antibiotics today  08/09/2022: No symptoms of  UTI, leukocytosis resolved for several days now        History of DVT  Patient had bilateral lower extremity DVT back in September 2023 currently on Pradaxa  Repeat venous ultrasound showed left femoral popliteal DVT extending to the left peroneal vein and posterior tibialis vein with associated mild soft tissue swelling/edema and short segment nonocclusive thrombus within the right common femoral vein  I will involve  hematology to guide on treatment  11/29: CTA ruled out PE, for now we will continue heparin drip, will switch her back to [place] once hematology clears  11/30: Patient is a switch to Pradaxa after hematology cleared the patient  12/1-12/5: Patient tolerated Pradaxa, outpatient follow-up with hematology  08/09/2022: Tolerating Pradaxa    NSTEMI  Elevated troponin  Likely demand ischemia  Troponin trended up from 90-108  Patient is on aspirin, statin and Pradaxa  11/29-12/5: 2D echo showed normal EF, no LV wall motion abnormality, elevated troponin is likely from demand ischemia and anemia, continue aspirin/statin/Coreg/losartan.  Outpatient follow-up with cardiology  08/09/2022: No chest pain        Chronic stable medical condition  Hypertension: Continue amlodipine/metoprolol/losartan  11/29-12/5: Blood pressure within acceptable range, outpatient follow-up with PCP     Type 2 diabetes  Carb controlled diet  Sliding scale  Hypoglycemia protocol  12/1-12/5: Increase Lantus to 10, continue sliding scale, and carb controlled diet.  12/5: Blood sugar low 200s, I have increased the Lantus to 12, continue to monitor POC    Morbid obesity  Outpatient follow-up     Bedbound  Wound on her left iliac area  Wound nurse consult       Procedures/Imaging:   Upon my review: Negative except discussed above         Progress Note/Physical Exam at Discharge     Subjective:Patient is stable for discharge.    Vitals:    08/11/22 0715 08/11/22 0945 08/11/22 1128 08/11/22 1525   BP: 97/62 122/60 102/63 116/68   Pulse: 85 89 92 88   Resp: '16  16 16   '$ Temp: 98.6 F (37 C)  98.8 F (37.1 C) 98.4 F (36.9 C)   TempSrc: Oral  Oral Oral   SpO2: 96%  99% 100%   Weight: 107.3 kg (236 lb 9.6 oz)      Height:               GEN APPEARANCE: Normal;  A&OX2, on room air  HEENT: Nonicteric  NECK: Supple;   CVS: RRR, S1, S2;   LUNGS: Decreased breath sound bases bilaterally with improvement towards apices  ABD: Soft; No TTP; + Normoactive BS  EXT: No  edema;   NEURO: No focal deficits noted           Diagnostics     Labs/Studies Pending at Discharge: Yes outpatient EGD and colonoscopy    Last Labs   Recent Labs   Lab 08/10/22  0317 08/09/22  0450 08/07/22  0351 08/05/22  0253   WBC  --  8.85 9.09 9.67*   RBC  --  2.71* 2.49* 2.56*   Hgb 8.2* 8.4* 8.0* 8.3*   Hematocrit 26.8* 27.7* 25.3* 25.6*   MCV  --  102.2* 101.6* 100.0*   Platelets  --  336 356* 419*         Recent Labs   Lab 08/11/22  0226 08/10/22  0317 08/09/22  0450 08/07/22  1146   Sodium 137 135 137 140   Potassium 4.4 4.3  4.3 4.1   Chloride 102 100 101 103   CO2 '29 28 29 28   '$ BUN 22.0* 26.0* 27.0* 26.0*   Creatinine 0.8 0.8 0.8 0.8   Glucose 259* 297* 199* 160*   Calcium 8.6 8.6 8.9 8.6   Magnesium 1.7 0.9*  --   --          Microbiology Results (last 15 days)       ** No results found for the last 360 hours. **             Patient Instructions   Discharge Diet: Heart healthy diet  Discharge Activity: As tolerated    Follow Up Appointment:   Follow-up Information       West Carbo, MD Follow up in 1 week(s).    Specialties: Gastroenterology, Internal Medicine  Contact information:  [place] [address]  [phone]               Tia Alert, MD Follow up in 1 week(s).    Specialties: Medical Oncology, Hematology  Contact information:  [address]  [place] [address]  [phone]               Vonzell Schlatter, MD Follow up in 1 week(s).    Specialties: Interventional Cardiology, Internal Medicine, Cardiology  Contact information:  [place]  [place] [address]  [phone]               Pcp, None, MD .                              Discharge Medications:     Medication List        START taking these medications      aspirin 81 MG chewable tablet  Chew 1 tablet (81 mg) by mouth daily     atorvastatin 40 MG tablet  Commonly known as: LIPITOR  Take 1 tablet (40 mg) by mouth nightly     carboxymethylcellulose 0.5 % ophthalmic solution  Commonly  known as: REFRESH TEARS  Place 1 drop into both eyes 3 (three) times daily as needed (dry eye)     insulin glargine 100 UNIT/ML injection  Commonly known as: LANTUS  Inject 12 Units into the skin nightly     pantoprazole 40 MG tablet  Commonly known as: PROTONIX  Take 1 tablet (40 mg) by mouth 2 (two) times daily for 30 days, THEN 1 tablet (40 mg) daily.  Start taking on: July 30, 2022            CONTINUE taking these medications      acetaminophen 500 MG tablet  Commonly known as: TYLENOL     CENTRUM SILVER 50+WOMEN PO     dabigatran 150 MG Caps  Commonly known as: PRADAXA     losartan 100 MG TABS, hydroCHLOROthiazide 25 MG TABS     metoprolol succinate 200 MG 24 hr tablet  Commonly known as: TOPROL-XL     TURMERIC PO     valACYclovir 1000 MG tablet  Commonly known as: VALTREX     vitamin D (ergocalciferol) 50000 UNIT Caps  Commonly known as: DRISDOL            STOP taking these medications      amLODIPine 5 MG tablet  Commonly known as: NORVASC     HUMULIN 70/30 KWIKPEN SC  Where to Get Your Medications        These medications were sent to [facility] - [address]  [address], [place] [address]      Phone: [phone]   aspirin 81 MG chewable tablet  atorvastatin 40 MG tablet  pantoprazole 40 MG tablet       Information about where to get these medications is not yet available    Ask your nurse or doctor about these medications  carboxymethylcellulose 0.5 % ophthalmic solution  insulin glargine 100 UNIT/ML injection          Time spent examining patient, discussing with patient/family regarding hospital course, chart review, reconciling medications and discharge planning: 40 minutes.    Signed,  Meredith Pel, MD    5:32 PM 08/11/2022

## 2022-08-11 NOTE — Progress Notes (Signed)
Nutritional Support Services  Nutrition Follow-up    Meredith Baker 76 y.o. female   MRN: [id number]    Summary of Nutrition Recommendations:    Continue CCHO diet.  Continue Ensure Plus HP 1 bottle PO TID to optimize nutritional intake.  Each Ensure Plus High Protein provides 350 kcal and 20 gm protein.  Daily weights.   Consider adjusting sliding scale d/t high BG. Target BG range: 140-180 mg/dl     -----------------------------------------------------------------------------------------------------------------  D/w RN                                                       ASSESSMENT DATA     Subjective Nutrition: Patient seen at bedside, alert. Pt reports appetite is "tremendously better". Observed breakfast tray at bedside, 50% consumed, 100% Ensure consumed. Pt states she has been eating ~50% of meals and 2-3 Ensures/day. Per flowsheets, pt consuming ~66% of meals and ~85% of Ensures. Pt denies n/v/c/d, states "they are keeping me regular" with BM regimen. Pt denies chew/swallow difficulties, though prefers softer foods. NFPE conducted, mild muscle and fat wasting observed, d/w pt.      Learning Needs: Encouraged pt to continue optimizing nutritional intake via meals and ONS.     Events of Current Admission: 76 years old female with history of hypertension, type 2 diabetes, hyperlipidemia, osteoarthritis, bedbound, history of bilateral DVT who presented for evaluation of generalized weakness presented for weakness and inability to care for herself admitted with a diagnosis of UTI and NSTEMI. 12/12: no symptoms of UTI, no chest pain, tolerating Praxada. Awaiting discharge to SNF.     Medical Hx:  has a past medical history of Diabetes mellitus, History of blood clots, and Hypertension.     Orders Placed This Encounter   Procedures    Diet consistent carbohydrate    Ensure Plus High Protein Supplement Quantity: A. One; Flavor: Vanilla; Frequency: TID (3 times a day) with meals     Intake:  12/12: 50% x2, 75% x1 50%  Ensure x1, 75% Ensure x1   12/11: 25% x1, 100% Ensure x1  12/10: 100% Ensure x 1  12/9: 100% x2  12/8: 100% Ensure x1    ANTHROPOMETRIC  Height: 157.5 cm ('5\' 2"'$ )  Weight: 107.3 kg (236 lb 9.6 oz)  Weight Change: -0.44  Body mass index is 43.27 kg/m.      Weight Monitoring     Weight Weight Method   07/23/2022 100.336 kg  Bed Scale    07/24/2022 102.9 kg  Bed Scale     102.9 kg  Bed Scale     103.103 kg     07/27/2022 105.7 kg  Bed Scale    07/28/2022 109.589 kg  Bed Scale    07/29/2022 110 kg  Bed Scale    07/30/2022 106 kg  Bed Scale    07/31/2022 102 kg  Bed Scale    08/01/2022 104.7 kg  Bed Scale    08/02/2022 107 kg  Bed Scale    08/03/2022 110.8 kg  Bed Scale    08/04/2022 107.7 kg  Bed Scale    08/06/2022 106.1 kg  Bed Scale    08/08/2022 103.1 kg  Bed Scale    08/09/2022 104 kg  Bed Scale    08/10/2022 107.8 kg  Bed Scale    08/11/2022 107.321 kg  Bed Scale      Weight History Summary: Pt with fluctuating weight. Wt appears stable since last RD assessment.     ESTIMATED NEEDS    Total Daily Energy Needs: 1546.5 to 2062 kcal  Method for Calculating Energy Needs: 15 kcal - 20 kcal per kg  at 103.1 kg (Actual body weight)  Rationale: obese, noncritical     Total Daily Protein Needs: 78.3 to 104.4 g  Method for Calculating Protein Needs: 1.5 g - 2 g per kg at 52.2 kg (Ideal body weight)  Rationale: obese, noncritical    Total Daily Fluid Needs: 1148.4 to 1409.4 ml  Method for Calculating Fluid Needs: 22 ml - 27 ml  per kg at 52.2 kg (Ideal body weight)  Rationale: bmi, age, or per md    Pertinent Medications: lantus, SSI, protonix, miralax, pericolace    Pertinent labs:  Recent Labs   Lab 08/11/22  0226 08/10/22  0317 08/09/22  0450 08/07/22  1146 08/07/22  0351 08/05/22  0253 08/04/22  1036   Sodium 137 135 137 140  --   --  141   Potassium 4.4 4.3 4.3 4.1  --   --  4.4   Chloride 102 100 101 103  --   --  104   CO2 '29 28 29 28  '$ --   --  29   BUN 22.0* 26.0* 27.0* 26.0*  --   --  19.0   Creatinine 0.8 0.8 0.8 0.8  --    --  0.7   Glucose 259* 297* 199* 160*  --   --  191*   Calcium 8.6 8.6 8.9 8.6  --   --  9.1   Magnesium 1.7 0.9*  --   --   --   --   --    eGFR >60.0 >60.0 >60.0 >60.0  --   --  >60.0   WBC  --   --  8.85  --  9.09 9.67*  --    Hematocrit  --  26.8* 27.7*  --  25.3* 25.6*  --    Hgb  --  8.2* 8.4*  --  8.0* 8.3*  --      Physical Assessment: 08/11/22  Head: temple region: slight depression with decrease in muscle tone/resistance (mild muscle loss - temporalis), orbital region: slightly dark circles, somewhat hollow look, some decrease in bounce back of fat pads (mild fat loss), and buccal region: slight depression, somewhat sunken appearance, flat cheeks, decrease in bounce back of fat pads (mild fat loss)  Upper Body: clavicle bone region: some protrusion of the clavicle with decrease in muscle tone/resistance (mild muscle loss - pectoralis major), shoulder and acromion bone region: slight protrusion of acromion process, decrease in muscle tone/resistance (mild muscle loss - deltoid), upper arm region: ample fat tissue between fingers (no wasting observed), and dorsal hand region: muscle may bulge or be flat, no depression, feel muscle tone/resistance (no wasting observed)  Lower Body: no s/s subcutaneous fat or muscle loss and edema: mild pitting (1+), indentation lasts 0 to 15 seconds (mild edema - BLE)  Skin: abrasion left abdomen, cracking BLE, skin tear R buttock/perineum per flowsheets  GI function: WDL, LBM 12/14 per flowsheets  NUTRITION DIAGNOSIS     Moderate Malnutrition related to inadequate nutritional  intake in the setting of social/environmental circumstances as evidenced by <50% EER x 1 month, mild muscle losses (temporalis, pectoralis, deltoid) and mild fat losses (orbital, buccal) - active, updated                                                             INTERVENTION     Nutrition recommendation - Please refer to top of note                                                      MONITORING/EVALUATION     Goals:     1. Patient will consume >/=75% of nutritional needs via meals by next RD follow up - active     Nutrition Risk Level: Moderate (will follow up at least 1 time per week and PRN)     [facility] Dietetic Intern  [phone]

## 2022-08-11 NOTE — PT Progress Note (Signed)
Physical Therapy Treatment  Meredith Baker  Post Acute Care Therapy Recommendations   Discharge Recommendations:  SNF    DME needs IF patient is discharging home: Dressing stick, Long-handled sponge, Sock aid, Long-handled shoehorn, Hospital bed, Grab bars, BSC, Hoyer Lift    Therapy discharge recommendations may change with patient status.  Please refer to most recent note for up-to-date recommendations.    Unit: [facility]  Bed: [id number]    ___________________________________________________    Time of treatment:  PT Received On: 08/11/22  Start Time: 1125  Stop Time: 1141  Time Calculation (min): 16 min  Total Treatment Time (min): 8 (cotreat w/ OT)    Chart Review and Collaboration with Care Team: 5 minutes, not included in above time.    PT Visit Number: 6    ___________________________________________________    Precautions:   Precautions  Weight Bearing Status: no restrictions  Restricted BP Precautions: no R UE  Fall Risks: High, Impaired balance/gait, Impaired mobility, Muscle weakness  Skin breakdown present : Yes  Other Precautions: Bleeding & Sores    Personal Protective Equipment (PPE)  gloves and procedure mask    Updated X-Rays/Tests/Labs:  Lab Results   Component Value Date/Time    HGB 8.2 (L) 08/10/2022 03:17 AM    HCT 26.8 (L) 08/10/2022 03:17 AM    K 4.4 08/11/2022 02:26 AM    NA 137 08/11/2022 02:26 AM    INR 1.6 (H) 07/24/2022 01:17 AM    TROPI 103.4 (AA) 07/24/2022 06:38 AM    TROPI 108.5 (AA) 07/24/2022 12:26 AM    TROPI calc n/a 07/24/2022 12:26 AM    TROPI 92.0 (AA) 07/23/2022 04:48 PM       All imaging reviewed, please see chart for details.      Subjective:  "They did a surgery on me yesterday and I'm having a lot of pain."    Pt referring to placing foley catheter      Patient Goal: get stronger    Pain Assessment  Pain Assessment: Wong-Baker FACES  Wong-Baker FACES Pain Rating: Hurts even more  POSS Score: Awake and Alert  Pain Location: Perineum  Pain Frequency:  Increases with movement  Effect of Pain on Daily Activities: severe  Pain Intervention(s): Medication (See eMAR)  Multiple Pain Sites: No           Patient's medical condition is appropriate for Physical Therapy intervention at this time.  Patient is agreeable to participation in the therapy session. Nursing clears patient for therapy.      Objective:  Observation of Patient/Vital Signs:  Blood pressure 102/63, pulse 92, temperature 98.8 F (37.1 C), temperature source Oral, resp. rate 16, height 1.575 m ('5\' 2"'$ ), weight 107.3 kg (236 lb 9.6 oz), SpO2 99 %.      Cognition/Neuro Status  Arousal/Alertness: Appropriate responses to stimuli  Attention Span: Appears intact  Orientation Level: Oriented X4  Memory: Appears intact  Following Commands: Follows all commands and directions without difficulty  Safety Awareness: independent  Insights: Fully aware of deficits;Educated in safety awareness  Problem Solving: Able to problem solve independently  Behavior: calm;cooperative;attentive  Motor Planning: decreased initiation;decreased processing speed  Coordination: [place] impaired  Hand Dominance: right handed    Musculoskeletal Examination  Gross ROM  Right Upper Extremity ROM: within functional limits  Left Upper Extremity ROM: within functional limits  Right Lower Extremity ROM: needs focused assessment  Left Lower Extremity ROM: needs focused assessment         Functional  Mobility  Supine to Sit: Maximal Assist;Increased Effort;Increased Time;HOB raised (x2)  Scooting to HOB: Dependent (x2)  Scooting to EOB: Unable to assess (Comment) (inc pain)  Sit to Supine: Maximal Assist (x2)  Sit to Stand: Unable to assess (Comment) (poor sitting balance, requiring inc assistance, pt orthostatic)  Transfers  Bed to Chair: Unable to assess (Comment)  Locomotion  Ambulation: Unable to assess (Comment)  Distance Walked (ft) (Step 6,7): 0 Feet    Educated the Patient to role of physical therapy, plan of care, goals of therapy and  safety with mobility and ADLs with verbalized understanding  and demonstrated understanding.    Patient left in bed with alarm and all other medical equipment in place and call bell and all personal items/needs within reach.  RN notified of session outcome. CM team and attending have been previously notified of d/c recommendation; no updates to d/c recommendation since that time.      Assessment:  Session completed w/ OT for patient and therapist safety. Encouraged pt to transfer from supine>sit w/ max A x2. Pt hesitant 2/2 pain experienced in peri area when transferring. Once sitting EOB pt required Mod-max A to scoot EOB. At times only required CGA for sitting balance. Pt noted to have incr pain in sitting in peri area, tearfully requested to return to lying supine. Assisted back to supine. All needs in reach. Pt would continue to benefit from PT to improve functional mobility and prepare for safe d/c.      PMP Activity: Step 4 - Dangle at Bedside  Distance Walked (ft) (Step 6,7): 0 Feet    Plan:  Treatment/Interventions: Exercise, Neuromuscular re-education, Functional transfer training, LE strengthening/ROM, Bed mobility, Compensatory technique education      PT Frequency: 2-3x/wk   Continue plan of care.    Goals:  Goals  Goal Formulation: With patient  Time for Goal Acheivement: By time of discharge  Goals: Select goal  Pt Will Roll Right: with minimal assist, to maximize functional mobility and independence, Partly met  Pt Will Go Supine To Sit: with moderate assist, to maximize functional mobility and independence, Partly met  Pt Will Achieve Sitting Balance: 3/5 sits without UE support up to 30 seconds, to maximize functional mobility and independence, Partly met  Pt Will Perform Home Exer Program: independent, to maximize functional mobility and independence, Partly met      Rozelle Logan, PT, DPT  08/11/2022  11:54 AM    Physical Therapist  Physical Medicine and Rehabilitation  [place], [place], Thurs  8-4:30pm  [place] Fri 12:30-9:00pm  [facility]  Patient: Meredith Baker MRN#: [id number]  Unit: [place] Bed: [id number]

## 2022-08-11 NOTE — Plan of Care (Signed)
NURSING SHIFT NOTE     Patient: Meredith Baker  Day: 18      SHIFT EVENTS     Shift Narrative/Significant Events (PRN med administration, fall, RRT, etc.):     Patient is alert and oriented 2-3 and on room air.  Accu-checks and wound care completed.  Patient has no current complaints of pain or discomfort.     Plan of care ongoing. Safety and fall precautions remain in place. Purposeful rounding completed.          ASSESSMENT     Changes in assessment from patient's baseline this shift:    Neuro: No  CV: No  Pulm: No  Peripheral Vascular: No  HEENT: No  GI: No  BM during shift: No, Last BM: Last BM Date: 08/11/22  GU: No   Integ: No  MS: No    Pain: None      Mobility: PMP Activity: Step 4 - Dangle at Bedside of Distance Walked (ft) (Step 6,7): 0 Feet           Lines     Patient Lines/Drains/Airways Status       Active Lines, Drains and Airways       Name Placement date Placement time Site Days    Peripheral IV 08/10/22 22 G Left Antecubital 08/10/22  0550  Antecubital  1    Urethral Catheter 08/10/22  2300  --  less than 1                         VITAL SIGNS     Vitals:    08/11/22 1525   BP: 116/68   Pulse: 88   Resp: 16   Temp: 98.4 F (36.9 C)   SpO2: 100%       Temp  Min: 98.2 F (36.8 C)  Max: 98.8 F (37.1 C)  Pulse  Min: 85  Max: 102  Resp  Min: 15  Max: 18  BP  Min: 97/62  Max: 122/60  SpO2  Min: 96 %  Max: 100 %      Intake/Output Summary (Last 24 hours) at 08/11/2022 1553  Last data filed at 08/11/2022 1128  Gross per 24 hour   Intake 250 ml   Output 500 ml   Net -250 ml              CARE PLAN       Problem: Moderate/High Fall Risk Score >5  Goal: Patient will remain free of falls  Flowsheets (Taken 08/11/2022 1551)  Moderate Risk (6-13):   MOD-Apply bed exit alarm if patient is confused   MOD-Floor mat at bedside (where available) if appropriate   MOD-Remain with patient during toileting   MOD-Consider a move closer to [place]   MOD-Re-orient confused patients   MOD-Perform dangle, stand, walk  (DSW) prior to mobilization   MOD-Request PT/OT consult order for patients with gait/mobility impairment   MOD-Use gait belt when appropriate   MOD-include family in multidisciplinary POC discussions     Problem: Compromised Tissue integrity  Goal: Damaged tissue is healing and protected  Flowsheets (Taken 08/11/2022 1551)  Damaged tissue is healing and protected:   Monitor/assess Braden scale every shift   Provide wound care per wound care algorithm   Reposition patient every 2 hours and as needed unless able to reposition self   Increase activity as tolerated/progressive mobility   Relieve pressure to bony prominences for patients at moderate and high risk  Avoid shearing injuries   Keep intact skin clean and dry   Use bath wipes, not soap and water, for daily bathing   Monitor external devices/tubes for correct placement to prevent pressure, friction and shearing   Use incontinence wipes for cleaning urine, stool and caustic drainage. Foley care as needed   Monitor patient's hygiene practices   Encourage use of lotion/moisturizer on skin   Consult/collaborate with wound care nurse   Consider placing an indwelling catheter if incontinence interferes with healing of stage 3 or 4 pressure injury   Utilize specialty bed  Goal: Nutritional status is improving  Flowsheets (Taken 08/11/2022 1551)  Nutritional status is improving:   Allow adequate time for meals   Assist patient with eating   Encourage patient to take dietary supplement(s) as ordered   Collaborate with Clinical Nutritionist   Include patient/patient care companion in decisions related to nutrition     Problem: Safety  Goal: Patient will be free from injury during hospitalization  Flowsheets (Taken 08/11/2022 1551)  Patient will be free from injury during hospitalization:   Provide and maintain safe environment   Assess patient's risk for falls and implement fall prevention plan of care per policy   Use appropriate transfer methods   Ensure appropriate  safety devices are available at the bedside   Include patient/ family/ care giver in decisions related to safety   Assess for patients risk for elopement and implement [place] per policy   Hourly rounding   Provide alternative method of communication if needed (communication boards, writing)     Problem: Fluid and Electrolyte Imbalance/ Endocrine  Goal: Fluid and electrolyte balance are achieved/maintained  Flowsheets (Taken 08/11/2022 1551)  Fluid and electrolyte balance are achieved/maintained:   Observe for cardiac arrhythmias   Monitor for muscle weakness   Monitor/assess lab values and report abnormal values   Assess and reassess fluid and electrolyte status     Problem: Hemodynamic Status: Cardiac  Goal: Stable vital signs and fluid balance  Flowsheets (Taken 08/11/2022 1551)  Stable vital signs and fluid balance:   Assess signs and symptoms associated with cardiac rhythm changes   Monitor lab values     Problem: Diabetes: Glucose Imbalance  Goal: Blood glucose stable at established goal  Flowsheets (Taken 08/11/2022 1551)  Blood glucose stable at established goal:   Monitor lab values   Monitor intake and output.  Notify LIP if urine output is < 30 mL/hour.   Follow fluid restrictions/IV/PO parameters   Assess for hypoglycemia /hyperglycemia   Include patient/family in decisions related to nutrition/dietary selections   Monitor/assess vital signs   Coordinate medication administration with meals, as indicated   Ensure appropriate diet and assess tolerance   Ensure adequate hydration   Ensure appropriate consults are obtained (Nutrition, Diabetes Education, and Case Management)   Ensure patient/family has adequate teaching materials

## 2022-08-11 NOTE — OT Progress Note (Signed)
Occupational Therapy Note    Occupational Therapy Treatment  [place] Acute Care Therapy Recommendations   Discharge Recommendations:  SNF    DME needs IF patient is discharging home: Hospital bed, [facility] Lift    Therapy discharge recommendations may change with patient status.  Please refer to most recent note for up-to-date recommendations.    Unit: [facility]  Bed: [id number]    ___________________________________________________    Time of treatment:  OT Received On: 08/11/22  Start Time: 1125  Stop Time: 1141  Time Calculation (min): 16 min  Total Treatment Time (min): 8 (Split with PT to maximize safety & potential)    Chart Review and Collaboration with Care Team: 5 minutes, not included in above time.    OT Visit Number: 7      Precautions and Contraindications:    Precautions  Weight Bearing Status: no restrictions  Restricted BP Precautions: no R UE  Skin breakdown present : Yes  Other Precautions: Bleeding & Sores    Personal Protective Equipment (PPE)  gloves and procedure mask    Updated Labs:  Lab Results   Component Value Date/Time    HGB 8.2 (L) 08/10/2022 03:17 AM    HCT 26.8 (L) 08/10/2022 03:17 AM    K 4.4 08/11/2022 02:26 AM    NA 137 08/11/2022 02:26 AM    INR 1.6 (H) 07/24/2022 01:17 AM    TROPI 103.4 (AA) 07/24/2022 06:38 AM    TROPI 108.5 (AA) 07/24/2022 12:26 AM    TROPI calc n/a 07/24/2022 12:26 AM    TROPI 92.0 (AA) 07/23/2022 04:48 PM       All imaging reviewed, please see chart for details.    Subjective:    "I can't do this."  Patient Goal: "sit up"    Patient's medical condition is appropriate for Occupational Therapy intervention at this time.  Patient is agreeable to participation in the therapy session. Nursing clears patient for therapy.  Pain Assessment  Pain Assessment: Wong-Baker FACES  Wong-Baker FACES Pain Rating: Hurts even more  Pain Location: Back;Knee;Leg  Pain Intervention(s): Repositioned;Ambulation/increased  activity;Elevated;Rest;Therapeutic presence      Objective:  Observation of Patient/Vital Signs:    SBP 109 supine  SBP 89/51 supine after sitting EOB    Cognition/Neuro Status  Arousal/Alertness: Appropriate responses to stimuli  Attention Span: Appears intact  Orientation Level: Oriented X4  Memory: Appears intact  Following Commands: Follows all commands and directions without difficulty  Safety Awareness: independent  Insights: Fully aware of deficits;Educated in safety awareness  Problem Solving: Able to problem solve independently  Behavior: calm;cooperative;attentive  Motor Planning: decreased initiation;decreased processing speed  Coordination: [place] impaired  Hand Dominance: right handed    Functional Mobility  Supine to Sit Transfers: Maximal Assist (x2)  Sit to Supine Transfers: Dependent (x2)  Sit to Stand Transfers: not tested (2/2 low BP, decreased sitting toleration)    Self-care and Home Management  Eating:  (pt deferred)  Grooming:  (pt deferred)  LB Dressing: Dependent  Toileting: Dependent (foley cath)      Educated the Patient to role of occupational therapy, plan of care, goals of therapy and safety with mobility and ADLs with verbalized understanding .     Patient left in bed with alarm and all other medical equipment in place and call bell and all personal items/needs within reach.  RN notified of session outcome.  CM team and attending have been previously notified of  d/c recommendation; no updates to d/c recommendation since that time.      Assessment:  Pt with slow progression towards OT goals 2/2 decreased strength & endurance. Instructing hand positioning for assist for bed mobility, pt difficulty with adduction of LUE to grasp bar needing A. Facilitating hip positioning EOB, pt sitting balance poor/fair. Pt urgently requesting return to supine given pain.  Continue to rec inpatient OT and SNF upon d/c.          PMP Activity: Step 4 - Dangle at Bedside  Distance Walked (ft) (Step 6,7): 0  Feet      Plan:  OT Frequency Recommended: 2-3x/wk  Goal Formulation: Patient      Time For Goal Achievement: by time of discharge  ADL Goals  Patient will feed self: Independent, Not met  Patient will groom self: Independent, Not met  Mobility and Transfer Goals  Other Goal: Patient will transitioned supine <> EOB c Min A; not met                       Continue plan of care.      [name], [place]  [phone]  Physical Medicine and [facility]    08/11/2022  11:49 AM    [facility]  Patient: Meredith Baker MRN#: [id number]   Unit: [place] [facility] Bed: [id number]

## 2022-08-12 LAB — GLUCOSE WHOLE BLOOD - POCT
Whole Blood Glucose POCT: 166 mg/dL — ABNORMAL HIGH (ref 70–100)
Whole Blood Glucose POCT: 248 mg/dL — ABNORMAL HIGH (ref 70–100)
Whole Blood Glucose POCT: 279 mg/dL — ABNORMAL HIGH (ref 70–100)
Whole Blood Glucose POCT: 309 mg/dL — ABNORMAL HIGH (ref 70–100)

## 2022-08-12 LAB — BASIC METABOLIC PANEL
Anion Gap: 5 (ref 5.0–15.0)
BUN: 22 mg/dL — ABNORMAL HIGH (ref 7.0–21.0)
CO2: 31 mEq/L — ABNORMAL HIGH (ref 17–29)
Calcium: 8.8 mg/dL (ref 7.9–10.2)
Chloride: 100 mEq/L (ref 99–111)
Creatinine: 0.8 mg/dL (ref 0.4–1.0)
Glucose: 168 mg/dL — ABNORMAL HIGH (ref 70–100)
Potassium: 4.2 mEq/L (ref 3.5–5.3)
Sodium: 136 mEq/L (ref 135–145)
eGFR: >60 mL/min/{1.73_m2} (ref >=60)

## 2022-08-12 LAB — MAGNESIUM: Magnesium: 1.7 mg/dL (ref 1.6–2.6)

## 2022-08-12 NOTE — Discharge Summary (Signed)
[facility]      Patient: Meredith Baker  Admission Date: 07/23/2022   DOB: 05-12-1946  Discharge Date: 08/12/2022    MRN: [id number]  Discharge Attending:Ernan Runkles Candiss Norse, MD     Referring Physician: Pcp, None, MD  PCP: Pcp, None, MD       DISCHARGE SUMMARY     Discharge Information   Admission Diagnosis:   NSTEMI (non-ST elevated myocardial infarction)    Discharge Diagnosis:   Megaloblastic anemia  Sepsis  UTI?  History of DVT  Elevated troponin  Hypertension  Type 2 diabetes with hyperglycemia  Morbid obesity  Hypercoagulable state  Left iliac wound    Admission Condition: Guarded  Discharge Condition: Stable and improved  Consultants: Cardiology/pathology and gastroenterology  Functional Status: Bedbound  Discharged to: SNF, awaiting placement     Hospital Course   Presentation History   76 years old female with history of hypertension, type 2 diabetes, hyperlipidemia, osteoarthritis, bedbound, history of bilateral DVT who presented for evaluation of generalized weakness presented for weakness and inability to care for herself admitted with a diagnosis of UTI and NSTEMI cardiology on board, Noted trended down H/H.    See HPI for details.    Hospital Course (19 Days)   August 10, 2022: Due to skin breakdown on the labia as noted by wound care, external urinary catheter was not a possibility.  Patient consistently was urinating on herself.  Place Foley catheter to help with the wounds heal around the groin.  Spoken to nursing.  Seen and examined today with nurse at bedside as well.  Was asleep on the first encounter however was able to be woken up later on as stated that she was doing fine.  Still awaiting placement.    August 11, 2022: Patient doing well, easily awoken.  States that she feels fine.  Still waiting SNF placement versus the alternative.  Case management spoken to.  Patient encouraged to participate with physical therapy.  Also encouraged to drink fluids    August 12, 2022:  Patient  continues to do well, [facility] lift documentation submitted.  Appears that the patient's placement is taking a long time to be approved.  Will likely be going home with family member after DME has been delivered with home health until skilled nursing facility is agreeable to accept the patient.  Still awaiting discharge.    Anemia  Hemoglobin has been dropping from  From 12-10--8.2  I have ordered H&H monitor every 6 hours  Fecal occult blood test  Given patient has history of DVT I will continue the anticoagulation for now  Fecal occult blood test  Will monitor closely  No obvious bleeding noted  11/29: So far H&H stable between 8-9, continue heparin drip, pending hematology evaluation) final recommendation, fecal occult blood test pending, patient had 1 bowel movement yesterday which is brown, no obvious bleeding noted  11/30: H&H stayed stable, patient switched back to Pradaxa per heme-onc recommendation, fecal occult blood test came positive, GI consulted to weigh in, started on PPI  12/1-12/5: Discussed with GI, recommended outpatient EGD colonoscopy and Protonix 40 mg twice daily for a month followed by daily, H&H remained stable in fact uptrending plan communicated with the patient as well as niece  08/09/2022: H&H largely stable, will continue following until the patient is discharged    Sepsis likely from UTI  Presented with leukocytosis and tachycardia  CT scan of the chest showed small left pleural effusion and evidence of pulmonary hypertension  UA consistent  with UTI  Blood culture and urine culture came negative  Initially started on empirical broad-spectrum antibiotic vancomycin and Zosyn  MRSA test is negative  CT abdomen showed nonobstructive stone and small bladder stone  Antibiotic de-escalated to ceftriaxone  Leukocytosis improved  11/29-11/30: Patient will complete 5 days course of antibiotics today  08/09/2022: No symptoms of UTI, leukocytosis resolved for several days now        History of  DVT  Patient had bilateral lower extremity DVT back in September 2023 currently on Pradaxa  Repeat venous ultrasound showed left femoral popliteal DVT extending to the left peroneal vein and posterior tibialis vein with associated mild soft tissue swelling/edema and short segment nonocclusive thrombus within the right common femoral vein  I will involve hematology to guide on treatment  11/29: CTA ruled out PE, for now we will continue heparin drip, will switch her back to [place] once hematology clears  11/30: Patient is a switch to Pradaxa after hematology cleared the patient  12/1-12/5: Patient tolerated Pradaxa, outpatient follow-up with hematology  08/09/2022: Tolerating Pradaxa    NSTEMI  Elevated troponin  Likely demand ischemia  Troponin trended up from 90-108  Patient is on aspirin, statin and Pradaxa  11/29-12/5: 2D echo showed normal EF, no LV wall motion abnormality, elevated troponin is likely from demand ischemia and anemia, continue aspirin/statin/Coreg/losartan.  Outpatient follow-up with cardiology  08/09/2022: No chest pain        Chronic stable medical condition  Hypertension: Continue amlodipine/metoprolol/losartan  11/29-12/5: Blood pressure within acceptable range, outpatient follow-up with PCP     Type 2 diabetes  Carb controlled diet  Sliding scale  Hypoglycemia protocol  12/1-12/5: Increase Lantus to 10, continue sliding scale, and carb controlled diet.  12/5: Blood sugar low 200s, I have increased the Lantus to 12, continue to monitor POC    Morbid obesity  Outpatient follow-up     Bedbound  Wound on her left iliac area  Wound nurse consult       Procedures/Imaging:   Upon my review: Negative except discussed above         Progress Note/Physical Exam at Discharge     Subjective:Patient is stable for discharge.    Vitals:    08/12/22 0821 08/12/22 0900 08/12/22 1201 08/12/22 1514   BP: 109/61 109/67 109/64 117/65   Pulse: 87  87 90   Resp: '16  16 16   '$ Temp: 98.1 F (36.7 C)  98.4 F (36.9 C)  98.6 F (37 C)   TempSrc: Oral  Oral Oral   SpO2: 98%  99% 96%   Weight: 105.7 kg (233 lb)      Height:               GEN APPEARANCE: Alert and oriented x 2, on room air, in good spirits  HEENT: Nonicteric  NECK: Supple;   CVS: RRR, S1, S2;   LUNGS: Decreased breath sounds at bases bilaterally improvement towards apices  ABD: Soft; No TTP; + Normoactive BS  EXT: No edema;  NEURO: No new focal deficits noted             Diagnostics     Labs/Studies Pending at Discharge: Yes outpatient EGD and colonoscopy    Last Labs   Recent Labs   Lab 08/10/22  0317 08/09/22  0450 08/07/22  0351   WBC  --  8.85 9.09   RBC  --  2.71* 2.49*   Hgb 8.2* 8.4* 8.0*   Hematocrit 26.8*  27.7* 25.3*   MCV  --  102.2* 101.6*   Platelets  --  336 356*         Recent Labs   Lab 08/12/22  0440 08/11/22  0226 08/10/22  0317 08/09/22  0450 08/07/22  1146   Sodium 136 137 135 137 140   Potassium 4.2 4.4 4.3 4.3 4.1   Chloride 100 102 100 101 103   CO2 31* '29 28 29 28   '$ BUN 22.0* 22.0* 26.0* 27.0* 26.0*   Creatinine 0.8 0.8 0.8 0.8 0.8   Glucose 168* 259* 297* 199* 160*   Calcium 8.8 8.6 8.6 8.9 8.6   Magnesium 1.7 1.7 0.9*  --   --          Microbiology Results (last 15 days)       ** No results found for the last 360 hours. **             Patient Instructions   Discharge Diet: Heart healthy diet  Discharge Activity: As tolerated    Follow Up Appointment:   Follow-up Information       West Carbo, MD Follow up in 1 week(s).    Specialties: Gastroenterology, Internal Medicine  Contact information:  [place] [address]  [phone]               Tia Alert, MD Follow up in 1 week(s).    Specialties: Medical Oncology, Hematology  Contact information:  [address]  [place] [address]  [phone]               Vonzell Schlatter, MD Follow up in 1 week(s).    Specialties: Interventional Cardiology, Internal Medicine, Cardiology  Contact information:  [place]  [address]  [phone]               Pcp, None, MD .                              Discharge Medications:     Medication List        START taking these medications      aspirin 81 MG chewable tablet  Chew 1 tablet (81 mg) by mouth daily     atorvastatin 40 MG tablet  Commonly known as: LIPITOR  Take 1 tablet (40 mg) by mouth nightly     carboxymethylcellulose 0.5 % ophthalmic solution  Commonly known as: REFRESH TEARS  Place 1 drop into both eyes 3 (three) times daily as needed (dry eye)     insulin glargine 100 UNIT/ML injection  Commonly known as: LANTUS  Inject 12 Units into the skin nightly     pantoprazole 40 MG tablet  Commonly known as: PROTONIX  Take 1 tablet (40 mg) by mouth 2 (two) times daily for 30 days, THEN 1 tablet (40 mg) daily.  Start taking on: July 30, 2022            CONTINUE taking these medications      acetaminophen 500 MG tablet  Commonly known as: TYLENOL     CENTRUM SILVER 50+WOMEN PO     dabigatran 150 MG Caps  Commonly known as: PRADAXA     losartan 100 MG TABS, hydroCHLOROthiazide 25 MG TABS     metoprolol succinate 200 MG 24 hr tablet  Commonly known as: TOPROL-XL     TURMERIC  PO     valACYclovir 1000 MG tablet  Commonly known as: VALTREX     vitamin D (ergocalciferol) 50000 UNIT Caps  Commonly known as: DRISDOL            STOP taking these medications      amLODIPine 5 MG tablet  Commonly known as: NORVASC     HUMULIN 70/30 KWIKPEN SC               Where to Get Your Medications        These medications were sent to [place], [place] - [address]  [address], [place] [address]      Phone: [phone]   aspirin 81 MG chewable tablet  atorvastatin 40 MG tablet  pantoprazole 40 MG tablet       Information about where to get these medications is not yet available    Ask your nurse or doctor about these medications  carboxymethylcellulose 0.5 % ophthalmic solution  insulin glargine 100 UNIT/ML injection          Time spent examining patient,  discussing with patient/family regarding hospital course, chart review, reconciling medications and discharge planning: 40 minutes.    Signed,  Meredith Pel, MD    4:15 PM 08/12/2022

## 2022-08-12 NOTE — Nursing Progress Note (Signed)
NURSING SHIFT NOTE     Patient: Meredith Baker  Day: 19      SHIFT EVENTS     Shift Narrative/Significant Events (PRN med administration, fall, RRT, etc.):     Patient alert and oriented but forgetful. Assisted with adls. Wound care done and medicated as due with no adverse reaction, blood sugars done and coverage given as needed. LVS stable.    Safety and fall precautions remain in place. Purposeful rounding completed.          ASSESSMENT     Changes in assessment from patient's baseline this shift:    Neuro: No  CV: No  Pulm: No  Peripheral Vascular: No  HEENT: No  GI: No  BM during shift: Yes   , Last BM: Last BM Date: 08/12/22  GU: No   Integ: No  MS: No    Pain: None  Pain Interventions:   Medications Utilized: none    Mobility: PMP Activity: Step 3 - Bed Mobility of Distance Walked (ft) (Step 6,7): 0 Feet           Lines     Patient Lines/Drains/Airways Status       Active Lines, Drains and Airways       Name Placement date Placement time Site Days    Peripheral IV 08/10/22 22 G Left Antecubital 08/10/22  0550  Antecubital  2    Urethral Catheter 08/10/22  2300  --  1                         VITAL SIGNS     Vitals:    08/12/22 2032   BP: 122/62   Pulse: (!) 102   Resp:    Temp:    SpO2:        Temp  Min: 98.1 F (36.7 C)  Max: 99.5 F (37.5 C)  Pulse  Min: 78  Max: 102  Resp  Min: 15  Max: 18  BP  Min: 109/64  Max: 130/68  SpO2  Min: 96 %  Max: 99 %      Intake/Output Summary (Last 24 hours) at 08/12/2022 2115  Last data filed at 08/12/2022 1700  Gross per 24 hour   Intake 1140 ml   Output 1410 ml   Net -270 ml            Attempted to wean?:      Symptoms:     Amount achieved on incentive spirometer?: No data recorded      CARE PLAN

## 2022-08-12 NOTE — Progress Notes (Addendum)
Update as follows:    CM facilitated a transparent conversation with the Pt's niece Ms. Jimmye Norman about an safe  and effective DCP. CM shared with Ms. Williams the Pt is not skill able and will not benefit from a SNF due to her baseline being bed bound. CM shared that the Pt can go home with a Aide provided by [facility] (Care management) for a few weeks then the Pt can bridge herself once the SCM is completed. Ms. Jimmye Norman was in agreement with the DCP. Cm contacted the Pt's rental office at [facility] and spoke with Abigail Butts [phone] (Hours of operation Mon-wed 9-5, Thursday closed, Friday 9-1) who shared she is willing to provide the Pt or a family member with a copy of her keys so she can have access to her apartment. CM referred the pt to [facility] for DME. CM was told by the Pt's niece Ms. Willis that she already obtains a hospital bed that needs repair. CM encouraged Ms. Willis to contact the provider to get the bed swapped out for one that is working.CM contacted Said from [place] [facility] to enquire about staff availability. Covering CM will follow.       Casey Burkitt, MSW, Supervisee in Social Work, Tourist information centre manager I

## 2022-08-12 NOTE — Plan of Care (Signed)
NURSING SHIFT NOTE     Patient: Meredith Baker  Day: 19      SHIFT EVENTS     Shift Narrative/Significant Events (PRN med administration, fall, RRT, etc.):     Assumed care of pt at 1900. Pt A/Ox 3, pt identifies self, place, and time. Pt reports 5/10 B feet pain, pt medicated with PRN tylenol. Pt has no complaints of SOB at this time. Pts wounds cleaned and ointment applied per orders. Pt medicated per MAR. Call bell within reach.   Safety and fall precautions remain in place. Purposeful rounding completed.          ASSESSMENT     Changes in assessment from patient's baseline this shift:    Neuro: No  CV: No  Pulm: No  Peripheral Vascular: No  HEENT: No  GI: No  BM during shift: No, Last BM: Last BM Date: 08/11/22  GU: No   Integ: No  MS: No    Pain: Improved  Pain Interventions: Medications  Medications Utilized: Tylenol PO     Mobility: PMP Activity: Step 3 - Bed Mobility of Distance Walked (ft) (Step 6,7): 0 Feet           Lines     Patient Lines/Drains/Airways Status       Active Lines, Drains and Airways       Name Placement date Placement time Site Days    Peripheral IV 08/10/22 22 G Left Antecubital 08/10/22  0550  Antecubital  1    Urethral Catheter 08/10/22  2300  --  1                         VITAL SIGNS     Vitals:    08/11/22 2316   BP: 123/56   Pulse: 87   Resp: 15   Temp: 98.8 F (37.1 C)   SpO2: 97%       Temp  Min: 98.2 F (36.8 C)  Max: 99 F (37.2 C)  Pulse  Min: 85  Max: 99  Resp  Min: 15  Max: 18  BP  Min: 97/62  Max: 123/56  SpO2  Min: 96 %  Max: 100 %      Intake/Output Summary (Last 24 hours) at 08/12/2022 0056  Last data filed at 08/11/2022 1700  Gross per 24 hour   Intake 640 ml   Output 550 ml   Net 90 ml            CARE PLAN        Problem: Pain interferes with ability to perform ADL  Goal: Pain at adequate level as identified by patient  08/12/2022 0053 by Prescott Gum, RN  Outcome: Progressing  Flowsheets (Taken 08/08/2022 1200 by Velva Harman, RN)  Pain at adequate level as  identified by patient:   Identify patient comfort function goal   Assess for risk of opioid induced respiratory depression, including snoring/sleep apnea. Alert healthcare team of risk factors identified.   Assess pain on admission, during daily assessment and/or before any "as needed" intervention(s)   Reassess pain within 30-60 minutes of any procedure/intervention, per Pain Assessment, Intervention, Reassessment (AIR) Cycle   Evaluate if patient comfort function goal is met   Evaluate patient's satisfaction with pain management progress   Offer non-pharmacological pain management interventions  08/12/2022 0051 by Prescott Gum, RN  Outcome: Progressing     Problem: Moderate/High Fall Risk Score >5  Goal: Patient will remain free of  falls  08/12/2022 0053 by Prescott Gum, RN  Outcome: Progressing  Flowsheets (Taken 08/11/2022 1551 by Arbutus Ped, RN)  Moderate Risk (6-13):   MOD-Apply bed exit alarm if patient is confused   MOD-Floor mat at bedside (where available) if appropriate   MOD-Remain with patient during toileting   MOD-Consider a move closer to [place]   MOD-Re-orient confused patients   MOD-Perform dangle, stand, walk (DSW) prior to mobilization   MOD-Request PT/OT consult order for patients with gait/mobility impairment   MOD-Use gait belt when appropriate   MOD-include family in multidisciplinary POC discussions  08/12/2022 0051 by Prescott Gum, RN  Outcome: Progressing     Problem: Compromised Tissue integrity  Goal: Damaged tissue is healing and protected  08/12/2022 0053 by Prescott Gum, RN  Outcome: Progressing  Jerome (Taken 08/11/2022 1551 by Arbutus Ped, RN)  Damaged tissue is healing and protected:   Monitor/assess Braden scale every shift   Provide wound care per wound care algorithm   Reposition patient every 2 hours and as needed unless able to reposition self   Increase activity as tolerated/progressive mobility   Relieve pressure to bony prominences for patients  at moderate and high risk   Avoid shearing injuries   Keep intact skin clean and dry   Use bath wipes, not soap and water, for daily bathing   Monitor external devices/tubes for correct placement to prevent pressure, friction and shearing   Use incontinence wipes for cleaning urine, stool and caustic drainage. Foley care as needed   Monitor patient's hygiene practices   Encourage use of lotion/moisturizer on skin   Consult/collaborate with wound care nurse   Consider placing an indwelling catheter if incontinence interferes with healing of stage 3 or 4 pressure injury   Utilize specialty bed  08/12/2022 0051 by Prescott Gum, RN  Outcome: Progressing  Goal: Nutritional status is improving  08/12/2022 0053 by Prescott Gum, RN  Outcome: Progressing  Flowsheets (Taken 08/11/2022 1551 by Arbutus Ped, RN)  Nutritional status is improving:   Allow adequate time for meals   Assist patient with eating   Encourage patient to take dietary supplement(s) as ordered   Collaborate with Clinical Nutritionist   Include patient/patient care companion in decisions related to nutrition  08/12/2022 0051 by Prescott Gum, RN  Outcome: Progressing     Problem: Compromised Moisture  Goal: Moisture level Interventions  08/12/2022 0053 by Prescott Gum, RN  Outcome: Progressing  Flowsheets (Taken 08/11/2022 2000)  Moisture level Interventions: Moisture wicking products, Moisture barrier cream  08/12/2022 0051 by Prescott Gum, RN  Outcome: Progressing     Problem: Compromised Activity/Mobility  Goal: Activity/Mobility Interventions  08/12/2022 0053 by Prescott Gum, RN  Outcome: Progressing  Flowsheets (Taken 08/11/2022 2000)  Activity/Mobility Interventions: Pad bony prominences, TAP Seated positioning system when OOB, Promote PMP, Reposition q 2 hrs / turn clock, Offload heels (boots or pillows)  08/12/2022 0051 by Prescott Gum, RN  Outcome: Progressing     Problem: Compromised Nutrition  Goal: Nutrition Interventions  08/12/2022  0053 by Prescott Gum, RN  Outcome: Progressing  Flowsheets (Taken 08/11/2022 2000)  Nutrition Interventions: Discuss nutrition at MDR, I&Os document % meal eaten, Daily weights  08/12/2022 0051 by Prescott Gum, RN  Outcome: Progressing     Problem: Compromised Friction/Shear  Goal: Friction and Shear Interventions  08/12/2022 0053 by Prescott Gum, RN  Outcome: Progressing  Flowsheets (Taken 08/11/2022 2000)  Friction and Shear Interventions: HOB 30 degrees or less, Pad bony prominences/Heel Foam, TAP Seated  positioning system when OOB, Off load heels  08/12/2022 0051 by Prescott Gum, RN  Outcome: Progressing     Problem: Safety  Goal: Patient will be free from injury during hospitalization  08/12/2022 0053 by Prescott Gum, RN  Outcome: Progressing  Flowsheets (Taken 08/11/2022 1551 by Arbutus Ped, RN)  Patient will be free from injury during hospitalization:   Provide and maintain safe environment   Assess patient's risk for falls and implement fall prevention plan of care per policy   Use appropriate transfer methods   Ensure appropriate safety devices are available at the bedside   Include patient/ family/ care giver in decisions related to safety   Assess for patients risk for elopement and implement [place] per policy   Hourly rounding   Provide alternative method of communication if needed (communication boards, writing)  08/12/2022 0051 by Prescott Gum, RN  Outcome: Progressing  Goal: Patient will be free from infection during hospitalization  08/12/2022 0053 by Prescott Gum, RN  Outcome: Progressing  Flowsheets (Taken 08/08/2022 1650 by Velva Harman, RN)  Free from Infection during hospitalization:   Assess and monitor for signs and symptoms of infection   Monitor lab/diagnostic results   Encourage patient and family to use good hand hygiene technique   Monitor all insertion sites (i.e. indwelling lines, tubes, urinary catheters, and drains)  08/12/2022 0051 by Prescott Gum,  RN  Outcome: Progressing     Problem: Fluid and Electrolyte Imbalance/ Endocrine  Goal: Fluid and electrolyte balance are achieved/maintained  08/12/2022 0053 by Prescott Gum, RN  Outcome: Progressing  Flowsheets (Taken 08/11/2022 1551 by Arbutus Ped, RN)  Fluid and electrolyte balance are achieved/maintained:   Observe for cardiac arrhythmias   Monitor for muscle weakness   Monitor/assess lab values and report abnormal values   Assess and reassess fluid and electrolyte status  08/12/2022 0051 by Prescott Gum, RN  Outcome: Progressing  Goal: Adequate hydration  08/12/2022 0053 by Prescott Gum, RN  Outcome: Progressing  Flowsheets (Taken 08/08/2022 1650 by Velva Harman, RN)  Adequate hydration:   Assess mucus membranes, skin color, turgor, perfusion and presence of edema   Monitor and assess vital signs and perfusion   Assess for peripheral, sacral, periorbital and abdominal edema  08/12/2022 0051 by Prescott Gum, RN  Outcome: Progressing     Problem: Diabetes: Glucose Imbalance  Goal: Blood glucose stable at established goal  08/12/2022 0053 by Prescott Gum, RN  Outcome: Progressing  Flowsheets (Taken 08/11/2022 1551 by Arbutus Ped, RN)  Blood glucose stable at established goal:   Monitor lab values   Monitor intake and output.  Notify LIP if urine output is < 30 mL/hour.   Follow fluid restrictions/IV/PO parameters   Assess for hypoglycemia /hyperglycemia   Include patient/family in decisions related to nutrition/dietary selections   Monitor/assess vital signs   Coordinate medication administration with meals, as indicated   Ensure appropriate diet and assess tolerance   Ensure adequate hydration   Ensure appropriate consults are obtained (Nutrition, Diabetes Education, and Case Management)   Ensure patient/family has adequate teaching materials  08/12/2022 0051 by Prescott Gum, RN  Outcome: Progressing     Problem: Hemodynamic Status: Cardiac  Goal: Stable vital signs and fluid  balance  08/12/2022 0053 by Prescott Gum, RN  Outcome: Progressing  Flowsheets (Taken 08/11/2022 1551 by Arbutus Ped, RN)  Stable vital signs and fluid balance:   Assess signs and symptoms associated with cardiac rhythm changes   Monitor lab values  08/12/2022 0051 by  Prescott Gum, RN  Outcome: Progressing     Problem: Bladder/Voiding  Goal: Patient will experience proper bladder emptying during admission and remain free from infection  08/12/2022 0053 by Prescott Gum, RN  Outcome: Progressing  Flowsheets (Taken 08/08/2022 1650 by Velva Harman, RN)  Patient will experience proper bladder emptying during admission and remain free from infection: Encourage patient to identify medications that aid bladder function prior to administration  08/12/2022 0051 by Prescott Gum, RN  Outcome: Progressing

## 2022-08-12 NOTE — Progress Notes (Signed)
Hoyer lift:     Meredith Baker needs the help of at least two people to be safely transferred from bed to a chair, wheelchair or commode. The patient would be confined to the bed without the use of a patient lift.

## 2022-08-12 NOTE — Progress Notes (Signed)
Update as follows:    Cm received a call from the Pt's niece Ms. Jimmye Norman who shared she is willing to allow her aunt go to a SNF. Cm updated referral and inquired about bed availability. CM is awaiting call back from future care [facility]. Cm will continue to follow.    Casey Burkitt, MSW, Supervisee in Social Work, Tourist information centre manager I

## 2022-08-12 NOTE — Progress Notes (Signed)
Home Health face-to-face (FTF) Encounter (Order [id number])  Consult  Date: 08/12/2022 Department: [facility] Ordering/Authorizing: Meredith Pel, MD     Order Information    Order Date/Time Release Date/Time Start Date/Time End Date/Time   08/12/22 02:36 PM None 08/12/22 02:34 PM 08/12/22 02:34 PM     Order Details    Frequency Duration Priority Order Class   Once 1  occurrence Routine Hospital Performed     Standing Order Information    Remaining Occurrences Interval Last Released     0/1 Once 08/12/2022              Provider Information    Ordering User Ordering Provider Authorizing Provider   Quentin Ore, Dyanne Yorks, RN Meredith Pel, MD Meredith Pel, MD   Attending Provider(s) Admitting Provider PCP   Eustace Pen, MD PhD; Charmaine Downs, MD; Jerelyn Scott, MD; Arley Phenix, MD; Danelle Earthly, MD; Meredith Pel, MD Charmaine Downs, MD Pcp, None, MD     Verbal Order Info    Action Created on Order Mode Entered by Responsible Provider Signed by Signed on   Ordering 08/12/22 1436 Per protocol: cosign required Quentin Ore, Annia Friendly, RN Meredith Pel, MD Meredith Pel, MD 08/12/22 1457           Comments    [place]oyer lift needed >99 months, NPI: [id number]    Ht: 1.575 m ('5\' 2"'$ )  Wt: 105.7 kg (233 lb)     NSTEMI (non-ST elevated myocardial infarction) I21.4                Home Health face-to-face (FTF) Encounter: Patient Communication     Not Released  Not seen         Order Questions    Question Answer Comment   Date I saw the patient face-to-face: 08/12/2022    Evidence this patient is homebound because: O.  N/A DME only    Medical conditions that necessitate Home Health care: M.  N/A DME only. No skilled services needed    Per clinical findings, following services are medically necessary: DME    DME Other (add in comment) [employer] fast streamer   Other (please specify) See comment for orders/  Research officer, trade union Instructions    Please select Home Care  Services medically necessary.    Based on the above findings, I certify that this patient is confined to the home and needs intermittent skilled nursing care, physical therapry and / or speech therapy or continues to need occupational therapy. The patient is under my care, and I have initiated the establishment of the plan of care. This patient will be followed by a physician who will periodically review the plan of care.     Collection Information            Consult Order Info    ID Description Priority Start Date Start Time   [id number] [place] face-to-face (FTF) Encounter Routine 08/12/2022  2:34 PM   Provider Specialty Referred to   ______________________________________ _____________________________________         Acknowledgement Info    For At Acknowledged By Acknowledged On   Placing Order 08/12/22 [place], Malva Cogan, RN 08/12/22 1442                     Verbal Order Info    Action Created on Order Mode Entered by  Responsible Provider Signed by Signed on   Ordering 08/12/22 1436 Per protocol: cosign required Quentin Ore, Annia Friendly, RN Meredith Pel, MD Meredith Pel, MD 08/12/22 [phone]           Patient Information    Patient Name  Meredith Baker Legal Sex  Female DOB  23-Jul-1946       Reprint Order [facility] face-to-face (FTF) Encounter (Order [id number]) on 08/12/22       Additional Information    Associated Reports External References   Priority and Order Details [facility]        Patient Name: Meredith Baker, Meredith Baker     MRN: [id number]      CSN: [id number]         [place] #   [id number] Patient Class   Inpatient Service  Medicine Accommodation Code  Intermediate Care      Admission Information     Admitting Physician:  Attending Physician: Charmaine Downs, MD  Meredith Pel, MD Unit  [facility] L&D Status      Admitting Diagnosis: Failure to thrive in adult; NSTEMI (non-ST elevated myocardial infarction)*  Room / Bed  A2507/A2507-A L&D - Last Menstrual Cycle      Chief Complaint: Failure To Thrive     Admit Type:  Admit Date/Time:  Discharge Date/Time: Emergency  07/23/2022 / 1525   /  Length of Stay: 19 Days    L&D EDD   Estimated Date of Delivery: None noted.      Patient Information              Home Address: [address]  [place] [address] Employer:  Employer Address:       ,     Main Phone: [phone] Employer Phone:     SSN: [id number]       DOB: 1945/11/04 (78 yrs)       Sex: Female Primary Care Physician: Pcp, None, MD   Marital Status: Single Referring Physician:       No ref. provider found   Race: Black or African American       Ethnicity: Not of Hispanic/Latino/S*       Emergency Contacts  Name Home Phone Work Phone Mobile Phone Relationship Lgl Rachel Bo     [phone] Other No   [facility]     [id number] Relative           Guarantor Information     Guarantor Name: Meredith Baker, Meredith Baker ID: [id number]   Guarantor Relationship to Pt: Self Guarantor Type: Personal/Family   Guarantor DOB:    12-Jun-1946 Billing Indicator: Nym Unable to Code      Guarantor Address: [address] [place] [address]          Guarantor Home Phone: [phone] Guarantor Employer:        Guarantor Work Phone:   Special educational needs teacher Emp Phone:                     Chartered certified accountant     Insurance Name: [place] Name: [employer] Address:    [address] [place], [place] [id number] Subscriber DOB: 27-May-1946      Subscriber ID: [id number]   Insurance Phone: [phone] Pt Relationship to Sub:   Self   Insurance ID:  Group Name:   Preauthorization #: [id number]   Group #: [id number] Preauthorization Days:        Secondary Insurance     Insurance Name: - Subscriber Name:     Insurance Address:       ,   Veterinary surgeon DOB:        Subscriber ID:     Veterinary surgeon:   Pt Relationship to Sub:       Insurance ID:         Group Name:   Preauthorization #:      Group #:   Preauthorization Days:        [employer] Name: - Subscriber Name:     Insurance underwriter Address:       ,   Veterinary surgeon DOB:        Subscriber ID:     Veterinary surgeon:   Pt Relationship to Sub:       Insurance ID:         Group Name:   Preauthorization #:     Group #:   Preauthorization Days:           08/12/2022 3:46 PM

## 2022-08-12 NOTE — Plan of Care (Signed)
NURSING SHIFT NOTE     Patient: Meredith Baker  Day: 19      SHIFT EVENTS     Shift Narrative/Significant Events (PRN med administration, fall, RRT, etc.):     Assumed care of pt at 1900. Pt A/Ox3, pt identifies self, time, and place. Pt has no complaints of pain or SOB at this time. Pt medicated per MAR. Call bell within reach.   Safety and fall precautions remain in place. Purposeful rounding completed.          ASSESSMENT     Changes in assessment from patient's baseline this shift:    Neuro: No  CV: No  Pulm: No  Peripheral Vascular: No  HEENT: No  GI: No  BM during shift: No, Last BM: Last BM Date: 08/12/22  GU: No   Integ: No  MS: No    Pain: None      Mobility: PMP Activity: Step 3 - Bed Mobility of Distance Walked (ft) (Step 6,7): 0 Feet           Lines     Patient Lines/Drains/Airways Status       Active Lines, Drains and Airways       Name Placement date Placement time Site Days    Peripheral IV 08/10/22 22 G Left Antecubital 08/10/22  0550  Antecubital  2    Urethral Catheter 08/10/22  2300  --  1                         VITAL SIGNS     Vitals:    08/12/22 2032   BP: 122/62   Pulse: (!) 102   Resp:    Temp:    SpO2:        Temp  Min: 98.1 F (36.7 C)  Max: 99.5 F (37.5 C)  Pulse  Min: 78  Max: 102  Resp  Min: 15  Max: 18  BP  Min: 109/64  Max: 130/68  SpO2  Min: 96 %  Max: 99 %      Intake/Output Summary (Last 24 hours) at 08/12/2022 2242  Last data filed at 08/12/2022 1700  Gross per 24 hour   Intake 1140 ml   Output 1410 ml   Net -270 ml              CARE PLAN        Problem: Moderate/High Fall Risk Score >5  Goal: Patient will remain free of falls  Outcome: Progressing  Flowsheets (Taken 08/12/2022 1700 by Vickii Penna, RN)  Moderate Risk (6-13):   MOD-Perform dangle, stand, walk (DSW) prior to mobilization   MOD-Request PT/OT consult order for patients with gait/mobility impairment   MOD-Use gait belt when appropriate   MOD-include family in multidisciplinary POC discussions   MOD-  Consider video monitoring   LOW-Anticoagulation education for injury risk   MOD-Consider activation of bed alarm if appropriate   MOD-Apply bed exit alarm if patient is confused   MOD-Floor mat at bedside (where available) if appropriate   MOD-Consider a move closer to [place]   MOD-Remain with patient during toileting   MOD-Place bedside commode and assistive devices out of sight when not in use   MOD-Re-orient confused patients   MOD-Utilize diversion activities     Problem: Compromised Tissue integrity  Goal: Damaged tissue is healing and protected  Outcome: Progressing  Flowsheets (Taken 08/12/2022 0820 by Vickii Penna, RN)  Damaged tissue is healing  and protected: Monitor/assess Braden scale every shift  Goal: Nutritional status is improving  Outcome: Progressing  Flowsheets (Taken 08/12/2022 0820 by Vickii Penna, RN)  Nutritional status is improving: Allow adequate time for meals     Problem: Compromised Moisture  Goal: Moisture level Interventions  Outcome: Progressing  Flowsheets (Taken 08/12/2022 2000)  Moisture level Interventions: Moisture wicking products, Moisture barrier cream     Problem: Compromised Activity/Mobility  Goal: Activity/Mobility Interventions  Outcome: Progressing  Flowsheets (Taken 08/12/2022 2000)  Activity/Mobility Interventions: Pad bony prominences, TAP Seated positioning system when OOB, Promote PMP, Reposition q 2 hrs / turn clock, Offload heels (boots or pillows)     Problem: Compromised Nutrition  Goal: Nutrition Interventions  Outcome: Progressing  Flowsheets (Taken 08/12/2022 2000)  Nutrition Interventions: Discuss nutrition at MDR, I&Os document % meal eaten, Daily weights     Problem: Diabetes: Glucose Imbalance  Goal: Blood glucose stable at established goal  Outcome: Progressing  Flowsheets (Taken 08/12/2022 1702 by Vickii Penna, RN)  Blood glucose stable at established goal:   Monitor lab values   Include patient/family in decisions  related to nutrition/dietary selections   Monitor intake and output.  Notify LIP if urine output is < 30 mL/hour.   Assess for hypoglycemia /hyperglycemia   Monitor/assess vital signs   Ensure appropriate diet and assess tolerance   Coordinate medication administration with meals, as indicated   Ensure adequate hydration   Ensure appropriate consults are obtained (Nutrition, Diabetes Education, and Case Management)   Ensure patient/family has adequate teaching materials     Problem: Hemodynamic Status: Cardiac  Goal: Stable vital signs and fluid balance  Outcome: Progressing  Flowsheets (Taken 08/12/2022 1702 by Vickii Penna, RN)  Stable vital signs and fluid balance: Assess signs and symptoms associated with cardiac rhythm changes     Problem: Bladder/Voiding  Goal: Patient will experience proper bladder emptying during admission and remain free from infection  Outcome: Progressing  Flowsheets (Taken 08/12/2022 1702 by Vickii Penna, RN)  Patient will experience proper bladder emptying during admission and remain free from infection:   Apply urinary containment device as appropriate and/or per order   Utilize bladder scans prior to or post void as appropriate   Encourage patient to identify medications that aid bladder function prior to administration   Encourage bladder emptying at regular intervals   Assess need for indwelling catheter every shift and discuss with LIP

## 2022-08-12 NOTE — Plan of Care (Signed)
Problem: Pain interferes with ability to perform ADL  Goal: Pain at adequate level as identified by patient  Outcome: Progressing  Flowsheets (Taken 08/12/2022 1700)  Pain at adequate level as identified by patient:   Identify patient comfort function goal   Assess for risk of opioid induced respiratory depression, including snoring/sleep apnea. Alert healthcare team of risk factors identified.   Assess pain on admission, during daily assessment and/or before any "as needed" intervention(s)   Reassess pain within 30-60 minutes of any procedure/intervention, per Pain Assessment, Intervention, Reassessment (AIR) Cycle   Evaluate if patient comfort function goal is met   Evaluate patient's satisfaction with pain management progress   Offer non-pharmacological pain management interventions   Consult/collaborate with Pain Service   Consult/collaborate with Physical Therapy, Occupational Therapy, and/or Speech Therapy   Include patient/patient care companion in decisions related to pain management as needed     Problem: Side Effects from Pain Analgesia  Goal: Patient will experience minimal side effects of analgesic therapy  Outcome: Progressing  Flowsheets (Taken 08/12/2022 1700)  Patient will experience minimal side effects of analgesic therapy:   Monitor/assess patient's respiratory status (RR depth, effort, breath sounds)   Assess for changes in cognitive function   Prevent/manage side effects per LIP orders (i.e. nausea, vomiting, pruritus, constipation, urinary retention, etc.)   Evaluate for opioid-induced sedation with appropriate assessment tool (i.e. POSS)     Problem: Moderate/High Fall Risk Score >5  Goal: Patient will remain free of falls  Outcome: Progressing  Flowsheets (Taken 08/12/2022 1700)  Moderate Risk (6-13):   MOD-Perform dangle, stand, walk (DSW) prior to mobilization   MOD-Request PT/OT consult order for patients with gait/mobility impairment   MOD-Use gait belt when appropriate   MOD-include  family in multidisciplinary POC discussions   MOD- Consider video monitoring   LOW-Anticoagulation education for injury risk   MOD-Consider activation of bed alarm if appropriate   MOD-Apply bed exit alarm if patient is confused   MOD-Floor mat at bedside (where available) if appropriate   MOD-Consider a move closer to [place]   MOD-Remain with patient during toileting   MOD-Place bedside commode and assistive devices out of sight when not in use   MOD-Re-orient confused patients   MOD-Utilize diversion activities  VH Moderate Risk (6-13):   ALL REQUIRED LOW INTERVENTIONS   INITIATE YELLOW "FALL RISK" SIGNAGE   YELLOW NON-SKID SLIPPERS   YELLOW "FALL RISK" ARM BAND   USE OF BED EXIT ALARM IF PATIENT IS CONFUSED OR IMPULSIVE. PLACE RESET BED ALARM SIGN ABOVE BED   PLACE FALL RISK LEVEL ON WHITE BOARD FOR COMMUNICATION PURPOSES IN PATIENT'S ROOM   Include family/significant other in multidisciplinary discussion regarding plan of care as appropriate   Remain with patient during toileting   Request PT/OT therapy consult order from Physician for patients with gait/mobility impairment   Use assistive devices   Use chair-pad alarm device   Use of floor mat     Problem: Fluid and Electrolyte Imbalance/ Endocrine  Goal: Fluid and electrolyte balance are achieved/maintained  Outcome: Progressing  Flowsheets (Taken 08/12/2022 1702)  Fluid and electrolyte balance are achieved/maintained:   Monitor/assess lab values and report abnormal values   Assess and reassess fluid and electrolyte status   Monitor for muscle weakness     Problem: Diabetes: Glucose Imbalance  Goal: Blood glucose stable at established goal  Outcome: Progressing  Flowsheets (Taken 08/12/2022 1702)  Blood glucose stable at established goal:   Monitor lab values   Include  patient/family in decisions related to nutrition/dietary selections   Monitor intake and output.  Notify LIP if urine output is < 30 mL/hour.   Assess for hypoglycemia /hyperglycemia    Monitor/assess vital signs   Ensure appropriate diet and assess tolerance   Coordinate medication administration with meals, as indicated   Ensure adequate hydration   Ensure appropriate consults are obtained (Nutrition, Diabetes Education, and Case Management)   Ensure patient/family has adequate teaching materials     Problem: Bladder/Voiding  Goal: Patient will experience proper bladder emptying during admission and remain free from infection  Outcome: Progressing  Flowsheets (Taken 08/12/2022 1702)  Patient will experience proper bladder emptying during admission and remain free from infection:   Apply urinary containment device as appropriate and/or per order   Utilize bladder scans prior to or post void as appropriate   Encourage patient to identify medications that aid bladder function prior to administration   Encourage bladder emptying at regular intervals   Assess need for indwelling catheter every shift and discuss with LIP     Problem: Hemodynamic Status: Cardiac  Goal: Stable vital signs and fluid balance  Outcome: Progressing  Flowsheets (Taken 08/12/2022 1702)  Stable vital signs and fluid balance: Assess signs and symptoms associated with cardiac rhythm changes

## 2022-08-13 LAB — BASIC METABOLIC PANEL
Anion Gap: 7 (ref 5.0–15.0)
BUN: 23 mg/dL — ABNORMAL HIGH (ref 7.0–21.0)
CO2: 30 mEq/L — ABNORMAL HIGH (ref 17–29)
Calcium: 8.9 mg/dL (ref 7.9–10.2)
Chloride: 102 mEq/L (ref 99–111)
Creatinine: 0.8 mg/dL (ref 0.4–1.0)
Glucose: 235 mg/dL — ABNORMAL HIGH (ref 70–100)
Potassium: 4.2 mEq/L (ref 3.5–5.3)
Sodium: 139 mEq/L (ref 135–145)
eGFR: >60 mL/min/{1.73_m2} (ref >=60)

## 2022-08-13 LAB — GLUCOSE WHOLE BLOOD - POCT
Whole Blood Glucose POCT: 157 mg/dL — ABNORMAL HIGH (ref 70–100)
Whole Blood Glucose POCT: 241 mg/dL — ABNORMAL HIGH (ref 70–100)
Whole Blood Glucose POCT: 261 mg/dL — ABNORMAL HIGH (ref 70–100)
Whole Blood Glucose POCT: 222 mg/dL — ABNORMAL HIGH (ref 70–100)

## 2022-08-13 LAB — MAGNESIUM: Magnesium: 1.5 mg/dL — ABNORMAL LOW (ref 1.6–2.6)

## 2022-08-13 NOTE — Discharge Summary (Addendum)
[facility]      Patient: Meredith Baker  Admission Date: 07/23/2022   DOB: 1946/03/05  Discharge Date: 08/13/2022    MRN: [id number]  Discharge Attending:Chevon Laufer Candiss Norse, MD     Referring Physician: Pcp, None, MD  PCP: Pcp, None, MD       DISCHARGE SUMMARY     Discharge Information   Admission Diagnosis:   NSTEMI (non-ST elevated myocardial infarction)    Discharge Diagnosis:   Megaloblastic anemia  Sepsis  UTI?  History of DVT  Elevated troponin  Hypertension  Type 2 diabetes with hyperglycemia  Morbid obesity  Hypercoagulable state  Left iliac wound    Admission Condition: Guarded  Discharge Condition: Stable and improved  Consultants: Cardiology/pathology and gastroenterology  Functional Status: Bedbound  Discharged to: SNF, awaiting placement     Hospital Course   Presentation History   76 years old female with history of hypertension, type 2 diabetes, hyperlipidemia, osteoarthritis, bedbound, history of bilateral DVT who presented for evaluation of generalized weakness presented for weakness and inability to care for herself admitted with a diagnosis of UTI and NSTEMI cardiology on board, Noted trended down H/H.    See HPI for details.    Hospital Course (20 Days)   August 10, 2022: Due to skin breakdown on the labia as noted by wound care, external urinary catheter was not a possibility.  Patient consistently was urinating on herself.  Place Foley catheter to help with the wounds heal around the groin.  Spoken to nursing.  Seen and examined today with nurse at bedside as well.  Was asleep on the first encounter however was able to be woken up later on as stated that she was doing fine.  Still awaiting placement.    August 11, 2022: Patient doing well, easily awoken.  States that she feels fine.  Still waiting SNF placement versus the alternative.  Case management spoken to.  Patient encouraged to participate with physical therapy.  Also encouraged to drink fluids    August 12, 2022:  Patient  continues to do well, [facility] lift documentation submitted.  Appears that the patient's placement is taking a long time to be approved.  Will likely be going home with family member after DME has been delivered with home health until skilled nursing facility is agreeable to accept the patient.  Still awaiting discharge.    August 13, 2022: Patient reports no acute or adverse events overnight.  Nurse agrees with this.  Awaiting DME delivery and assembly before the patient goes home with niece as financial issues are ironed out for placement.  Foley catheter removed today and will reattempt pure wick catheterization at this time.  Foley catheter was necessary due to preventing worsening of groin and sacral wounds that were seen by wound care.  If PureWick continues to leak, will have to find an alternative means of bladder emptying.    Anemia  Hemoglobin has been dropping from  From 12-10--8.2  I have ordered H&H monitor every 6 hours  Fecal occult blood test  Given patient has history of DVT I will continue the anticoagulation for now  Fecal occult blood test  Will monitor closely  No obvious bleeding noted  11/29: So far H&H stable between 8-9, continue heparin drip, pending hematology evaluation) final recommendation, fecal occult blood test pending, patient had 1 bowel movement yesterday which is brown, no obvious bleeding noted  11/30: H&H stayed stable, patient switched back to Pradaxa per heme-onc recommendation, fecal occult blood test came  positive, GI consulted to weigh in, started on PPI  12/1-12/5: Discussed with GI, recommended outpatient EGD colonoscopy and Protonix 40 mg twice daily for a month followed by daily, H&H remained stable in fact uptrending plan communicated with the patient as well as niece  08/09/2022: H&H largely stable, will continue following until the patient is discharged    Sepsis likely from UTI  Presented with leukocytosis and tachycardia  CT scan of the chest showed small left  pleural effusion and evidence of pulmonary hypertension  UA consistent with UTI  Blood culture and urine culture came negative  Initially started on empirical broad-spectrum antibiotic vancomycin and Zosyn  MRSA test is negative  CT abdomen showed nonobstructive stone and small bladder stone  Antibiotic de-escalated to ceftriaxone  Leukocytosis improved  11/29-11/30: Patient will complete 5 days course of antibiotics today  08/09/2022: No symptoms of UTI, leukocytosis resolved for several days now        History of DVT  Patient had bilateral lower extremity DVT back in September 2023 currently on Pradaxa  Repeat venous ultrasound showed left femoral popliteal DVT extending to the left peroneal vein and posterior tibialis vein with associated mild soft tissue swelling/edema and short segment nonocclusive thrombus within the right common femoral vein  I will involve hematology to guide on treatment  11/29: CTA ruled out PE, for now we will continue heparin drip, will switch her back to [place] once hematology clears  11/30: Patient is a switch to Pradaxa after hematology cleared the patient  12/1-12/5: Patient tolerated Pradaxa, outpatient follow-up with hematology  08/09/2022: Tolerating Pradaxa    NSTEMI  Elevated troponin  Likely demand ischemia  Troponin trended up from 90-108  Patient is on aspirin, statin and Pradaxa  11/29-12/5: 2D echo showed normal EF, no LV wall motion abnormality, elevated troponin is likely from demand ischemia and anemia, continue aspirin/statin/Coreg/losartan.  Outpatient follow-up with cardiology  08/09/2022: No chest pain        Chronic stable medical condition  Hypertension: Continue amlodipine/metoprolol/losartan  11/29-12/5: Blood pressure within acceptable range, outpatient follow-up with PCP     Type 2 diabetes  Carb controlled diet  Sliding scale  Hypoglycemia protocol  12/1-12/5: Increase Lantus to 10, continue sliding scale, and carb controlled diet.  12/5: Blood sugar low 200s, I  have increased the Lantus to 12, continue to monitor POC    Morbid obesity  Outpatient follow-up     Bedbound  Wound on her left iliac area  Wound nurse consult       Procedures/Imaging:   Upon my review: Negative except discussed above         Progress Note/Physical Exam at Discharge     Subjective:Patient is stable for discharge.    Vitals:    08/13/22 0743 08/13/22 0851 08/13/22 1122 08/13/22 1123   BP:  116/70 (!) 85/52 95/61   Pulse: 95  90 90   Resp:   16    Temp:   98.6 F (37 C)    TempSrc:   Oral    SpO2:   98%    Weight:       Height:               GEN APPEARANCE: AAO x 2, on room air, in good spirits  HEENT: Nonicteric  NECK: Supple;  CVS: RRR, S1, S2;  LUNGS: Decreased breath sounds at bases  ABD: Soft; no tenderness to palpation, positive bowel sounds in all quadrants  EXT: No edema;  NEURO: No new focal deficits noted             Diagnostics     Labs/Studies Pending at Discharge: Yes outpatient EGD and colonoscopy    Last Labs   Recent Labs   Lab 08/10/22  0317 08/09/22  0450 08/07/22  0351   WBC  --  8.85 9.09   RBC  --  2.71* 2.49*   Hgb 8.2* 8.4* 8.0*   Hematocrit 26.8* 27.7* 25.3*   MCV  --  102.2* 101.6*   Platelets  --  336 356*         Recent Labs   Lab 08/13/22  0259 08/12/22  0440 08/11/22  0226 08/10/22  0317 08/09/22  0450   Sodium 139 136 137 135 137   Potassium 4.2 4.2 4.4 4.3 4.3   Chloride 102 100 102 100 101   CO2 30* 31* '29 28 29   '$ BUN 23.0* 22.0* 22.0* 26.0* 27.0*   Creatinine 0.8 0.8 0.8 0.8 0.8   Glucose 235* 168* 259* 297* 199*   Calcium 8.9 8.8 8.6 8.6 8.9   Magnesium 1.5* 1.7 1.7 0.9*  --          Microbiology Results (last 15 days)       ** No results found for the last 360 hours. **             Patient Instructions   Discharge Diet: Heart healthy diet  Discharge Activity: As tolerated    Follow Up Appointment:   Follow-up Information       West Carbo, MD Follow up in 1 week(s).    Specialties: Gastroenterology, Internal Medicine  Contact information:  [place] [address]  [phone]               Tia Alert, MD Follow up in 1 week(s).    Specialties: Medical Oncology, Hematology  Contact information:  [address]  [place] [address]  [phone]               Vonzell Schlatter, MD Follow up in 1 week(s).    Specialties: Interventional Cardiology, Internal Medicine, Cardiology  Contact information:  [place]  [place] [address]  [phone]               Pcp, None, MD .                              Discharge Medications:     Medication List        START taking these medications      aspirin 81 MG chewable tablet  Chew 1 tablet (81 mg) by mouth daily     atorvastatin 40 MG tablet  Commonly known as: LIPITOR  Take 1 tablet (40 mg) by mouth nightly     carboxymethylcellulose 0.5 % ophthalmic solution  Commonly known as: REFRESH TEARS  Place 1 drop into both eyes 3 (three) times daily as needed (dry eye)     insulin glargine 100 UNIT/ML injection  Commonly known as: LANTUS  Inject 12 Units into the skin nightly     pantoprazole 40 MG tablet  Commonly known as: PROTONIX  Take 1 tablet (40 mg) by mouth 2 (two) times daily for 30 days, THEN 1 tablet (40 mg) daily.  Start taking on: July 30, 2022  CONTINUE taking these medications      acetaminophen 500 MG tablet  Commonly known as: TYLENOL     CENTRUM SILVER 50+WOMEN PO     dabigatran 150 MG Caps  Commonly known as: PRADAXA     losartan 100 MG TABS, hydroCHLOROthiazide 25 MG TABS     metoprolol succinate 200 MG 24 hr tablet  Commonly known as: TOPROL-XL     TURMERIC PO     valACYclovir 1000 MG tablet  Commonly known as: VALTREX     vitamin D (ergocalciferol) 50000 UNIT Caps  Commonly known as: DRISDOL            STOP taking these medications      amLODIPine 5 MG tablet  Commonly known as: NORVASC     HUMULIN 70/30 KWIKPEN SC               Where to Get Your Medications        These medications were sent to [place], [place] - [address]  [address] [place] [address]      Phone: [phone]   aspirin 81 MG chewable tablet  atorvastatin 40 MG tablet  pantoprazole 40 MG tablet       Information about where to get these medications is not yet available    Ask your nurse or doctor about these medications  carboxymethylcellulose 0.5 % ophthalmic solution  insulin glargine 100 UNIT/ML injection          Time spent examining patient, discussing with patient/family regarding hospital course, chart review, reconciling medications and discharge planning: 40 minutes.    Signed,  Meredith Pel, MD    12:35 PM 08/13/2022

## 2022-08-13 NOTE — Progress Notes (Signed)
Update as follows:    CM facilitated a conversation with the pt about DCP discussed yesterday. The Pt shared she will still like to go home on 08/14/2022 once DME is set up. Covering Cm will continue to follow.    Casey Burkitt, MSW, Supervisee in [place] Work, Tourist information centre manager I

## 2022-08-13 NOTE — UM Notes (Signed)
CONCURRENT REVIEW August 13, 2022    PATIENT NAME: Meredith Baker, Meredith Baker  DOB: Apr 07, 1946    History of present illness: Pt is a 76 y.o. female arrived to hospital on 07/23/2022 at 1525.    BP 138/71   Pulse 90   Temp 98.6 F (37 C) (Oral)   Resp 18   Ht 1.575 m ('5\' 2"'$ )   Wt 104.8 kg (231 lb 0.7 oz)   SpO2 99%   BMI 42.26 kg/m     Temp:  [98.1 F (36.7 C)-99.5 F (37.5 C)]   Heart Rate:  [85-102]   Resp Rate:  [16-18]   BP: (85-144)/(52-71)   SpO2:  [96 %-100 %]   Weight:  [104.8 kg (231 lb 0.7 oz)]     Last recorded pain score:  Pain Scale Used: Numeric Scale (0-10)  Pain Score: 0-No pain       Abnormal Labs:   Lab Results last 48 Hours       Procedure Component Value Units Date/Time    Glucose Whole Blood - POCT [id number]  (Abnormal) Collected: 08/13/22 1508     Updated: 08/13/22 1517     Whole Blood Glucose POCT 261 mg/dL     Glucose Whole Blood - POCT [id number]  (Abnormal) Collected: 08/13/22 1113     Updated: 08/13/22 1142     Whole Blood Glucose POCT 241 mg/dL     Glucose Whole Blood - POCT [id number]  (Abnormal) Collected: 08/13/22 0701     Updated: 08/13/22 0713     Whole Blood Glucose POCT 157 mg/dL     Magnesium [id number]  (Abnormal) Collected: 08/13/22 0259    Specimen: Blood Updated: 08/13/22 0339     Magnesium 1.5 mg/dL     Basic Metabolic Panel 0000000  (Abnormal) Collected: 08/13/22 0259    Specimen: Blood Updated: 08/13/22 0339     Glucose 235 mg/dL      BUN 23.0 mg/dL      CO2 30 mEq/L     Glucose Whole Blood - POCT [id number]  (Abnormal) Collected: 08/12/22 2204     Updated: 08/12/22 2206     Whole Blood Glucose POCT 309 mg/dL     Glucose Whole Blood - POCT [id number]  (Abnormal) Collected: 08/12/22 1516     Updated: 08/12/22 1541     Whole Blood Glucose POCT 279 mg/dL     Glucose Whole Blood - POCT [id number]  (Abnormal) Collected: 08/12/22 1154     Updated: 08/12/22 1156     Whole Blood Glucose POCT 248 mg/dL     Glucose Whole Blood - POCT [id number]  (Abnormal) Collected:  08/12/22 0808     Updated: 08/12/22 0814     Whole Blood Glucose POCT 166 mg/dL     Basic Metabolic Panel 0000000  (Abnormal) Collected: 08/12/22 0440    Specimen: Blood Updated: 08/12/22 0534     Glucose 168 mg/dL      BUN 22.0 mg/dL      CO2 31 mEq/L     Glucose Whole Blood - POCT [id number]  (Abnormal) Collected: 08/11/22 2057     Updated: 08/11/22 2105     Whole Blood Glucose POCT 277 mg/dL      Diagnostics:  No results found.    MD Notes:  MD 08/13/2022  August 13, 2022: Patient reports no acute or adverse events overnight.  Nurse agrees with this.  Awaiting DME delivery and assembly before the patient goes home with niece as financial issues are ironed out for placement.  Foley  catheter removed today and will reattempt pure wick catheterization at this time.  Foley catheter was necessary due to preventing worsening of groin and sacral wounds that were seen by wound care.  If PureWick continues to leak, will have to find an alternative means of bladder emptying.     Anemia  Hemoglobin has been dropping from  From 12-10--8.2  I have ordered H&H monitor every 6 hours  Fecal occult blood test  Given patient has history of DVT I will continue the anticoagulation for now  Fecal occult blood test  Will monitor closely  No obvious bleeding noted  11/29: So far H&H stable between 8-9, continue heparin drip, pending hematology evaluation) final recommendation, fecal occult blood test pending, patient had 1 bowel movement yesterday which is brown, no obvious bleeding noted  11/30: H&H stayed stable, patient switched back to Pradaxa per heme-onc recommendation, fecal occult blood test came positive, GI consulted to weigh in, started on PPI  12/1-12/5: Discussed with GI, recommended outpatient EGD colonoscopy and Protonix 40 mg twice daily for a month followed by daily, H&H remained stable in fact uptrending plan communicated with the patient as well as niece  08/09/2022: H&H largely stable, will continue following  until the patient is discharged     Sepsis likely from UTI  Presented with leukocytosis and tachycardia  CT scan of the chest showed small left pleural effusion and evidence of pulmonary hypertension  UA consistent with UTI  Blood culture and urine culture came negative  Initially started on empirical broad-spectrum antibiotic vancomycin and Zosyn  MRSA test is negative  CT abdomen showed nonobstructive stone and small bladder stone  Antibiotic de-escalated to ceftriaxone  Leukocytosis improved  11/29-11/30: Patient will complete 5 days course of antibiotics today  08/09/2022: No symptoms of UTI, leukocytosis resolved for several days now    History of DVT  Patient had bilateral lower extremity DVT back in September 2023 currently on Pradaxa  Repeat venous ultrasound showed left femoral popliteal DVT extending to the left peroneal vein and posterior tibialis vein with associated mild soft tissue swelling/edema and short segment nonocclusive thrombus within the right common femoral vein  I will involve hematology to guide on treatment  11/29: CTA ruled out PE, for now we will continue heparin drip, will switch her back to [place] once hematology clears  11/30: Patient is a switch to Pradaxa after hematology cleared the patient  12/1-12/5: Patient tolerated Pradaxa, outpatient follow-up with hematology  08/09/2022: Tolerating Pradaxa     NSTEMI  Elevated troponin  Likely demand ischemia  Troponin trended up from 90-108  Patient is on aspirin, statin and Pradaxa  11/29-12/5: 2D echo showed normal EF, no LV wall motion abnormality, elevated troponin is likely from demand ischemia and anemia, continue aspirin/statin/Coreg/losartan.  Outpatient follow-up with cardiology  08/09/2022: No chest pain  Chronic stable medical condition  Hypertension: Continue amlodipine/metoprolol/losartan  11/29-12/5: Blood pressure within acceptable range, outpatient follow-up with PCP  Type 2 diabetes  Carb controlled diet  Sliding  scale  Hypoglycemia protocol  12/1-12/5: Increase Lantus to 10, continue sliding scale, and carb controlled diet.  12/5: Blood sugar low 200s, I have increased the Lantus to 12, continue to monitor POC  Morbid obesity  Outpatient follow-up  Bedbound  Wound on her left iliac area  Wound nurse consult  Procedures/Imaging:   Upon my review: Negative except discussed above    Medications: Scheduled Meds:  Current Facility-Administered Medications   Medication Dose Route Frequency  aspirin  81 mg Oral Daily    atorvastatin  40 mg Oral QHS    balsam peru-castor oil (VENELEX)   Topical Q12H    carvedilol  12.5 mg Oral Q12H SCH    dabigatran  150 mg Oral Q12H SCH    gabapentin  200 mg Oral Q8H [place]    insulin glargine  12 Units Subcutaneous QHS    insulin lispro  1-4 Units Subcutaneous QHS    insulin lispro  1-8 Units Subcutaneous TID AC    losartan (COZAAR) 100 mg, hydroCHLOROthiazide (HYDRODIURIL) 25 mg for HYZAAR   Oral Daily    miconazole 2 % with zinc oxide   Topical Q12H    pantoprazole  40 mg Oral BID    polyethylene glycol  17 g Oral Daily    senna-docusate  1 tablet Oral QHS     Continuous Infusions:  PRN Meds:.acetaminophen, benzocaine-menthol, benzonatate, artificial tears (REFRESH PLUS), dextrose **OR** dextrose **OR** dextrose **OR** glucagon (rDNA), magnesium sulfate, melatonin, naloxone, ondansetron, potassium & sodium phosphates, potassium chloride **AND** potassium chloride, saline, traMADol      This clinical review is based on compiled documentation provided by the treatment team within the patient's medical record.      UTILIZATION REVIEW CONTACT :   Regis Bill, MSN, RN, ACM  Utilization Review Case Manager ll   Phone: [phone] (vm only)  [place]: [phone]  Fax Line [phone]  Cell [phone] (8a-5p)  Macy [email or web address].[email or web address] [email or web address] .Radonna Ricker    Tax ID: [id number]  [facility]   ([place]) [facility]   ([place]) [facility]   [facility]) [facility]   [facility]) [facility]  [facility])   [place].  [place], [place] [address] [address]  [place], [place] [address] [address]  [place], [place] [address] [phone] [facility].  [place], [place] [address] [address]  [place], [place] [address]   NPI: [id number] NPI: [id number] NPI: [id number] NPI: [id number] [id number]

## 2022-08-13 NOTE — Nursing Progress Note (Signed)
Patient becoming confused at the end of the shift.  Patient alert and oriented x4 during the day, during the evening becoming confused and forgetful (thinks she is at home).  Patient's foley catheter removed this shift and external catheter in place.  Patient voiding, unable to tell staff when she has to urinate.  Patient vital signs stable and treated for blood glucose levels outside of parameters.  Patient still awaiting placement or home with home health at this time.  Will continue to monitor and treat per physician orders.

## 2022-08-13 NOTE — Plan of Care (Signed)
Problem: Moderate/High Fall Risk Score >5  Goal: Patient will remain free of falls  Outcome: Progressing  Flowsheets  Taken 08/12/2022 2200 by Prescott Gum, RN  High (Greater than 13):   HIGH-Visual cue at entrance to patient's room   HIGH-Bed alarm on at all times while patient in bed   HIGH-Apply yellow "Fall Risk" arm band  Taken 08/12/2022 1700 by Vickii Penna, RN  Moderate Risk (6-13):   MOD-Perform dangle, stand, walk (DSW) prior to mobilization   MOD-Request PT/OT consult order for patients with gait/mobility impairment   MOD-Use gait belt when appropriate   MOD-include family in multidisciplinary POC discussions   MOD- Consider video monitoring   LOW-Anticoagulation education for injury risk   MOD-Consider activation of bed alarm if appropriate   MOD-Apply bed exit alarm if patient is confused   MOD-Floor mat at bedside (where available) if appropriate   MOD-Consider a move closer to [place]   MOD-Remain with patient during toileting   MOD-Place bedside commode and assistive devices out of sight when not in use   MOD-Re-orient confused patients   MOD-Utilize diversion activities  VH Moderate Risk (6-13):   ALL REQUIRED LOW INTERVENTIONS   INITIATE YELLOW "FALL RISK" SIGNAGE   YELLOW NON-SKID SLIPPERS   YELLOW "FALL RISK" ARM BAND   USE OF BED EXIT ALARM IF PATIENT IS CONFUSED OR IMPULSIVE. PLACE RESET BED ALARM SIGN ABOVE BED   PLACE FALL RISK LEVEL ON WHITE BOARD FOR COMMUNICATION PURPOSES IN PATIENT'S ROOM   Include family/significant other in multidisciplinary discussion regarding plan of care as appropriate   Remain with patient during toileting   Request PT/OT therapy consult order from Physician for patients with gait/mobility impairment   Use assistive devices   Use chair-pad alarm device   Use of floor mat  Taken 08/07/2022 1606 by Rummel, Sunny Schlein, RN  VH High Risk (Greater than 13):   ALL REQUIRED LOW INTERVENTIONS   ALL REQUIRED MODERATE INTERVENTIONS   A CHAIR PAD ALARM  WILL BE USED WHEN PATIENT IS UP SITTING IN A CHAIR   Keep door open for better visibility   Use assistive devices   Use chair-pad alarm device   Use of floor mat     Problem: Compromised Tissue integrity  Goal: Damaged tissue is healing and protected  Outcome: Progressing  Flowsheets (Taken 08/13/2022 0845)  Damaged tissue is healing and protected:   Monitor/assess Braden scale every shift   Provide wound care per wound care algorithm   Reposition patient every 2 hours and as needed unless able to reposition self   Increase activity as tolerated/progressive mobility   Relieve pressure to bony prominences for patients at moderate and high risk   Avoid shearing injuries   Keep intact skin clean and dry   Use bath wipes, not soap and water, for daily bathing   Use incontinence wipes for cleaning urine, stool and caustic drainage. Foley care as needed  Goal: Nutritional status is improving  Outcome: Progressing  Flowsheets (Taken 08/13/2022 0845)  Nutritional status is improving:   Allow adequate time for meals   Encourage patient to take dietary supplement(s) as ordered   Collaborate with Clinical Nutritionist   Include patient/patient care companion in decisions related to nutrition   Assist patient with eating     Problem: Compromised Moisture  Goal: Moisture level Interventions  Outcome: Progressing  Flowsheets (Taken 08/13/2022 0845)  Moisture level Interventions: Moisture wicking products, Moisture barrier cream     Problem: Compromised  Activity/Mobility  Goal: Activity/Mobility Interventions  Outcome: Progressing  Flowsheets (Taken 08/13/2022 0845)  Activity/Mobility Interventions: Pad bony prominences, TAP Seated positioning system when OOB, Promote PMP, Reposition q 2 hrs / turn clock, Offload heels (boots or pillows)     Problem: Compromised Friction/Shear  Goal: Friction and Shear Interventions  Outcome: Progressing  Flowsheets (Taken 08/13/2022 0845)  Friction and Shear Interventions: HOB 30 degrees or less,  Pad bony prominences/Heel Foam, TAP Seated positioning system when OOB, Off load heels

## 2022-08-14 LAB — GLUCOSE WHOLE BLOOD - POCT
Whole Blood Glucose POCT: 147 mg/dL — ABNORMAL HIGH (ref 70–100)
Whole Blood Glucose POCT: 233 mg/dL — ABNORMAL HIGH (ref 70–100)
Whole Blood Glucose POCT: 281 mg/dL — ABNORMAL HIGH (ref 70–100)
Whole Blood Glucose POCT: 332 mg/dL — ABNORMAL HIGH (ref 70–100)

## 2022-08-14 LAB — BASIC METABOLIC PANEL
Anion Gap: 7 (ref 5.0–15.0)
BUN: 25 mg/dL — ABNORMAL HIGH (ref 7.0–21.0)
CO2: 29 mEq/L (ref 17–29)
Calcium: 8.8 mg/dL (ref 7.9–10.2)
Chloride: 103 mEq/L (ref 99–111)
Creatinine: 0.8 mg/dL (ref 0.4–1.0)
Glucose: 171 mg/dL — ABNORMAL HIGH (ref 70–100)
Potassium: 4.4 mEq/L (ref 3.5–5.3)
Sodium: 139 mEq/L (ref 135–145)
eGFR: >60 mL/min/{1.73_m2} (ref >=60)

## 2022-08-14 LAB — MAGNESIUM: Magnesium: 1.4 mg/dL — ABNORMAL LOW (ref 1.6–2.6)

## 2022-08-14 MED ORDER — POTASSIUM CHLORIDE 10 MEQ/100ML IV SOLN
10.0000 meq | INTRAVENOUS | Status: DC | PRN
Start: 2022-08-14 — End: 2022-08-15

## 2022-08-14 MED ORDER — MAGNESIUM SULFATE IN D5W 1-5 GM/100ML-% IV SOLN
1.0000 g | INTRAVENOUS | Status: DC | PRN
Start: 2022-08-14 — End: 2022-08-15
  Filled 2022-08-14: qty 100

## 2022-08-14 MED ORDER — POTASSIUM & SODIUM PHOSPHATES 280-160-250 MG PO PACK
2.0000 | PACK | ORAL | Status: DC | PRN
Start: 2022-08-14 — End: 2022-08-15

## 2022-08-14 MED ORDER — POTASSIUM CHLORIDE CRYS ER 20 MEQ PO TBCR
0.0000 meq | EXTENDED_RELEASE_TABLET | ORAL | Status: DC | PRN
Start: 2022-08-14 — End: 2022-08-15

## 2022-08-14 NOTE — Progress Notes (Signed)
HOME HEALTH LIAISON:   D/C PLANNING:   DME:  [facility] Lift     ANTICIPATED READINESS FOR DISCHARGE DATE:  Discharge Order is placed    ANTICIPATED DISCHARGE PLAN:  Discharge with DME: [facility])    DETAILS:  Order for DME:  [facility] Lift was placed on FRI 12/15 afternoon by Emy - if patient to Depart to a private home setting, patient could wait at home for Auth and Delivery arrangement confirmation in the Week ahead - if patient to Depart to a [place] may require that [place] and Delivery arrangements have been made and confirmed prior to agreeing to accept the patient    NEXT STEPS:  on MON 12/18, Auth for [employer] is required, and then upon Auth, delivery plan will need to be arranged and confirmed - patient can plan to Depart from Hospital once the Auth is completed and delivery arrangement have been made and confirmed on that day     FOLLOW-UP:  DME Team and Emy have been alerted via [place] to folllow-up on MON 12/18 for Auth and delivery arrangements for [employer], Weekend Only [facility] [phone]

## 2022-08-14 NOTE — Plan of Care (Signed)
Problem: Hemodynamic Status: Cardiac  Goal: Stable vital signs and fluid balance  Outcome: Progressing     Problem: Inadequate Tissue Perfusion  Goal: Adequate tissue perfusion will be maintained  Outcome: Progressing     Problem: Ineffective Gas Exchange  Goal: Effective breathing pattern  Outcome: Progressing     Problem: Bladder/Voiding  Goal: Patient will experience proper bladder emptying during admission and remain free from infection  Outcome: Progressing

## 2022-08-14 NOTE — Plan of Care (Signed)
Problem: Moderate/High Fall Risk Score >5  Goal: Patient will remain free of falls  Flowsheets (Taken 08/12/2022 1700 by Vickii Penna, RN)  Moderate Risk (6-13):   MOD-Perform dangle, stand, walk (DSW) prior to mobilization   MOD-Request PT/OT consult order for patients with gait/mobility impairment   MOD-Use gait belt when appropriate   MOD-include family in multidisciplinary POC discussions   MOD- Consider video monitoring   LOW-Anticoagulation education for injury risk   MOD-Consider activation of bed alarm if appropriate   MOD-Apply bed exit alarm if patient is confused   MOD-Floor mat at bedside (where available) if appropriate   MOD-Consider a move closer to [place]   MOD-Remain with patient during toileting   MOD-Place bedside commode and assistive devices out of sight when not in use   MOD-Re-orient confused patients   MOD-Utilize diversion activities

## 2022-08-14 NOTE — Discharge Summary (Signed)
[facility]      Patient: Meredith Baker  Admission Date: 07/23/2022   DOB: Jun 29, 1946  Discharge Date: 08/14/2022    MRN: [id number]  Discharge Attending:Dowell Hoon Candiss Norse, MD     Referring Physician: Pcp, None, MD  PCP: Pcp, None, MD       DISCHARGE SUMMARY     Discharge Information   Admission Diagnosis:   NSTEMI (non-ST elevated myocardial infarction)    Discharge Diagnosis:   Megaloblastic anemia  Sepsis  UTI?  History of DVT  Elevated troponin  Hypertension  Type 2 diabetes with hyperglycemia  Morbid obesity  Hypercoagulable state  Left iliac wound    Admission Condition: Guarded  Discharge Condition: Stable and improved  Consultants: Cardiology/pathology and gastroenterology  Functional Status: Bedbound  Discharged to: SNF, awaiting placement     Hospital Course   Presentation History   76 years old female with history of hypertension, type 2 diabetes, hyperlipidemia, osteoarthritis, bedbound, history of bilateral DVT who presented for evaluation of generalized weakness presented for weakness and inability to care for herself admitted with a diagnosis of UTI and NSTEMI cardiology on board, Noted trended down H/H.    See HPI for details.    Hospital Course (21 Days)   August 10, 2022: Due to skin breakdown on the labia as noted by wound care, external urinary catheter was not a possibility.  Patient consistently was urinating on herself.  Place Foley catheter to help with the wounds heal around the groin.  Spoken to nursing.  Seen and examined today with nurse at bedside as well.  Was asleep on the first encounter however was able to be woken up later on as stated that she was doing fine.  Still awaiting placement.    August 11, 2022: Patient doing well, easily awoken.  States that she feels fine.  Still waiting SNF placement versus the alternative.  Case management spoken to.  Patient encouraged to participate with physical therapy.  Also encouraged to drink fluids    August 12, 2022:  Patient  continues to do well, [facility] lift documentation submitted.  Appears that the patient's placement is taking a long time to be approved.  Will likely be going home with family member after DME has been delivered with home health until skilled nursing facility is agreeable to accept the patient.  Still awaiting discharge.    August 13, 2022: Patient reports no acute or adverse events overnight.  Nurse agrees with this.  Awaiting DME delivery and assembly before the patient goes home with niece as financial issues are ironed out for placement.  Foley catheter removed today and will reattempt pure wick catheterization at this time.  Foley catheter was necessary due to preventing worsening of groin and sacral wounds that were seen by wound care.  If PureWick continues to leak, will have to find an alternative means of bladder emptying.    August 14, 2022: Patient is seen and examined at bedside.  Nurses in the room during this encounter as well.  Reports of 1 episode of leaking from the pure wick catheter that was placed yesterday after removal of Foley catheter.  Will keep an eye on this.  Where lift to be installed tomorrow, possible discharge in the afternoon or evening.    Anemia  Hemoglobin has been dropping from  From 12-10--8.2  I have ordered H&H monitor every 6 hours  Fecal occult blood test  Given patient has history of DVT I will continue the anticoagulation for now  Fecal occult blood test  Will monitor closely  No obvious bleeding noted  11/29: So far H&H stable between 8-9, continue heparin drip, pending hematology evaluation) final recommendation, fecal occult blood test pending, patient had 1 bowel movement yesterday which is brown, no obvious bleeding noted  11/30: H&H stayed stable, patient switched back to Pradaxa per heme-onc recommendation, fecal occult blood test came positive, GI consulted to weigh in, started on PPI  12/1-12/5: Discussed with GI, recommended outpatient EGD colonoscopy and  Protonix 40 mg twice daily for a month followed by daily, H&H remained stable in fact uptrending plan communicated with the patient as well as niece  08/09/2022: H&H largely stable, will continue following until the patient is discharged    Sepsis likely from UTI  Presented with leukocytosis and tachycardia  CT scan of the chest showed small left pleural effusion and evidence of pulmonary hypertension  UA consistent with UTI  Blood culture and urine culture came negative  Initially started on empirical broad-spectrum antibiotic vancomycin and Zosyn  MRSA test is negative  CT abdomen showed nonobstructive stone and small bladder stone  Antibiotic de-escalated to ceftriaxone  Leukocytosis improved  11/29-11/30: Patient will complete 5 days course of antibiotics today  08/09/2022: No symptoms of UTI, leukocytosis resolved for several days now        History of DVT  Patient had bilateral lower extremity DVT back in September 2023 currently on Pradaxa  Repeat venous ultrasound showed left femoral popliteal DVT extending to the left peroneal vein and posterior tibialis vein with associated mild soft tissue swelling/edema and short segment nonocclusive thrombus within the right common femoral vein  I will involve hematology to guide on treatment  11/29: CTA ruled out PE, for now we will continue heparin drip, will switch her back to [place] once hematology clears  11/30: Patient is a switch to Pradaxa after hematology cleared the patient  12/1-12/5: Patient tolerated Pradaxa, outpatient follow-up with hematology  08/09/2022: Tolerating Pradaxa    NSTEMI  Elevated troponin  Likely demand ischemia  Troponin trended up from 90-108  Patient is on aspirin, statin and Pradaxa  11/29-12/5: 2D echo showed normal EF, no LV wall motion abnormality, elevated troponin is likely from demand ischemia and anemia, continue aspirin/statin/Coreg/losartan.  Outpatient follow-up with cardiology  08/09/2022: No chest pain        Chronic stable  medical condition  Hypertension: Continue amlodipine/metoprolol/losartan  11/29-12/5: Blood pressure within acceptable range, outpatient follow-up with PCP     Type 2 diabetes  Carb controlled diet  Sliding scale  Hypoglycemia protocol  12/1-12/5: Increase Lantus to 10, continue sliding scale, and carb controlled diet.  12/5: Blood sugar low 200s, I have increased the Lantus to 12, continue to monitor POC    Morbid obesity  Outpatient follow-up     Bedbound  Wound on her left iliac area  Wound nurse consult       Procedures/Imaging:   Upon my review: Negative except discussed above         Progress Note/Physical Exam at Discharge     Subjective:Patient is stable for discharge.    Vitals:    08/13/22 2307 08/14/22 0430 08/14/22 0743 08/14/22 1128   BP: 122/65 117/71 129/69 97/60   Pulse: 91 86 83 70   Resp: '18 18 17 17   '$ Temp: 98.6 F (37 C) 97.9 F (36.6 C) 98.2 F (36.8 C) 98.2 F (36.8 C)   TempSrc: Oral Oral Oral Oral   SpO2: 99% 98%  94% 95%   Weight:   105.5 kg (232 lb 8 oz)    Height:               GEN APPEARANCE:  A&OX2, on room air  HEENT: Nonicteric  NECK: Supple;   CVS: RRR, S1, S2, no JVD noted  LUNGS: Decreased breath sounds at bases  ABD: Soft; no tenderness to palpation, bowel sounds present in  EXT: No edema;   NEURO: No new focal deficits noted,             Diagnostics     Labs/Studies Pending at Discharge: Yes outpatient EGD and colonoscopy    Last Labs   Recent Labs   Lab 08/10/22  0317 08/09/22  0450   WBC  --  8.85   RBC  --  2.71*   Hgb 8.2* 8.4*   Hematocrit 26.8* 27.7*   MCV  --  102.2*   Platelets  --  336         Recent Labs   Lab 08/14/22  0434 08/13/22  0259 08/12/22  0440 08/11/22  0226 08/10/22  0317   Sodium 139 139 136 137 135   Potassium 4.4 4.2 4.2 4.4 4.3   Chloride 103 102 100 102 100   CO2 29 30* 31* 29 28   BUN 25.0* 23.0* 22.0* 22.0* 26.0*   Creatinine 0.8 0.8 0.8 0.8 0.8   Glucose 171* 235* 168* 259* 297*   Calcium 8.8 8.9 8.8 8.6 8.6   Magnesium 1.4* 1.5* 1.7 1.7 0.9*          Microbiology Results (last 15 days)       ** No results found for the last 360 hours. **             Patient Instructions   Discharge Diet: Heart healthy diet  Discharge Activity: As tolerated    Follow Up Appointment:   Follow-up Information       West Carbo, MD Follow up in 1 week(s).    Specialties: Gastroenterology, Internal Medicine  Contact information:  [place] [address]  [phone]               Tia Alert, MD Follow up in 1 week(s).    Specialties: Medical Oncology, Hematology  Contact information:  [address]  [place] [address]  [phone]               Vonzell Schlatter, MD Follow up in 1 week(s).    Specialties: Interventional Cardiology, Internal Medicine, Cardiology  Contact information:  [place]  [place] [address]  [phone]               Pcp, None, MD .                              Discharge Medications:     Medication List        START taking these medications      aspirin 81 MG chewable tablet  Chew 1 tablet (81 mg) by mouth daily     atorvastatin 40 MG tablet  Commonly known as: LIPITOR  Take 1 tablet (40 mg) by mouth nightly     carboxymethylcellulose 0.5 % ophthalmic solution  Commonly known as: REFRESH TEARS  Place 1 drop into both eyes 3 (three) times daily as needed (dry eye)  insulin glargine 100 UNIT/ML injection  Commonly known as: LANTUS  Inject 12 Units into the skin nightly     pantoprazole 40 MG tablet  Commonly known as: PROTONIX  Take 1 tablet (40 mg) by mouth 2 (two) times daily for 30 days, THEN 1 tablet (40 mg) daily.  Start taking on: July 30, 2022            CONTINUE taking these medications      acetaminophen 500 MG tablet  Commonly known as: TYLENOL     CENTRUM SILVER 50+WOMEN PO     dabigatran 150 MG Caps  Commonly known as: PRADAXA     losartan 100 MG TABS, hydroCHLOROthiazide 25 MG TABS     metoprolol succinate 200 MG 24 hr tablet  Commonly known as: TOPROL-XL     TURMERIC PO      valACYclovir 1000 MG tablet  Commonly known as: VALTREX     vitamin D (ergocalciferol) 50000 UNIT Caps  Commonly known as: DRISDOL            STOP taking these medications      amLODIPine 5 MG tablet  Commonly known as: NORVASC     HUMULIN 70/30 KWIKPEN SC               Where to Get Your Medications        These medications were sent to [facility] MD Cannon, [place] - [address]  [address] [place] [address]      Phone: [phone]   aspirin 81 MG chewable tablet  atorvastatin 40 MG tablet  pantoprazole 40 MG tablet       Information about where to get these medications is not yet available    Ask your nurse or doctor about these medications  carboxymethylcellulose 0.5 % ophthalmic solution  insulin glargine 100 UNIT/ML injection          Time spent examining patient, discussing with patient/family regarding hospital course, chart review, reconciling medications and discharge planning: 40 minutes.    Signed,  Meredith Pel, MD    12:54 PM 08/14/2022

## 2022-08-14 NOTE — UM Notes (Signed)
CONCURRENT REVIEW August 14, 2022    PATIENT NAME: Meredith Baker, Meredith Baker  DOB: 29-Mar-1946    History of present illness: Pt is a 76 y.o. female arrived to hospital on 07/23/2022 at 1525.    BP 129/69   Pulse 83   Temp 98.2 F (36.8 C) (Oral)   Resp 17   Ht 1.575 m ('5\' 2"'$ )   Wt 105.5 kg (232 lb 8 oz)   SpO2 94%   BMI 42.52 kg/m     Temp:  [97.9 F (36.6 C)-98.6 F (37 C)]   Heart Rate:  [83-105]   Resp Rate:  [16-18]   BP: (85-138)/(52-71)   SpO2:  [94 %-99 %]   Weight:  [105.5 kg (232 lb 8 oz)]     Last recorded pain score:  Pain Scale Used: Numeric Scale (0-10)  Pain Score: 0-No pain       Abnormal Labs:   Lab Results last 48 Hours       Procedure Component Value Units Date/Time    Glucose Whole Blood - POCT [id number]  (Abnormal) Collected: 08/14/22 0741     Updated: 08/14/22 0809     Whole Blood Glucose POCT 147 mg/dL     Magnesium [id number]  (Abnormal) Collected: 08/14/22 0434    Specimen: Blood Updated: 08/14/22 0503     Magnesium 1.4 mg/dL     Basic Metabolic Panel [id number]  (Abnormal) Collected: 08/14/22 0434    Specimen: Blood Updated: 08/14/22 0503     Glucose 171 mg/dL      BUN 25.0 mg/dL     Glucose Whole Blood - POCT [id number]  (Abnormal) Collected: 08/13/22 2057     Updated: 08/13/22 2121     Whole Blood Glucose POCT 222 mg/dL     Glucose Whole Blood - POCT [id number]  (Abnormal) Collected: 08/13/22 1508     Updated: 08/13/22 1517     Whole Blood Glucose POCT 261 mg/dL     Glucose Whole Blood - POCT [id number]  (Abnormal) Collected: 08/13/22 1113     Updated: 08/13/22 1142     Whole Blood Glucose POCT 241 mg/dL     Glucose Whole Blood - POCT [id number]  (Abnormal) Collected: 08/13/22 0701     Updated: 08/13/22 0713     Whole Blood Glucose POCT 157 mg/dL     Magnesium [id number]  (Abnormal) Collected: 08/13/22 0259    Specimen: Blood Updated: 08/13/22 0339     Magnesium 1.5 mg/dL     Basic Metabolic Panel [id number]  (Abnormal) Collected: 08/13/22 0259    Specimen: Blood Updated:  08/13/22 0339     Glucose 235 mg/dL      BUN 23.0 mg/dL      CO2 30 mEq/L     Glucose Whole Blood - POCT [id number]  (Abnormal) Collected: 08/12/22 2204     Updated: 08/12/22 2206     Whole Blood Glucose POCT 309 mg/dL     Glucose Whole Blood - POCT [id number]  (Abnormal) Collected: 08/12/22 1516     Updated: 08/12/22 1541     Whole Blood Glucose POCT 279 mg/dL     Glucose Whole Blood - POCT [id number]  (Abnormal) Collected: 08/12/22 1154     Updated: 08/12/22 1156     Whole Blood Glucose POCT 248 mg/dL        Diagnostics:  No results found.    MD Notes:  August 14, 2022: Patient is seen and examined at bedside.  Nurses in the room during this encounter as well.  Reports  of 1 episode of leaking from the pure wick catheter that was placed yesterday after removal of Foley catheter.  Will keep an eye on this.  Where lift to be installed tomorrow, possible discharge in the afternoon or evening.     Anemia  Hemoglobin has been dropping from  From 12-10--8.2  I have ordered H&H monitor every 6 hours  Fecal occult blood test  Given patient has history of DVT I will continue the anticoagulation for now  Fecal occult blood test  Will monitor closely  No obvious bleeding noted  11/29: So far H&H stable between 8-9, continue heparin drip, pending hematology evaluation) final recommendation, fecal occult blood test pending, patient had 1 bowel movement yesterday which is brown, no obvious bleeding noted  11/30: H&H stayed stable, patient switched back to Pradaxa per heme-onc recommendation, fecal occult blood test came positive, GI consulted to weigh in, started on PPI  12/1-12/5: Discussed with GI, recommended outpatient EGD colonoscopy and Protonix 40 mg twice daily for a month followed by daily, H&H remained stable in fact uptrending plan communicated with the patient as well as niece  08/09/2022: H&H largely stable, will continue following until the patient is discharged  Sepsis likely from UTI  Presented with  leukocytosis and tachycardia  CT scan of the chest showed small left pleural effusion and evidence of pulmonary hypertension  UA consistent with UTI  Blood culture and urine culture came negative  Initially started on empirical broad-spectrum antibiotic vancomycin and Zosyn  MRSA test is negative  CT abdomen showed nonobstructive stone and small bladder stone  Antibiotic de-escalated to ceftriaxone  Leukocytosis improved  11/29-11/30: Patient will complete 5 days course of antibiotics today  08/09/2022: No symptoms of UTI, leukocytosis resolved for several days now  History of DVT  Patient had bilateral lower extremity DVT back in September 2023 currently on Pradaxa  Repeat venous ultrasound showed left femoral popliteal DVT extending to the left peroneal vein and posterior tibialis vein with associated mild soft tissue swelling/edema and short segment nonocclusive thrombus within the right common femoral vein  I will involve hematology to guide on treatment  11/29: CTA ruled out PE, for now we will continue heparin drip, will switch her back to [place] once hematology clears  11/30: Patient is a switch to Pradaxa after hematology cleared the patient  12/1-12/5: Patient tolerated Pradaxa, outpatient follow-up with hematology  08/09/2022: Tolerating Pradaxa  NSTEMI  Elevated troponin  Likely demand ischemia  Troponin trended up from 90-108  Patient is on aspirin, statin and Pradaxa  11/29-12/5: 2D echo showed normal EF, no LV wall motion abnormality, elevated troponin is likely from demand ischemia and anemia, continue aspirin/statin/Coreg/losartan.  Outpatient follow-up with cardiology  08/09/2022: No chest pain  Chronic stable medical condition  Hypertension: Continue amlodipine/metoprolol/losartan  11/29-12/5: Blood pressure within acceptable range, outpatient follow-up with PCP  Type 2 diabetes  Carb controlled diet  Sliding scale  Hypoglycemia protocol  12/1-12/5: Increase Lantus to 10, continue sliding scale, and  carb controlled diet.  12/5: Blood sugar low 200s, I have increased the Lantus to 12, continue to monitor POC  Morbid obesity  Outpatient follow-up  Bedbound  Wound on her left iliac area  Wound nurse consult  Procedures/Imaging:   Upon my review: Negative except discussed above       Medications: Scheduled Meds:  Current Facility-Administered Medications   Medication Dose Route Frequency    aspirin  81 mg Oral Daily    atorvastatin  40 mg  Oral QHS    balsam peru-castor oil (VENELEX)   Topical Q12H    carvedilol  12.5 mg Oral Q12H SCH    dabigatran  150 mg Oral Q12H SCH    gabapentin  200 mg Oral Q8H SCH    insulin glargine  12 Units Subcutaneous QHS    insulin lispro  1-4 Units Subcutaneous QHS    insulin lispro  1-8 Units Subcutaneous TID AC    losartan (COZAAR) 100 mg, hydroCHLOROthiazide (HYDRODIURIL) 25 mg for HYZAAR   Oral Daily    miconazole 2 % with zinc oxide   Topical Q12H    pantoprazole  40 mg Oral BID    polyethylene glycol  17 g Oral Daily    senna-docusate  1 tablet Oral QHS     Continuous Infusions:  PRN Meds:.acetaminophen, benzocaine-menthol, benzonatate, artificial tears (REFRESH PLUS), dextrose **OR** dextrose **OR** dextrose **OR** glucagon (rDNA), magnesium sulfate, melatonin, naloxone, ondansetron, potassium & sodium phosphates, potassium chloride **AND** potassium chloride, saline, traMADol      This clinical review is based on compiled documentation provided by the treatment team within the patient's medical record.      UTILIZATION REVIEW CONTACT :   Regis Bill, MSN, RN, ACM  Utilization Review Case Manager ll   Phone: [phone] (vm only)  Main Line: [phone]  Fax Line [phone]  Cell [phone] (8a-5p)  [email or web address]    Tax ID: [id number]  [facility]   ([place]) [facility]   ([place]) [facility]   [facility]) [facility]   [facility]) [facility]  [facility])   [place].  [place] [address] [place], [place] [address] [address]  [place] [address] [phone] [facility].  [place], [place] [address] [place]  [place], [place] [address]   NPI: [id number] NPI: [id number] NPI: [id number] NPI: [id number] [id number]

## 2022-08-14 NOTE — Progress Notes (Signed)
LOS # [id number]      Summary of Discharge Plan:  [place] to home (pt request) w/DME (hoyer lift)  [facility] through [facility] during pt's apt office hours for assistance with keys -[employer] Abigail Butts) [phone] (Hours of operation Mon-wed 9-5, Thursday closed, Friday 9-1)     Identified Possible Discharge Barriers:  DME confirmation to home    CM Interventions and Outcome:  CM messaged Terrial Rhodes, [facility] for update on DME. States DME needs Josem Kaufmann and will be pursued on Monday.   CM messaaged Care Team to update on [place] Process.  CM met with patient at bedside and updated on [place] progress.    Discussed above Discharge Plan with (patient, family, Care Team, others):  Pt, Care Team    Case Management will continue to following on patient's discharge needs.

## 2022-08-15 LAB — GLUCOSE WHOLE BLOOD - POCT
Whole Blood Glucose POCT: 175 mg/dL — ABNORMAL HIGH (ref 70–100)
Whole Blood Glucose POCT: 157 mg/dL — ABNORMAL HIGH (ref 70–100)

## 2022-08-15 NOTE — Discharge Summary (Signed)
[facility]      Patient: Meredith Baker  Admission Date: 07/23/2022   DOB: Jun 22, 1946  Discharge Date: 08/15/2022    MRN: [id number]  Discharge Attending:Charonda Hefter Candiss Norse, MD     Referring Physician: Pcp, None, MD  PCP: Pcp, None, MD       DISCHARGE SUMMARY     Discharge Information   Admission Diagnosis:   NSTEMI (non-ST elevated myocardial infarction)    Discharge Diagnosis:   Megaloblastic anemia  Sepsis  UTI?  History of DVT  Elevated troponin  Hypertension  Type 2 diabetes with hyperglycemia  Morbid obesity  Hypercoagulable state  Left iliac wound    Admission Condition: Guarded  Discharge Condition: Stable and improved  Consultants: Cardiology/pathology and gastroenterology  Functional Status: Bedbound  Discharged to: SNF, awaiting placement     Hospital Course   Presentation History   76 years old female with history of hypertension, type 2 diabetes, hyperlipidemia, osteoarthritis, bedbound, history of bilateral DVT who presented for evaluation of generalized weakness presented for weakness and inability to care for herself admitted with a diagnosis of UTI and NSTEMI cardiology on board, Noted trended down H/H.    See HPI for details.    Hospital Course (22 Days)   August 10, 2022: Due to skin breakdown on the labia as noted by wound care, external urinary catheter was not a possibility.  Patient consistently was urinating on herself.  Place Foley catheter to help with the wounds heal around the groin.  Spoken to nursing.  Seen and examined today with nurse at bedside as well.  Was asleep on the first encounter however was able to be woken up later on as stated that she was doing fine.  Still awaiting placement.    August 11, 2022: Patient doing well, easily awoken.  States that she feels fine.  Still waiting SNF placement versus the alternative.  Case management spoken to.  Patient encouraged to participate with physical therapy.  Also encouraged to drink fluids    August 12, 2022:  Patient  continues to do well, [facility] lift documentation submitted.  Appears that the patient's placement is taking a long time to be approved.  Will likely be going home with family member after DME has been delivered with home health until skilled nursing facility is agreeable to accept the patient.  Still awaiting discharge.    August 13, 2022: Patient reports no acute or adverse events overnight.  Nurse agrees with this.  Awaiting DME delivery and assembly before the patient goes home with niece as financial issues are ironed out for placement.  Foley catheter removed today and will reattempt pure wick catheterization at this time.  Foley catheter was necessary due to preventing worsening of groin and sacral wounds that were seen by wound care.  If PureWick continues to leak, will have to find an alternative means of bladder emptying.    August 14, 2022: Patient is seen and examined at bedside.  Nurses in the room during this encounter as well.  Reports of 1 episode of leaking from the pure wick catheter that was placed yesterday after removal of Foley catheter.  Will keep an eye on this.  Where lift to be installed tomorrow, possible discharge in the afternoon or evening.    Some routine 2023: Patient is seen and examined at bedside.  Patient was asleep during this encounter, nurse at bedside.  Easily awakened today.  Slightly confused in the morning, as the day progressed improved mentation.  Alert and  oriented x 2-3.  Understands the plan of going home and waiting in the interim while financial issues are sorted out to get the patient placement for longer-term.  [facility] lift to be installed today, patient okay for discharge otherwise.      Anemia  Hemoglobin has been dropping from  From 12-10--8.2  I have ordered H&H monitor every 6 hours  Fecal occult blood test  Given patient has history of DVT I will continue the anticoagulation for now  Fecal occult blood test  Will monitor closely  No obvious bleeding  noted  11/29: So far H&H stable between 8-9, continue heparin drip, pending hematology evaluation) final recommendation, fecal occult blood test pending, patient had 1 bowel movement yesterday which is brown, no obvious bleeding noted  11/30: H&H stayed stable, patient switched back to Pradaxa per heme-onc recommendation, fecal occult blood test came positive, GI consulted to weigh in, started on PPI  12/1-12/5: Discussed with GI, recommended outpatient EGD colonoscopy and Protonix 40 mg twice daily for a month followed by daily, H&H remained stable in fact uptrending plan communicated with the patient as well as niece  08/09/2022: H&H largely stable, will continue following until the patient is discharged    Sepsis likely from UTI  Presented with leukocytosis and tachycardia  CT scan of the chest showed small left pleural effusion and evidence of pulmonary hypertension  UA consistent with UTI  Blood culture and urine culture came negative  Initially started on empirical broad-spectrum antibiotic vancomycin and Zosyn  MRSA test is negative  CT abdomen showed nonobstructive stone and small bladder stone  Antibiotic de-escalated to ceftriaxone  Leukocytosis improved  11/29-11/30: Patient will complete 5 days course of antibiotics today  08/09/2022: No symptoms of UTI, leukocytosis resolved for several days now        History of DVT  Patient had bilateral lower extremity DVT back in September 2023 currently on Pradaxa  Repeat venous ultrasound showed left femoral popliteal DVT extending to the left peroneal vein and posterior tibialis vein with associated mild soft tissue swelling/edema and short segment nonocclusive thrombus within the right common femoral vein  I will involve hematology to guide on treatment  11/29: CTA ruled out PE, for now we will continue heparin drip, will switch her back to [place] once hematology clears  11/30: Patient is a switch to Pradaxa after hematology cleared the patient  12/1-12/5: Patient  tolerated Pradaxa, outpatient follow-up with hematology  08/09/2022: Tolerating Pradaxa    NSTEMI  Elevated troponin  Likely demand ischemia  Troponin trended up from 90-108  Patient is on aspirin, statin and Pradaxa  11/29-12/5: 2D echo showed normal EF, no LV wall motion abnormality, elevated troponin is likely from demand ischemia and anemia, continue aspirin/statin/Coreg/losartan.  Outpatient follow-up with cardiology  08/09/2022: No chest pain        Chronic stable medical condition  Hypertension: Continue amlodipine/metoprolol/losartan  11/29-12/5: Blood pressure within acceptable range, outpatient follow-up with PCP     Type 2 diabetes  Carb controlled diet  Sliding scale  Hypoglycemia protocol  12/1-12/5: Increase Lantus to 10, continue sliding scale, and carb controlled diet.  12/5: Blood sugar low 200s, I have increased the Lantus to 12, continue to monitor POC    Morbid obesity  Outpatient follow-up     Bedbound  Wound on her left iliac area  Wound nurse consult       Procedures/Imaging:   Upon my review: Negative except discussed above  Progress Note/Physical Exam at Discharge     Subjective:Patient is stable for discharge.    Vitals:    08/15/22 0404 08/15/22 0712 08/15/22 1011 08/15/22 1120   BP: 97/63 110/69 109/67 105/63   Pulse: 82 83 85 82   Resp: '18 18  17   '$ Temp: 98.4 F (36.9 C) 98.1 F (36.7 C)  98.1 F (36.7 C)   TempSrc: Oral Oral  Oral   SpO2: 99% 97%  99%   Weight:  106.9 kg (235 lb 9.6 oz)     Height:               GEN APPEARANCE: Normal;  A&OX2, on room air  HEENT: Nonicteric  NECK: Supple; No bruits  CVS: RRR, S1, S2  LUNGS: Decreased breath sounds bilaterally with improvement towards apices, stable  ABD: Soft; no tenderness to palpation globally, positive bowel sounds in all quadrants  EXT: No edema;   NEURO: No new focal deficits noted               Diagnostics     Labs/Studies Pending at Discharge: Yes outpatient EGD and colonoscopy    Last Labs   Recent Labs   Lab  08/10/22  0317 08/09/22  0450   WBC  --  8.85   RBC  --  2.71*   Hgb 8.2* 8.4*   Hematocrit 26.8* 27.7*   MCV  --  102.2*   Platelets  --  336         Recent Labs   Lab 08/14/22  0434 08/13/22  0259 08/12/22  0440 08/11/22  0226 08/10/22  0317   Sodium 139 139 136 137 135   Potassium 4.4 4.2 4.2 4.4 4.3   Chloride 103 102 100 102 100   CO2 29 30* 31* 29 28   BUN 25.0* 23.0* 22.0* 22.0* 26.0*   Creatinine 0.8 0.8 0.8 0.8 0.8   Glucose 171* 235* 168* 259* 297*   Calcium 8.8 8.9 8.8 8.6 8.6   Magnesium 1.4* 1.5* 1.7 1.7 0.9*         Microbiology Results (last 15 days)       ** No results found for the last 360 hours. **             Patient Instructions   Discharge Diet: Heart healthy diet  Discharge Activity: As tolerated    Follow Up Appointment:   Follow-up Information       West Carbo, MD Follow up in 1 week(s).    Specialties: Gastroenterology, Internal Medicine  Contact information:  [place] [address]  [phone]               Tia Alert, MD Follow up in 1 week(s).    Specialties: Medical Oncology, Hematology  Contact information:  [address]  [place] [address]  [phone]               Vonzell Schlatter, MD Follow up in 1 week(s).    Specialties: Interventional Cardiology, Internal Medicine, Cardiology  Contact information:  [address] [address]  [phone]               Pcp, None, MD .                              Discharge Medications:     Medication  List        START taking these medications      aspirin 81 MG chewable tablet  Chew 1 tablet (81 mg) by mouth daily     atorvastatin 40 MG tablet  Commonly known as: LIPITOR  Take 1 tablet (40 mg) by mouth nightly     carboxymethylcellulose 0.5 % ophthalmic solution  Commonly known as: REFRESH TEARS  Place 1 drop into both eyes 3 (three) times daily as needed (dry eye)     insulin glargine 100 UNIT/ML injection  Commonly known as: LANTUS  Inject 12 Units into the skin nightly      pantoprazole 40 MG tablet  Commonly known as: PROTONIX  Take 1 tablet (40 mg) by mouth 2 (two) times daily for 30 days, THEN 1 tablet (40 mg) daily.  Start taking on: July 30, 2022            CONTINUE taking these medications      acetaminophen 500 MG tablet  Commonly known as: TYLENOL     CENTRUM SILVER 50+WOMEN PO     dabigatran 150 MG Caps  Commonly known as: PRADAXA     losartan 100 MG TABS, hydroCHLOROthiazide 25 MG TABS     metoprolol succinate 200 MG 24 hr tablet  Commonly known as: TOPROL-XL     TURMERIC PO     valACYclovir 1000 MG tablet  Commonly known as: VALTREX     vitamin D (ergocalciferol) 50000 UNIT Caps  Commonly known as: DRISDOL            STOP taking these medications      amLODIPine 5 MG tablet  Commonly known as: NORVASC     HUMULIN 70/30 KWIKPEN SC               Where to Get Your Medications        These medications were sent to [facility] - [address]  [address] [place] [address]      Phone: [phone]   aspirin 81 MG chewable tablet  atorvastatin 40 MG tablet  pantoprazole 40 MG tablet       Information about where to get these medications is not yet available    Ask your nurse or doctor about these medications  carboxymethylcellulose 0.5 % ophthalmic solution  insulin glargine 100 UNIT/ML injection          Time spent examining patient, discussing with patient/family regarding hospital course, chart review, reconciling medications and discharge planning: 40 minutes.    Signed,  Meredith Pel, MD    2:10 PM 08/15/2022

## 2022-08-15 NOTE — Progress Notes (Addendum)
PT D/C home with home health. The Pt will receive SCM for Two weeks with [facility] with a Aide 8 hours per day. The Pt has agreed to bridge herself to home based services once CM's SCM is completed.The PT's DME from [facility] will be delivered on 08/16/2022. Cm notified the pt's niece Ms. Willis about DCP. Ms. Jannifer Franklin has agreed and will be visiting the Pt on 08/15/2022 at 6:30 PM. Cm spoke with the Pt's apartment complex who is aware the Pt does not have her keys. The leasing office representative shared she is aware and will be letting the Pt into her unit. [facility] will be transporting the Pt home today at 1:30 PM on a stretcher. The Cm department has made several attempts to bridge this Pt to LTC however she has declined to share her financial information.        08/15/22 1156   Discharge Disposition   Patient preference/choice provided? Yes   Physical Discharge Disposition Home, Home Health   Mode of Transportation Other (comment)  ([facility] 1:30 PM)   Patient/Family/POA notified of transfer plan Yes   Patient agreeable to discharge plan/expected d/c date? Yes   Family/POA agreeable to discharge plan/expected d/c date? Yes   Bedside nurse notified of transport plan? Yes   CM Interventions   Multidisciplinary rounds/family meeting before d/c? Yes     Casey Burkitt, MSW, Supervisee in Social Work, Tourist information centre manager I

## 2022-08-15 NOTE — Discharge Summary -  Nursing (Signed)
Patient is being discharged home, spoke to patient's niece Ms. Willis about discharge.  Went over discharge education with Ms. Willis, explained to her patient has meds at the local [facility] in [place], [place].  Patient's niece stated that she is aware of that pharmacy and able to pick up the meds.  Ms. Jannifer Franklin is aware that patient will have 2 weeks/8 hr per day with [facility].  PIV out, a copy of discharge orders is being sent with patient to be given to Ms. Willis.  Ms. Jannifer Franklin said that she will leave to go to patient's house to get keys to let her in.  Patient's belongings are with her at bedside.

## 2022-08-15 NOTE — Progress Notes (Signed)
[facility]m stretcher transport is going to [id number] [place] [address] is set up for today at [id number] pm.     Cleaster Corin, CMS

## 2022-08-15 NOTE — Plan of Care (Addendum)
NURSING SHIFT NOTE     Patient: Meredith Baker  Day: 22      SHIFT EVENTS     Shift Narrative/Significant Events (PRN med administration, fall, RRT, etc.):   Pt is A&Ox3-4. A little drowsy and forgetful. Gets confused at times. Pt denies SOB, chest pain and dizziness. Pt refused some of her PM meds, documented and education provided. Wound care provided. Safety and fall precautions remain in place. Purposeful rounding completed.          ASSESSMENT     Changes in assessment from patient's baseline this shift:    Neuro: No  CV: No  Pulm: No  Peripheral Vascular: No  HEENT: No  GI: No  BM during shift: No, Last BM: Last BM Date: 08/13/22  GU: No   Integ: No  MS: No    Pain:   Pain Interventions:   Medications Utilized:     Mobility: PMP Activity: Step 3 - Bed Mobility of Distance Walked (ft) (Step 6,7): 0 Feet           Lines     Patient Lines/Drains/Airways Status       Active Lines, Drains and Airways       Name Placement date Placement time Site Days    Peripheral IV 08/14/22 22 G Posterior;Right Hand 08/14/22  2052  Hand  less than 1    External Urinary Catheter 08/13/22  1412  --  1                         VITAL SIGNS     Vitals:    08/14/22 2317   BP: 107/66   Pulse: 86   Resp: 17   Temp: 98.4 F (36.9 C)   SpO2: 98%       Temp  Min: 97.9 F (36.6 C)  Max: 99.5 F (37.5 C)  Pulse  Min: 70  Max: 95  Resp  Min: 17  Max: 19  BP  Min: 97/60  Max: 129/69  SpO2  Min: 94 %  Max: 98 %      Intake/Output Summary (Last 24 hours) at 08/15/2022 0158  Last data filed at 08/14/2022 2000  Gross per 24 hour   Intake 850 ml   Output 1751 ml   Net -901 ml            CARE PLAN         Problem: Compromised Tissue integrity  Goal: Damaged tissue is healing and protected  Outcome: Progressing  Flowsheets (Taken 08/15/2022 0157)  Damaged tissue is healing and protected:   Monitor/assess Braden scale every shift   Provide wound care per wound care algorithm   Reposition patient every 2 hours and as needed unless able to reposition  self   Avoid shearing injuries   Keep intact skin clean and dry   Use bath wipes, not soap and water, for daily bathing   Monitor patient's hygiene practices   Consult/collaborate with wound care nurse     Problem: Safety  Goal: Patient will be free from injury during hospitalization  Outcome: Progressing  Flowsheets (Taken 08/15/2022 0157)  Patient will be free from injury during hospitalization:   Assess patient's risk for falls and implement fall prevention plan of care per policy   Provide and maintain safe environment   Use appropriate transfer methods   Ensure appropriate safety devices are available at the bedside   Include patient/ family/ care giver in decisions  related to safety   Assess for patients risk for elopement and implement [place] per policy   Hourly rounding   Provide alternative method of communication if needed (communication boards, writing)  Goal: Patient will be free from infection during hospitalization  Outcome: Progressing  Flowsheets (Taken 08/15/2022 0157)  Free from Infection during hospitalization:   Assess and monitor for signs and symptoms of infection   Monitor lab/diagnostic results   Monitor all insertion sites (i.e. indwelling lines, tubes, urinary catheters, and drains)   Encourage patient and family to use good hand hygiene technique     Problem: Fluid and Electrolyte Imbalance/ Endocrine  Goal: Fluid and electrolyte balance are achieved/maintained  Outcome: Progressing  Flowsheets (Taken 08/12/2022 1702 by Vickii Penna, RN)  Fluid and electrolyte balance are achieved/maintained:   Monitor/assess lab values and report abnormal values   Assess and reassess fluid and electrolyte status   Monitor for muscle weakness     Problem: Hemodynamic Status: Cardiac  Goal: Stable vital signs and fluid balance  Outcome: Progressing  Flowsheets (Taken 08/15/2022 0157)  Stable vital signs and fluid balance:   Assess signs and symptoms associated with cardiac rhythm  changes   Monitor lab values     Problem: Bladder/Voiding  Goal: Patient will experience proper bladder emptying during admission and remain free from infection  Outcome: Progressing  Flowsheets (Taken 08/15/2022 0157)  Patient will experience proper bladder emptying during admission and remain free from infection:   Apply urinary containment device as appropriate and/or per order   Utilize bladder scans prior to or post void as appropriate   Encourage patient to identify medications that aid bladder function prior to administration   Encourage bladder emptying at regular intervals   Assess need for indwelling catheter every shift and discuss with LIP

## 2022-08-15 NOTE — Plan of Care (Signed)
Problem: Moderate/High Fall Risk Score >5  Goal: Patient will remain free of falls  Outcome: Progressing  Flowsheets (Taken 08/14/2022 2000 by Alfonse Alpers, RN)  High (Greater than 13):   HIGH-Consider use of low bed   HIGH-Initiate use of floor mats as appropriate   HIGH-Pharmacy to initiate evaluation and intervention per protocol   HIGH-Apply yellow "Fall Risk" arm band   HIGH-Utilize chair pad alarm for patient while in the chair   HIGH-Bed alarm on at all times while patient in bed   HIGH-Visual cue at entrance to patient's room

## 2022-08-15 NOTE — Progress Notes (Signed)
[facility] Case Management Progress Note:   Type of Services Provider Name   Provider Phone Number   Length of Need SCM approved by:  Comments:   [facility] "SNF"        Assisted Living        LTAC        Dialysis        Home Health        Infusion        DME        Medications        Transportation        Non-skilled Care ie [facility] [place]  [phone] 2 weeks 8 hours per day  [place] form completed: Yes    Financial assessment completed: Yes        Casey Burkitt, MSW, Supervisee In Social Work, Tourist information centre manager I

## 2022-12-28 DEATH — deceased
